# Patient Record
Sex: Male | Born: 1950
Health system: Southern US, Community
[De-identification: ages and names within clinical notes are randomized; demographics above are authoritative.]

## PROBLEM LIST (undated history)

## (undated) DIAGNOSIS — M109 Gout, unspecified: Secondary | ICD-10-CM

## (undated) DIAGNOSIS — F1910 Other psychoactive substance abuse, uncomplicated: Secondary | ICD-10-CM

## (undated) DIAGNOSIS — F32A Depression, unspecified: Secondary | ICD-10-CM

## (undated) DIAGNOSIS — G4730 Sleep apnea, unspecified: Secondary | ICD-10-CM

## (undated) DIAGNOSIS — I499 Cardiac arrhythmia, unspecified: Secondary | ICD-10-CM

## (undated) DIAGNOSIS — M1990 Unspecified osteoarthritis, unspecified site: Secondary | ICD-10-CM

## (undated) DIAGNOSIS — Z86010 Personal history of colonic polyps: Secondary | ICD-10-CM

## (undated) DIAGNOSIS — F329 Major depressive disorder, single episode, unspecified: Secondary | ICD-10-CM

## (undated) DIAGNOSIS — K7030 Alcoholic cirrhosis of liver without ascites: Secondary | ICD-10-CM

## (undated) DIAGNOSIS — E119 Type 2 diabetes mellitus without complications: Secondary | ICD-10-CM

## (undated) DIAGNOSIS — I10 Essential (primary) hypertension: Secondary | ICD-10-CM

## (undated) DIAGNOSIS — C801 Malignant (primary) neoplasm, unspecified: Secondary | ICD-10-CM

## (undated) DIAGNOSIS — T7840XA Allergy, unspecified, initial encounter: Secondary | ICD-10-CM

## (undated) DIAGNOSIS — R918 Other nonspecific abnormal finding of lung field: Secondary | ICD-10-CM

## (undated) DIAGNOSIS — I4891 Unspecified atrial fibrillation: Secondary | ICD-10-CM

## (undated) DIAGNOSIS — E7800 Pure hypercholesterolemia, unspecified: Secondary | ICD-10-CM

## (undated) HISTORY — DX: Other psychoactive substance abuse, uncomplicated: F19.10

## (undated) HISTORY — DX: Allergy, unspecified, initial encounter: T78.40XA

## (undated) HISTORY — DX: Essential (primary) hypertension: I10

## (undated) HISTORY — PX: CARPAL TUNNEL RELEASE: SHX101

## (undated) HISTORY — PX: COLONOSCOPY: SHX174

## (undated) HISTORY — PX: BUNIONECTOMY: SHX129

## (undated) HISTORY — DX: Unspecified osteoarthritis, unspecified site: M19.90

## (undated) HISTORY — DX: Pure hypercholesterolemia, unspecified: E78.00

## (undated) HISTORY — PX: ANKLE SURGERY: SHX546

## (undated) HISTORY — DX: Personal history of colonic polyps: Z86.010

## (undated) HISTORY — DX: Depression, unspecified: F32.A

## (undated) HISTORY — DX: Sleep apnea, unspecified: G47.30

## (undated) HISTORY — DX: Major depressive disorder, single episode, unspecified: F32.9

---
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
## 1989-04-12 HISTORY — PX: VASECTOMY: SHX75

## 2004-06-03 ENCOUNTER — Ambulatory Visit: Payer: Self-pay | Admitting: Internal Medicine

## 2004-09-24 ENCOUNTER — Ambulatory Visit: Payer: Self-pay | Admitting: Internal Medicine

## 2004-10-19 ENCOUNTER — Ambulatory Visit: Payer: Self-pay | Admitting: Internal Medicine

## 2004-12-17 ENCOUNTER — Ambulatory Visit: Payer: Self-pay | Admitting: Internal Medicine

## 2004-12-18 ENCOUNTER — Ambulatory Visit (HOSPITAL_BASED_OUTPATIENT_CLINIC_OR_DEPARTMENT_OTHER): Admission: RE | Admit: 2004-12-18 | Discharge: 2004-12-18 | Payer: Self-pay | Admitting: Internal Medicine

## 2004-12-22 ENCOUNTER — Ambulatory Visit: Payer: Self-pay | Admitting: Internal Medicine

## 2004-12-24 ENCOUNTER — Ambulatory Visit: Payer: Self-pay | Admitting: Pulmonary Disease

## 2004-12-29 ENCOUNTER — Ambulatory Visit: Payer: Self-pay | Admitting: Internal Medicine

## 2005-04-02 ENCOUNTER — Ambulatory Visit: Payer: Self-pay | Admitting: Internal Medicine

## 2006-03-15 ENCOUNTER — Ambulatory Visit: Payer: Self-pay | Admitting: Internal Medicine

## 2006-05-23 ENCOUNTER — Ambulatory Visit: Payer: Self-pay | Admitting: Internal Medicine

## 2006-09-27 ENCOUNTER — Ambulatory Visit: Payer: Self-pay | Admitting: Internal Medicine

## 2006-09-27 LAB — CONVERTED CEMR LAB
ALT: 24 units/L (ref 0–40)
AST: 21 units/L (ref 0–37)
Albumin: 4 g/dL (ref 3.5–5.2)
Alkaline Phosphatase: 59 units/L (ref 39–117)
BUN: 26 mg/dL — ABNORMAL HIGH (ref 6–23)
Basophils Absolute: 0 10*3/uL (ref 0.0–0.1)
Basophils Relative: 0.6 % (ref 0.0–1.0)
Bilirubin, Direct: 0.1 mg/dL (ref 0.0–0.3)
CO2: 30 meq/L (ref 19–32)
Calcium: 9.1 mg/dL (ref 8.4–10.5)
Chloride: 109 meq/L (ref 96–112)
Cholesterol: 202 mg/dL (ref 0–200)
Creatinine, Ser: 1 mg/dL (ref 0.4–1.5)
Direct LDL: 136.9 mg/dL
Eosinophils Absolute: 0.2 10*3/uL (ref 0.0–0.6)
Eosinophils Relative: 3 % (ref 0.0–5.0)
GFR calc Af Amer: 99 mL/min
GFR calc non Af Amer: 82 mL/min
Glucose, Bld: 96 mg/dL (ref 70–99)
HCT: 41.1 % (ref 39.0–52.0)
HDL: 45.4 mg/dL (ref >39.0)
Hemoglobin: 14.3 g/dL (ref 13.0–17.0)
Lymphocytes Relative: 25.4 % (ref 12.0–46.0)
MCHC: 34.8 g/dL (ref 30.0–36.0)
MCV: 90.2 fL (ref 78.0–100.0)
Monocytes Absolute: 0.7 10*3/uL (ref 0.2–0.7)
Monocytes Relative: 9.2 % (ref 3.0–11.0)
Neutro Abs: 5.1 10*3/uL (ref 1.4–7.7)
Neutrophils Relative %: 61.8 % (ref 43.0–77.0)
PSA: 1.78 ng/mL (ref 0.10–4.00)
Platelets: 177 10*3/uL (ref 150–400)
Potassium: 4.1 meq/L (ref 3.5–5.1)
RBC: 4.55 M/uL (ref 4.22–5.81)
RDW: 12.6 % (ref 11.5–14.6)
Sodium: 144 meq/L (ref 135–145)
TSH: 0.72 microintl units/mL (ref 0.35–5.50)
Total Bilirubin: 0.3 mg/dL (ref 0.3–1.2)
Total CHOL/HDL Ratio: 4.4
Total Protein: 6.6 g/dL (ref 6.0–8.3)
Triglycerides: 129 mg/dL (ref 0–149)
VLDL: 26 mg/dL (ref 0–40)
WBC: 8 10*3/uL (ref 4.5–10.5)

## 2006-10-11 ENCOUNTER — Ambulatory Visit: Payer: Self-pay | Admitting: Internal Medicine

## 2006-10-20 DIAGNOSIS — J309 Allergic rhinitis, unspecified: Secondary | ICD-10-CM | POA: Insufficient documentation

## 2006-10-20 DIAGNOSIS — G4730 Sleep apnea, unspecified: Secondary | ICD-10-CM | POA: Insufficient documentation

## 2006-10-21 ENCOUNTER — Ambulatory Visit: Payer: Self-pay | Admitting: Internal Medicine

## 2006-12-26 ENCOUNTER — Ambulatory Visit: Payer: Self-pay | Admitting: Internal Medicine

## 2006-12-26 DIAGNOSIS — F329 Major depressive disorder, single episode, unspecified: Secondary | ICD-10-CM | POA: Insufficient documentation

## 2006-12-28 ENCOUNTER — Ambulatory Visit: Payer: Self-pay | Admitting: Internal Medicine

## 2007-02-28 ENCOUNTER — Telehealth: Payer: Self-pay | Admitting: Internal Medicine

## 2007-11-03 ENCOUNTER — Ambulatory Visit: Payer: Self-pay | Admitting: Internal Medicine

## 2007-12-26 ENCOUNTER — Encounter: Payer: Self-pay | Admitting: Internal Medicine

## 2008-04-12 HISTORY — PX: KNEE ARTHROSCOPY: SHX127

## 2008-06-03 ENCOUNTER — Ambulatory Visit: Payer: Self-pay | Admitting: Internal Medicine

## 2008-06-03 DIAGNOSIS — I10 Essential (primary) hypertension: Secondary | ICD-10-CM | POA: Insufficient documentation

## 2008-06-13 ENCOUNTER — Emergency Department (HOSPITAL_COMMUNITY): Admission: EM | Admit: 2008-06-13 | Discharge: 2008-06-13 | Payer: Self-pay | Admitting: Emergency Medicine

## 2008-06-13 ENCOUNTER — Encounter: Payer: Self-pay | Admitting: Internal Medicine

## 2008-06-13 ENCOUNTER — Inpatient Hospital Stay (HOSPITAL_COMMUNITY): Admission: EM | Admit: 2008-06-13 | Discharge: 2008-06-14 | Payer: Self-pay | Admitting: Family Medicine

## 2008-06-24 ENCOUNTER — Encounter (HOSPITAL_COMMUNITY): Admission: RE | Admit: 2008-06-24 | Discharge: 2008-07-08 | Payer: Self-pay | Admitting: Urology

## 2008-07-25 ENCOUNTER — Encounter: Payer: Self-pay | Admitting: Internal Medicine

## 2008-07-31 ENCOUNTER — Telehealth (INDEPENDENT_AMBULATORY_CARE_PROVIDER_SITE_OTHER): Payer: Self-pay | Admitting: *Deleted

## 2008-08-08 ENCOUNTER — Ambulatory Visit: Payer: Self-pay | Admitting: Internal Medicine

## 2008-08-09 LAB — CONVERTED CEMR LAB
ALT: 29 units/L (ref 0–53)
Bilirubin, Direct: 0.1 mg/dL (ref 0.0–0.3)
Total Bilirubin: 0.9 mg/dL (ref 0.3–1.2)
Total Protein: 7.3 g/dL (ref 6.0–8.3)

## 2009-02-13 ENCOUNTER — Telehealth: Payer: Self-pay | Admitting: Internal Medicine

## 2009-02-14 ENCOUNTER — Ambulatory Visit: Payer: Self-pay | Admitting: Internal Medicine

## 2009-02-14 LAB — CONVERTED CEMR LAB
BUN: 18 mg/dL (ref 6–23)
Creatinine, Ser: 1 mg/dL (ref 0.4–1.5)

## 2009-02-18 ENCOUNTER — Ambulatory Visit: Payer: Self-pay | Admitting: Cardiology

## 2009-02-24 ENCOUNTER — Telehealth: Payer: Self-pay | Admitting: Internal Medicine

## 2009-06-04 ENCOUNTER — Ambulatory Visit: Payer: Self-pay | Admitting: Internal Medicine

## 2009-06-04 DIAGNOSIS — R5381 Other malaise: Secondary | ICD-10-CM

## 2009-06-04 DIAGNOSIS — R5383 Other fatigue: Secondary | ICD-10-CM

## 2009-06-11 LAB — CONVERTED CEMR LAB
Alkaline Phosphatase: 67 units/L (ref 39–117)
Basophils Absolute: 0 10*3/uL (ref 0.0–0.1)
Basophils Relative: 0.5 % (ref 0.0–3.0)
Bilirubin, Direct: 0.2 mg/dL (ref 0.0–0.3)
CO2: 32 meq/L (ref 19–32)
Calcium: 9.5 mg/dL (ref 8.4–10.5)
Cholesterol: 224 mg/dL — ABNORMAL HIGH (ref 0–200)
Creatinine, Ser: 1.1 mg/dL (ref 0.4–1.5)
Direct LDL: 150.6 mg/dL
Eosinophils Absolute: 0.1 10*3/uL (ref 0.0–0.7)
HCT: 42 % (ref 39.0–52.0)
Hemoglobin: 14.1 g/dL (ref 13.0–17.0)
Lymphs Abs: 1.6 10*3/uL (ref 0.7–4.0)
MCHC: 33.5 g/dL (ref 30.0–36.0)
MCV: 90.8 fL (ref 78.0–100.0)
Monocytes Absolute: 0.5 10*3/uL (ref 0.1–1.0)
Neutro Abs: 3.5 10*3/uL (ref 1.4–7.7)
RBC: 4.63 M/uL (ref 4.22–5.81)
RDW: 12 % (ref 11.5–14.6)
Sodium: 144 meq/L (ref 135–145)
Total Bilirubin: 0.5 mg/dL (ref 0.3–1.2)
Total CHOL/HDL Ratio: 4
Total Protein: 7.6 g/dL (ref 6.0–8.3)
VLDL: 41 mg/dL — ABNORMAL HIGH (ref 0.0–40.0)

## 2009-06-13 ENCOUNTER — Ambulatory Visit: Payer: Self-pay | Admitting: Internal Medicine

## 2009-06-13 DIAGNOSIS — E291 Testicular hypofunction: Secondary | ICD-10-CM

## 2009-06-13 LAB — CONVERTED CEMR LAB
FSH: 4.8 milliintl units/mL (ref 1.4–18.1)
LH: 3.5 milliintl units/mL (ref 1.5–9.3)
Prolactin: 4.5 ng/mL (ref 2.1–17.1)

## 2009-07-10 ENCOUNTER — Telehealth: Payer: Self-pay | Admitting: Internal Medicine

## 2009-07-21 ENCOUNTER — Ambulatory Visit: Payer: Self-pay | Admitting: Internal Medicine

## 2009-08-19 ENCOUNTER — Ambulatory Visit: Payer: Self-pay | Admitting: Internal Medicine

## 2009-09-22 ENCOUNTER — Telehealth: Payer: Self-pay | Admitting: *Deleted

## 2009-09-25 ENCOUNTER — Ambulatory Visit: Payer: Self-pay | Admitting: Internal Medicine

## 2009-10-24 ENCOUNTER — Ambulatory Visit: Payer: Self-pay | Admitting: Internal Medicine

## 2009-11-04 ENCOUNTER — Telehealth: Payer: Self-pay | Admitting: Internal Medicine

## 2009-11-05 ENCOUNTER — Telehealth (INDEPENDENT_AMBULATORY_CARE_PROVIDER_SITE_OTHER): Payer: Self-pay | Admitting: *Deleted

## 2010-01-02 ENCOUNTER — Ambulatory Visit: Payer: Self-pay | Admitting: Internal Medicine

## 2010-02-03 ENCOUNTER — Ambulatory Visit: Payer: Self-pay | Admitting: Internal Medicine

## 2010-04-20 ENCOUNTER — Ambulatory Visit (HOSPITAL_COMMUNITY)
Admission: RE | Admit: 2010-04-20 | Discharge: 2010-04-20 | Payer: Self-pay | Source: Home / Self Care | Attending: Internal Medicine | Admitting: Internal Medicine

## 2010-05-04 ENCOUNTER — Encounter: Payer: Self-pay | Admitting: Internal Medicine

## 2010-05-12 NOTE — Assessment & Plan Note (Signed)
Summary: consult re: fatigue and depression med/cjr   Vital Signs:  Patient profile:   60 year old male Weight:      211 pounds Temp:     98.9 degrees F oral Pulse rate:   72 / minute BP sitting:   120 / 80  (left arm) Cuff size:   regular  Vitals Entered By: Romualdo Bolk, CMA (AAMA) (January 02, 2010 8:30 AM) CC: Consult on fatigue    CC:  Consult on fatigue .  History of Present Illness: fatigue says he is tired---working 2nd and 3rd shifts. 12 hours daily 5-6 days/week Has OSA has no vacation and feels too fatigued to work  All other systems reviewed and were negative   Preventive Screening-Counseling & Management  Alcohol-Tobacco     Alcohol drinks/day: 4     Smoking Status: quit     Year Quit: 1985  Current Medications (verified): 1)  Cialis 20 Mg Tabs (Tadalafil) .... Take 1/2 - 1 Once Daily As Directed As Needed 2)  Citalopram Hydrobromide 20 Mg Tabs (Citalopram Hydrobromide) .... Take 1/ Tablet By Mouth Once A Day 3)  Wellbutrin Sr 150 Mg Tb12 (Bupropion Hcl) .... Take 1 Tablet By Mouth Twice A Day 4)  Fluticasone Propionate 50 Mcg/act Susp (Fluticasone Propionate) .... Inhale 1 Spray Into Each Nostril Once Daily. 5)  Fexofenadine Hcl 180 Mg Tabs (Fexofenadine Hcl) .... Take 1 Tablet By Mouth Once A Day 6)  Aspirin Adult Low Strength 81 Mg Tbec (Aspirin) .... One By Mouth Daily 7)  Lisinopril 20 Mg Tabs (Lisinopril) .Marland Kitchen.. 1 Tablet By Mouth Daily 8)  Testosterone Enanthate 200 Mg/ml Oil (Testosterone Enanthate) .... Inject 200 Mg. Monthly  Allergies (verified): No Known Drug Allergies  Physical Exam  General:  alert and well-developed.   Psych:  normally interactive and good eye contact.     Impression & Recommendations:  Problem # 1:  FATIGUE (ICD-780.79) ongoing will write out of work for 7 days RTW 01/12/10 If sxs persist will need furhter eval or possibly psych referral  Problem # 2:  HYPERTENSION (ICD-401.9)  His updated medication list  for this problem includes:    Lisinopril 20 Mg Tabs (Lisinopril) .Marland Kitchen... 1 tablet by mouth daily  BP today: 120/80 Prior BP: 144/80 (06/04/2009)  Labs Reviewed: K+: 4.4 (06/04/2009) Creat: : 1.1 (06/04/2009)   Chol: 224 (06/04/2009)   HDL: 58.80 (06/04/2009)   LDL: DEL (09/27/2006)   TG: 205.0 (06/04/2009)  Complete Medication List: 1)  Cialis 20 Mg Tabs (Tadalafil) .... Take 1/2 - 1 once daily as directed as needed 2)  Citalopram Hydrobromide 20 Mg Tabs (Citalopram hydrobromide) .... Take 1/ tablet by mouth once a day 3)  Wellbutrin Sr 150 Mg Tb12 (Bupropion hcl) .... Take 1 tablet by mouth twice a day 4)  Fluticasone Propionate 50 Mcg/act Susp (Fluticasone propionate) .... Inhale 1 spray into each nostril once daily. 5)  Fexofenadine Hcl 180 Mg Tabs (Fexofenadine hcl) .... Take 1 tablet by mouth once a day 6)  Aspirin Adult Low Strength 81 Mg Tbec (Aspirin) .... One by mouth daily 7)  Lisinopril 20 Mg Tabs (Lisinopril) .Marland Kitchen.. 1 tablet by mouth daily 8)  Testosterone Enanthate 200 Mg/ml Oil (Testosterone enanthate) .... Inject 200 mg. monthly

## 2010-05-12 NOTE — Progress Notes (Signed)
Summary: refills citalopram  Phone Note Call from Patient Call back at [facility] Phone [phone]   Summary of Call: 30 day Rx Citalopram 20 mg one daily to [facility] and written Rx 90 day to send to mailorder to pickup Thurs when he comes for injection.   Initial call taken by: Rudy Jew, RN,  September 22, 2009 12:16 PM  Follow-up for Phone Call        List has 1/2 tab once daily since 05/2009 visit.  Pt states that did not work for him so has gone back to Take 1 tablet by mouth once a day with wellbutrin Take 1 tablet by mouth two times a day .  Wants #46mcalled to [facility] and will pick up Rx for mail order of citalopram on Thurs when gets injection.  Follow-up by: Gladis Riffle, RN,  September 22, 2009 2:10 PM  Additional Follow-up for Phone Call Additional follow up Details #1::        see Rx.  Rx done [facility] was withdrawn via phone call to pharmacist. Additional Follow-up by: Gladis Riffle, RN,  September 22, 2009 2:13 PM    Additional Follow-up for Phone Call Additional follow up Details #2::    Rx ready for pick up. Follow-up by: Gladis Riffle, RN,  September 23, 2009 8:03 AM  New/Updated Medications: CITALOPRAM HYDROBROMIDE 20 MG TABS (CITALOPRAM HYDROBROMIDE) Take 1/ tablet by mouth once a day Prescriptions: CITALOPRAM HYDROBROMIDE 20 MG TABS (CITALOPRAM HYDROBROMIDE) Take 1/ tablet by mouth once a day  #90 x 3   Entered by:   Gladis Riffle, RN   Authorized by:   Birdie Sons MD   Signed by:   Gladis Riffle, RN on 09/22/2009   Method used:   Print then Give to Patient   RxID:   [id number] CITALOPRAM HYDROBROMIDE 20 MG TABS (CITALOPRAM HYDROBROMIDE) Take 1/ tablet by mouth once a day  #30 x 0   Entered by:   Gladis Riffle, RN   Authorized by:   Birdie Sons MD   Signed by:   Gladis Riffle, RN on 09/22/2009   Method used:   Electronically to        [facility]* (retail)       [address] [place], [place]  [address]      Ph: [phone]       Fax: [phone]   RxID:   [id number]

## 2010-05-12 NOTE — Progress Notes (Signed)
Summary: citalopram refill  Prescriptions: CITALOPRAM HYDROBROMIDE 20 MG TABS (CITALOPRAM HYDROBROMIDE) Take 1/ tablet by mouth once a day  #90 x 1   Entered by:   Kern Reap CMA (AAMA)   Authorized by:   Birdie Sons MD   Signed by:   Kern Reap CMA (AAMA) on 11/04/2009   Method used:   Electronically to        [facility]* (retail)       [address]       [place], [place]  [address]       Ph: [phone]       Fax: [phone]   RxID:   [id number]

## 2010-05-12 NOTE — Progress Notes (Signed)
Summary: Written Rxs for sig tom 7-28  Phone Note Call from Patient Call back at [facility] Phone [phone]   Summary of Call: Needs 2 written Rxs to mail off himself to [facility] drug company.  1)Lisinopril 20 mg one daily 2) generic Wellbutrin 150mg  one two times a day.  Please call him when he can pick them up.   Initial call taken by: Rudy Jew, RN,  November 05, 2009 1:23 PM  Follow-up for Phone Call        Rxs available tomorrow for Dr. Cato Mulligan to sign.  Tore up ones with nurse's name.   Follow-up by: Rudy Jew, RN,  November 05, 2009 1:32 PM  Additional Follow-up for Phone Call Additional follow up Details #1::        Fleet Contras left message that the prescrition is not generic.  Additional Follow-up by: Lynann Beaver CMA,  November 06, 2009 11:13 AM    Additional Follow-up for Phone Call Additional follow up Details #2::    left message on machine for patient that rx has been mailed to the home address. Follow-up by: Kern Reap CMA Duncan Dull),  November 07, 2009 12:55 PM  Prescriptions: LISINOPRIL 20 MG TABS (LISINOPRIL) 1 tablet by mouth daily  #90 Tablet x 3   Entered by:   Rudy Jew, RN   Authorized by:   Birdie Sons MD   Signed by:   Rudy Jew, RN on 11/05/2009   Method used:   Print then Give to Patient   RxID:   [id number] WELLBUTRIN SR 150 MG TB12 (BUPROPION HCL) Take 1 tablet by mouth twice a day  #180 Tablet x 3   Entered by:   Rudy Jew, RN   Authorized by:   Birdie Sons MD   Signed by:   Rudy Jew, RN on 11/05/2009   Method used:   Print then Give to Patient   RxID:   [id number] LISINOPRIL 20 MG TABS (LISINOPRIL) 1 tablet by mouth daily  #90 Tablet x 3   Entered and Authorized by:   Rudy Jew, RN   Signed by:   Rudy Jew, RN on 11/05/2009   Method used:   Print then Give to Patient   RxID:   [id number] WELLBUTRIN SR 150 MG TB12 (BUPROPION HCL) Take 1 tablet by mouth twice a day   #180 Tablet x 3   Entered and Authorized by:   Rudy Jew, RN   Signed by:   Rudy Jew, RN on 11/05/2009   Method used:   Print then Give to Patient   RxID:   [id number]

## 2010-05-12 NOTE — Assessment & Plan Note (Signed)
Summary: testosterone inj/pt coming in at 4pm per Ellen/cjr  Nurse Visit   Allergies: No Known Drug Allergies  Medication Administration  Injection # 1:    Medication: Testosterone Cypionat 200mg  ing    Diagnosis: TESTICULAR HYPOFUNCTION (ICD-257.2)    Route: IM    Site: RUOQ gluteus    Exp Date: 02/11/2011    Lot #: [id number]    Mfr: paddock    Patient tolerated injection without complications    Given by: Gladis Riffle, RN (July 21, 2009 4:18 PM)  Orders Added: 1)  Admin of patients own med IM/SQ [96372M]   Medication Administration  Injection # 1:    Medication: Testosterone Cypionat 200mg  ing    Diagnosis: TESTICULAR HYPOFUNCTION (ICD-257.2)    Route: IM    Site: RUOQ gluteus    Exp Date: 02/11/2011    Lot #: [id number]    Mfr: paddock    Patient tolerated injection without complications    Given by: Gladis Riffle, RN (July 21, 2009 4:18 PM)  Orders Added: 1)  Admin of patients own med IM/SQ [phone]

## 2010-05-12 NOTE — Assessment & Plan Note (Signed)
Summary: med check-testosterone check//ccm   Vital Signs:  Patient profile:   60 year old male Weight:      214 pounds Temp:     98.3 degrees F oral Pulse rate:   68 / minute Resp:     14 per minute BP sitting:   144 / 80  (left arm) Cuff size:   regular  Vitals Entered By: Gladis Riffle, RN (June 04, 2009 11:13 AM)  Serial Vital Signs/Assessments:  Time      Position  BP       Pulse  Resp  Temp     By                     148/94                         Birdie Sons MD  CC: med review--discuss depression meds and testosterone--c/o falling asleep during the day  Is Patient Diabetic? No Comments BP 150/100 at urgent care the other day   CC:  med review--discuss depression meds and testosterone--c/o falling asleep during the day .  History of Present Illness: UCC---BP 150/100 no sxs of htn---tolerating BP meds no CP,SOB,PND  hypersomnia---he has sleep apnea---uses CPAP some of the time (says hypersomnia resulted in him being laid off).   MOod---he admits to depressed mood---tried some lexapro---seemed to help (note already on citalopram)  fatigue---he wonders about having testosterone checked---hx of testosterone deficiency  All other systems reviewed and were negative    Preventive Screening-Counseling & Management  Alcohol-Tobacco     Alcohol drinks/day: 4     Smoking Status: quit     Year Quit: 1985  Current Problems (verified): 1)  Fatigue  (ICD-780.79) 2)  Hypertension  (ICD-401.9) 3)  Depression  (ICD-311) 4)  Sleep Apnea  (ICD-780.57) 5)  Allergic Rhinitis  (ICD-477.9)  Medications Prior to Update: 1)  Cialis 20 Mg Tabs (Tadalafil) .... Take 1/2 - 1 Once Daily As Directed As Needed 2)  Citalopram Hydrobromide 20 Mg Tabs (Citalopram Hydrobromide) .... Take 1 Tablet By Mouth Once A Day 3)  Wellbutrin Sr 150 Mg Tb12 (Bupropion Hcl) .... Take 1 Tablet By Mouth Twice A Day 4)  Fluticasone Propionate 50 Mcg/act Susp (Fluticasone Propionate) .... Inhale 1  Spray Into Each Nostril Once Daily. 5)  Fexofenadine Hcl 180 Mg Tabs (Fexofenadine Hcl) .... Take 1 Tablet By Mouth Once A Day 6)  Aspirin Adult Low Strength 81 Mg Tbec (Aspirin) .... One By Mouth Daily 7)  Lisinopril 10 Mg  Tabs (Lisinopril) .... Take 1 Tab By Mouth Daily  Current Medications (verified): 1)  Cialis 20 Mg Tabs (Tadalafil) .... Take 1/2 - 1 Once Daily As Directed As Needed 2)  Citalopram Hydrobromide 20 Mg Tabs (Citalopram Hydrobromide) .... Take 1/2  Tablet By Mouth Once A Day 3)  Wellbutrin Sr 150 Mg Tb12 (Bupropion Hcl) .... Take 1 Tablet By Mouth Twice A Day 4)  Fluticasone Propionate 50 Mcg/act Susp (Fluticasone Propionate) .... Inhale 1 Spray Into Each Nostril Once Daily. 5)  Fexofenadine Hcl 180 Mg Tabs (Fexofenadine Hcl) .... Take 1 Tablet By Mouth Once A Day 6)  Aspirin Adult Low Strength 81 Mg Tbec (Aspirin) .... One By Mouth Daily 7)  Lisinopril 10 Mg  Tabs (Lisinopril) .... Take 1 Tab By Mouth Daily  Allergies (verified): No Known Drug Allergies  Past History:  Past Medical History: Last updated: 06/03/2008 Allergic rhinitis mood disorder OSA-CPAP Hep A &  B series complete 2005 per pt Depression Hypertension  Past Surgical History: Last updated: 10/20/2006 Tonsillectomy, adenoidectomy  as child Vasectomy  Family History: Last updated: 06/03/2008 father---HTN Family History Hypertension mother--no problems at age 73, breast and stomach CA brother pancreatic CA  Social History: Last updated: 08/08/2008 Married Former Smoker--quit 1985 Alcohol use-yes  Risk Factors: Alcohol Use: 4 (06/04/2009)  Risk Factors: Smoking Status: quit (06/04/2009)  Physical Exam  General:  Well-developed,well-nourished,in no acute distress; alert,appropriate and cooperative throughout examination Eyes:  pupils equal and pupils round.   Neck:  No deformities, masses, or tenderness noted. Lungs:  Normal respiratory effort, chest expands symmetrically. Lungs  are clear to auscultation, no crackles or wheezes. Heart:  normal rate and regular rhythm.   Abdomen:  Bowel sounds positive,abdomen soft and non-tender without masses, organomegaly or hernias noted. Msk:  No deformity or scoliosis noted of thoracic or lumbar spine.   Neurologic:  alert & oriented X3 and gait normal.   Skin:  turgor normal and color normal.   Psych:  memory intact for recent and remote and good eye contact.     Impression & Recommendations:  Problem # 1:  HYPERTENSION (ICD-401.9) i've asked him to monitor goal BP < 135/85 His updated medication list for this problem includes:    Lisinopril 20 Mg Tabs (Lisinopril) .Marland Kitchen... 1 tablet by mouth daily  Orders: TLB-Lipid Panel (80061-LIPID)  BP today: 144/80 Prior BP: 156/82 (08/08/2008)  Labs Reviewed: K+: 4.1 (09/27/2006) Creat: : 1.0 (02/14/2009)   Chol: 202 (09/27/2006)   HDL: 45.4 (09/27/2006)   LDL: DEL (09/27/2006)   TG: 129 (09/27/2006)  Problem # 2:  SLEEP APNEA (ICD-780.57) not tolerating mask terribly well Orders: Pulmonary Referral (Pulmonary)  Problem # 3:  DEPRESSION (ICD-311)  His updated medication list for this problem includes:    Citalopram Hydrobromide 20 Mg Tabs (Citalopram hydrobromide) .Marland Kitchen... Take 1/2  tablet by mouth once a day    Wellbutrin Sr 150 Mg Tb12 (Bupropion hcl) .Marland Kitchen... Take 1 tablet by mouth twice a day  Problem # 4:  FATIGUE (ICD-780.79) needs further evaluation Orders: Venipuncture ([address]) Sedimentation Rate, non-automated (60[address]) TLB-BMP (Basic Metabolic Panel-BMET) (80048-METABOL) TLB-Hepatic/Liver Function Pnl (80076-HEPATIC) TLB-CBC Platelet - w/Differential (85025-CBCD) TLB-TSH (Thyroid Stimulating Hormone) (84443-TSH) TLB-Testosterone, Total (84403-TESTO)  Complete Medication List: 1)  Cialis 20 Mg Tabs (Tadalafil) .... Take 1/2 - 1 once daily as directed as needed 2)  Citalopram Hydrobromide 20 Mg Tabs (Citalopram hydrobromide) .... Take 1/2  tablet by mouth once a  day 3)  Wellbutrin Sr 150 Mg Tb12 (Bupropion hcl) .... Take 1 tablet by mouth twice a day 4)  Fluticasone Propionate 50 Mcg/act Susp (Fluticasone propionate) .... Inhale 1 spray into each nostril once daily. 5)  Fexofenadine Hcl 180 Mg Tabs (Fexofenadine hcl) .... Take 1 tablet by mouth once a day 6)  Aspirin Adult Low Strength 81 Mg Tbec (Aspirin) .... One by mouth daily 7)  Lisinopril 20 Mg Tabs (Lisinopril) .Marland Kitchen.. 1 tablet by mouth daily Prescriptions: LISINOPRIL 20 MG TABS (LISINOPRIL) 1 tablet by mouth daily  #90 x 3   Entered and Authorized by:   Birdie Sons MD   Signed by:   Birdie Sons MD on 06/04/2009   Method used:   Electronically to        [facility] [phone]* (retail)       [address]       [place], [place]  [address]  Ph: [phone] or [phone]       Fax: [phone]   RxID:   [id number]   Laboratory Results   Blood Tests     SED rate: 10 mm/hr  Comments: Rita Ohara  June 04, 2009 2:40 PM

## 2010-05-12 NOTE — Assessment & Plan Note (Signed)
Summary: TINGLING FEELING - BILATERAL HANDS // RS   Vital Signs:  Patient profile:   60 year old male Weight:      208 pounds Temp:     98.5 degrees F oral BP sitting:   148 / 94  (left arm) Cuff size:   large  Vitals Entered By: Alfred Levins, CMA (February 03, 2010 11:28 AM) CC: chronic rt arm pain and numbness   CC:  chronic rt arm pain and numbness.  History of Present Illness: Right hand/arm discomfort hand and arm tingles and is uncomfortable time.  occasionally has a "lump sensation" right axilla  Sxs are most bothersome at night, but will have sxs with driving.  Sxs sometimes interfere with his ability to "feel" small objects  occasionally notes some neck pain  All other systems reviewed and were negative except chronic hip and knee pain--unchanged  Current Medications (verified): 1)  Cialis 20 Mg Tabs (Tadalafil) .... Take 1/2 - 1 Once Daily As Directed As Needed 2)  Citalopram Hydrobromide 20 Mg Tabs (Citalopram Hydrobromide) .... Take 1/ Tablet By Mouth Once A Day 3)  Wellbutrin Sr 150 Mg Tb12 (Bupropion Hcl) .... Take 1 Tablet By Mouth Twice A Day 4)  Fluticasone Propionate 50 Mcg/act Susp (Fluticasone Propionate) .... Inhale 1 Spray Into Each Nostril Once Daily. 5)  Fexofenadine Hcl 180 Mg Tabs (Fexofenadine Hcl) .... Take 1 Tablet By Mouth Once A Day 6)  Aspirin Adult Low Strength 81 Mg Tbec (Aspirin) .... One By Mouth Daily 7)  Lisinopril 20 Mg Tabs (Lisinopril) .Marland Kitchen.. 1 Tablet By Mouth Daily  Allergies (verified): No Known Drug Allergies  Past History:  Past Medical History: Last updated: 06/03/2008 Allergic rhinitis mood disorder OSA-CPAP Hep A & B series complete 2005 per pt Depression Hypertension  Past Surgical History: Last updated: 10/20/2006 Tonsillectomy, adenoidectomy  as child Vasectomy  Family History: Last updated: 06/03/2008 father---HTN Family History Hypertension mother--no problems at age 64, breast and stomach CA brother  pancreatic CA  Social History: Last updated: 08/08/2008 Married Former Smoker--quit 1985 Alcohol use-yes  Risk Factors: Alcohol Use: 4 (01/02/2010)  Risk Factors: Smoking Status: quit (01/02/2010)  Physical Exam  General:  well-developed well-nourished male in no acute distress. HEENT exam atraumatic, normocephalic. Chest heart auscultation cardiac exam S1-S2 are regular extremities there is no clubbing cyanosis or edema. Tinel's and Phalen's signs are negative.   Impression & Recommendations:  Problem # 1:  R/O CARPAL TUNNEL SYNDROME, RIGHT (ICD-354.0) wrist brace if no success than NCS  Complete Medication List: 1)  Cialis 20 Mg Tabs (Tadalafil) .... Take 1/2 - 1 once daily as directed as needed 2)  Citalopram Hydrobromide 20 Mg Tabs (Citalopram hydrobromide) .... Take 1/ tablet by mouth once a day 3)  Wellbutrin Sr 150 Mg Tb12 (Bupropion hcl) .... Take 1 tablet by mouth twice a day 4)  Fluticasone Propionate 50 Mcg/act Susp (Fluticasone propionate) .... Inhale 1 spray into each nostril once daily. 5)  Fexofenadine Hcl 180 Mg Tabs (Fexofenadine hcl) .... Take 1 tablet by mouth once a day 6)  Aspirin Adult Low Strength 81 Mg Tbec (Aspirin) .... One by mouth daily 7)  Lisinopril 20 Mg Tabs (Lisinopril) .Marland Kitchen.. 1 tablet by mouth daily   Orders Added: 1)  Est. Patient Level III [[address]]

## 2010-05-12 NOTE — Progress Notes (Signed)
Summary: results of testosteron  Phone Note Call from Patient   Caller: Patient Call For: Birdie Sons MD Summary of Call: Pt wants to know if he is going to have any treatment for his low testosterone??? [phone] Initial call taken by: Lynann Beaver CMA,  July 10, 2009 12:48 PM  Follow-up for Phone Call        yes , I'k like him to start with injections testosterone 200mg  im monthly (call in rx)  see me 4 months Follow-up by: Birdie Sons MD,  July 10, 2009 12:55 PM  Additional Follow-up for Phone Call Additional follow up Details #1::        LMTCB Additional Follow-up by: Lynann Beaver CMA,  July 10, 2009 1:34 PM    New/Updated Medications: TESTOSTERONE ENANTHATE 200 MG/ML OIL (TESTOSTERONE ENANTHATE) Inject 200 mg. monthly Prescriptions: TESTOSTERONE ENANTHATE 200 MG/ML OIL (TESTOSTERONE ENANTHATE) Inject 200 mg. monthly  #11ml x 2   Entered by:   Lynann Beaver CMA   Authorized by:   Birdie Sons MD   Signed by:   Lynann Beaver CMA on 07/10/2009   Method used:   Telephoned to ...       [facility]* (retail)       [address]       [place], [place]  [address]       Ph: [phone]       Fax: [phone]   RxID:   [id number]  Pt. notified.

## 2010-05-12 NOTE — Assessment & Plan Note (Signed)
  Nurse Visit   Allergies: No Known Drug Allergies  Medication Administration  Injection # 1:    Medication: Testosterone Cypionat 200mg  ing    Diagnosis: TESTICULAR HYPOFUNCTION (ICD-257.2)    Route: IM    Site: R deltoid    Exp Date: 03/2011    Lot #: [id number]    Mfr: [email or web address]    Comments: PT BROUGHT IN HIS OWN MED    Patient tolerated injection without complications    Given by: Josph Macho RMA (October 24, 2009 2:09 PM)  Orders Added: 1)  Admin of Therapeutic Inj  intramuscular or subcutaneous [26948]

## 2010-05-12 NOTE — Assessment & Plan Note (Signed)
Summary: testosterone inj/et/pt rsc from bmp/cjr  Nurse Visit   Allergies: No Known Drug Allergies  Medication Administration  Injection # 1:    Medication: Testosterone Cypionat 200mg  ing    Diagnosis: TESTICULAR HYPOFUNCTION (ICD-257.2)    Route: IM    Exp Date: 02/11/2011    Lot #: [id number]    Mfr: paddock    Patient tolerated injection without complications    Given by: Gladis Riffle, RN (September 25, 2009 1:51 PM)  Orders Added: 1)  Admin of patients own med IM/SQ [96372M]   Medication Administration  Injection # 1:    Medication: Testosterone Cypionat 200mg  ing    Diagnosis: TESTICULAR HYPOFUNCTION (ICD-257.2)    Route: IM    Exp Date: 02/11/2011    Lot #: [id number]    Mfr: paddock    Patient tolerated injection without complications    Given by: Gladis Riffle, RN (September 25, 2009 1:51 PM)  Orders Added: 1)  Admin of patients own med IM/SQ [phone]

## 2010-05-12 NOTE — Assessment & Plan Note (Signed)
Summary: TESTOSTERONE INJ // RS  Nurse Visit   Allergies: No Known Drug Allergies  Medication Administration  Injection # 1:    Medication: Testosterone Cypionat 200mg  ing    Diagnosis: TESTICULAR HYPOFUNCTION (ICD-257.2)    Route: IM    Site: RUOQ gluteus    Exp Date: 03/13/2011    Lot #: [id number]    Mfr: Gaylyn Rong    Patient tolerated injection without complications    Given by: Gladis Riffle, RN (Aug 19, 2009 1:46 PM)  Orders Added: 1)  Admin of patients own med IM/SQ [96372M]   Medication Administration  Injection # 1:    Medication: Testosterone Cypionat 200mg  ing    Diagnosis: TESTICULAR HYPOFUNCTION (ICD-257.2)    Route: IM    Site: RUOQ gluteus    Exp Date: 03/13/2011    Lot #: [id number]    Mfr: Gaylyn Rong    Patient tolerated injection without complications    Given by: Gladis Riffle, RN (Aug 19, 2009 1:46 PM)  Orders Added: 1)  Admin of patients own med IM/SQ [phone]

## 2010-07-23 LAB — CBC
MCHC: 35.2 g/dL (ref 30.0–36.0)
MCV: 90.3 fL (ref 78.0–100.0)
Platelets: 155 10*3/uL (ref 150–400)
WBC: 7.2 10*3/uL (ref 4.0–10.5)

## 2010-07-23 LAB — BASIC METABOLIC PANEL
BUN: 16 mg/dL (ref 6–23)
BUN: 16 mg/dL (ref 6–23)
Calcium: 8.6 mg/dL (ref 8.4–10.5)
Chloride: 102 mEq/L (ref 96–112)
Creatinine, Ser: 1.1 mg/dL (ref 0.4–1.5)
Creatinine, Ser: 1.14 mg/dL (ref 0.4–1.5)
GFR calc non Af Amer: >60 mL/min (ref >60)
Glucose, Bld: 113 mg/dL — ABNORMAL HIGH (ref 70–99)
Potassium: 4.1 mEq/L (ref 3.5–5.1)
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
## 2010-08-28 NOTE — Procedures (Signed)
Darren Gay, Darren Gay                 ACCOUNT NO.:  [id number]   MEDICAL RECORD NO.:  [id number]          PATIENT TYPE:  OUT   LOCATION:  SLEEP CENTER                 FACILITY:  [facility]   PHYSICIAN:  Marcelyn Bruins, M.D. [facility] DATE OF BIRTH:  1950-05-05   DATE OF STUDY:  12/18/2004                              NOCTURNAL POLYSOMNOGRAM   REFERRING PHYSICIAN:  Dr. Birdie Sons.   DATE OF STUDY:  December 17, 2004.   INDICATION FOR STUDY:  Hypersomnia with sleep apnea. Epworth sleepiness  score: 14.   SLEEP ARCHITECTURE:  The patient had total sleep time of 395 minutes with  large amounts of slow wave sleep and very little REM. Sleep onset latency  was normal and REM onset latency was very prolonged. Sleep efficiency was  84%.   RESPIRATORY DATA:  The patient was found to have 78 obstructive events in  the first 213 minutes of sleep, giving him a respiratory disturbance index  of 22 events per hour. These were clearly worse in the supine position and  associated with loud snoring. By split night protocol, the patient was then  placed on a Respironics small comfort gel nasal mask and ultimately titrated  to a final pressure of 11 cm with adequate control both snoring and  obstructive events.   OXYGEN DATA:  The patient has O2 desaturation as low as 87%.   CARDIAC DATA:  No clinically significant cardiac arrhythmias.   MOVEMENT/PARASOMNIA:  The patient had 37 leg jerks noted with no clinically  significant sleep disruption.   IMPRESSION:  Moderate obstructive sleep apnea documented by split night  study with excellent response to C-PAP and 11 cm.           ______________________________  Marcelyn Bruins, M.D. [facility]  Diplomate, American Board of Sleep  Medicine     KC/MEDQ  D:  12/24/2004 15:16:05  T:  12/24/2004 21:50:53  Job:  [id number]

## 2010-10-02 ENCOUNTER — Ambulatory Visit (HOSPITAL_COMMUNITY)
Admission: RE | Admit: 2010-10-02 | Discharge: 2010-10-02 | Disposition: A | Discharge status: Home / Self Care | Payer: BC Managed Care – PPO | Source: Ambulatory Visit | Attending: Internal Medicine | Admitting: Internal Medicine

## 2010-10-02 ENCOUNTER — Other Ambulatory Visit (HOSPITAL_COMMUNITY): Payer: Self-pay | Admitting: Internal Medicine

## 2010-10-02 DIAGNOSIS — I82409 Acute embolism and thrombosis of unspecified deep veins of unspecified lower extremity: Secondary | ICD-10-CM

## 2010-10-02 DIAGNOSIS — M79609 Pain in unspecified limb: Secondary | ICD-10-CM | POA: Insufficient documentation

## 2011-05-31 ENCOUNTER — Other Ambulatory Visit (HOSPITAL_COMMUNITY): Payer: Self-pay | Admitting: Internal Medicine

## 2011-05-31 DIAGNOSIS — R748 Abnormal levels of other serum enzymes: Secondary | ICD-10-CM

## 2011-06-07 ENCOUNTER — Other Ambulatory Visit (HOSPITAL_COMMUNITY): Payer: BC Managed Care – PPO

## 2011-12-19 ENCOUNTER — Ambulatory Visit: Payer: 59

## 2011-12-19 ENCOUNTER — Ambulatory Visit (INDEPENDENT_AMBULATORY_CARE_PROVIDER_SITE_OTHER): Payer: 59 | Admitting: Family Medicine

## 2011-12-19 VITALS — BP 170/76 | HR 86 | Temp 98.9°F | Resp 16 | Ht 72.5 in | Wt 214.0 lb

## 2011-12-19 DIAGNOSIS — M25569 Pain in unspecified knee: Secondary | ICD-10-CM

## 2011-12-19 MED ORDER — OXAPROZIN 600 MG PO TABS: ORAL_TABLET | ORAL | Status: DC

## 2011-12-19 MED ORDER — HYDROCODONE-ACETAMINOPHEN 5-500 MG PO TABS: 1.0000 | ORAL_TABLET | ORAL | Status: AC | PRN

## 2011-12-19 NOTE — Addendum Note (Signed)
Addended by: Thelma Barge D on: 12/19/2011 05:02 PM   Modules accepted: Orders

## 2011-12-19 NOTE — Patient Instructions (Signed)
Take medicine as ordered.  Followup or go see orthopedist if the pain persists.
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
## 2012-01-20 ENCOUNTER — Ambulatory Visit (INDEPENDENT_AMBULATORY_CARE_PROVIDER_SITE_OTHER): Payer: 59 | Admitting: Family Medicine

## 2012-01-20 VITALS — BP 121/70 | HR 89 | Temp 98.1°F | Resp 16 | Ht 71.5 in | Wt 217.0 lb

## 2012-01-20 DIAGNOSIS — M79609 Pain in unspecified limb: Secondary | ICD-10-CM

## 2012-01-20 DIAGNOSIS — M79673 Pain in unspecified foot: Secondary | ICD-10-CM

## 2012-01-20 DIAGNOSIS — M722 Plantar fascial fibromatosis: Secondary | ICD-10-CM

## 2012-01-20 MED ORDER — TRAMADOL HCL 50 MG PO TABS: 50.0000 mg | ORAL_TABLET | Freq: Three times a day (TID) | ORAL | Status: DC | PRN

## 2012-01-20 MED ORDER — MELOXICAM 7.5 MG PO TABS: 7.5000 mg | ORAL_TABLET | Freq: Every day | ORAL | Status: DC

## 2012-01-20 NOTE — Progress Notes (Signed)
[facility]:  Office Visit  Chief Complaint:  Chief Complaint  Patient presents with  . Foot Pain    HPI: Darren Gay is a 61 y.o. male who complains of :  Acute on chronic exacerbation of his plant fasciitis. He has had foot pain for the last 2-3 days and has been out for the last 2 days because of it. He has seen Dr. Hanley Hays ( a podiatrist) who has done an extensive workup of his bilateral foot pain. He has had multiple xrays, night splints, PF exercises, wears insoles and has had steroid injections into his PF. They seem to not help and he gets flare ups periodically. This current one has his R foot swollen along the plant fascia. He denies any numbness or tingling. 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 He then has a difficulty time sleeping. This episode it has only affected his right foot but sometimes it is bilateral or his left foot. He took some left over hydrocodone for relief. Patient walks barefoot at home. He does what sounds like a semi- to very mediocre job of his PF exercises. He is here because he wants a work excuse for days he has missed, more potent pain meds, and also a referral to another podiatrist for PF/foot pain.  Past Medical History  Diagnosis Date  . Depression   . Hypertension    History reviewed. No pertinent past surgical history. History   Social History  . Marital Status: Married    Spouse Name: N/A    Number of Children: N/A  . Years of Education: N/A   Social History Main Topics  . Smoking status: Former Games developer  . Smokeless tobacco: Former Neurosurgeon    Quit date: 12/19/1983  . Alcohol Use: None  . Drug Use: None  . Sexually Active: None   Other Topics Concern  . None   Social History Narrative  . None   No family history on file. No Known Allergies Prior to Admission medications   Medication Sig Start Date End Date Taking? Authorizing Provider  Amlodipine-Valsartan-HCTZ  (EXFORGE HCT) 10-320-25 MG TABS Take 1 tablet by mouth daily.   Yes Historical Provider, MD  aspirin 81 MG tablet Take 81 mg by mouth daily.   Yes Historical Provider, MD  buPROPion (WELLBUTRIN SR) 150 MG 12 hr tablet Take 150 mg by mouth 2 (two) times daily.   Yes Historical Provider, MD  DULoxetine (CYMBALTA) 60 MG capsule Take 60 mg by mouth daily.   Yes Historical Provider, MD  Multiple Vitamins-Minerals (MULTIVITAMIN PO) Take 1 tablet by mouth daily.   Yes Historical Provider, MD  Omega-3 Fatty Acids (FISH OIL PO) Take 1 capsule by mouth daily.   Yes Historical Provider, MD  oxaprozin (DAYPRO) 600 MG tablet Take one twice daily for pain and inflammation 12/19/11 12/18/12  Peyton Najjar, MD     ROS: The patient denies fevers, chills, night sweats, unintentional weight loss, chest pain, palpitations, wheezing, dyspnea on exertion, nausea, vomiting, abdominal pain, dysuria, hematuria, melena, numbness, weakness, or tingling.   All other systems have been reviewed and were otherwise negative with the exception of those mentioned in the HPI and as above.    PHYSICAL EXAM: Filed Vitals:   01/20/12 1930  BP: 121/70  Pulse: 89  Temp: 98.1 F (36.7 C)  Resp: 16   Filed Vitals:   01/20/12 1930  Height: 5' 11.5" (  1.816 m)  Weight: 217 lb (98.431 kg)   Body mass index is 29.84 kg/(m^2).  General: Alert, no acute distress HEENT:  Normocephalic, atraumatic, oropharynx patent.  Cardiovascular:  Regular rate and rhythm, no rubs murmurs or gallops.  No Carotid bruits, radial pulse intact. No pedal edema.  Respiratory: Clear to auscultation bilaterally.  No wheezes, rales, or rhonchi.  No cyanosis, no use of accessory musculature GI: No organomegaly, abdomen is soft and non-tender, positive bowel sounds.  No masses. Skin: No rashes. Neurologic: Facial musculature symmetric. Psychiatric: Patient is appropriate throughout our interaction. Lymphatic: No cervical lymphadenopathy Musculoskeletal:  Gait intact. Right foot-fallen arch, + cyst on dorsal aspect of right foot near cuboid bones. ROm intact, sensation intact, 5/5 strength, + tenderness with palpation along PF. + DP and post tibial artery  LABS: Results for orders placed in visit on 06/13/09  CONVERTED [facility] LAB      Component Value Range   FSH 4.8  1.4-18.1 milliintl units/mL   LH 3.5  1.5-9.3 milliintl units/mL   Prolactin 4.5  2.1-17.1 ng/mL     EKG/XRAY:   Primary read interpreted by Dr. Conley Rolls at [facility]   ASSESSMENT/PLAN: Encounter Diagnoses  Name Primary?  . Plantar fasciitis of right foot Yes  . Foot pain    Work note given for 2 days 10/9-10/10 Rx Mobic and also Tramadol for inflammation and pain D/w patient techniques on icing, foot stretching exercise , wearing night splints and also to wear shoes at all time with PF. If he follows the cardinal rules for PF and still has pain with new meds and all then consider referring him to another podiatrist in town. He will let us know.      Macy Polio PHUONG, DO 01/21/2012 12:03 AM

## 2012-01-21 ENCOUNTER — Telehealth: Payer: Self-pay

## 2012-01-21 ENCOUNTER — Encounter: Payer: Self-pay | Admitting: Family Medicine

## 2012-01-21 DIAGNOSIS — M722 Plantar fascial fibromatosis: Secondary | ICD-10-CM | POA: Insufficient documentation

## 2012-01-21 NOTE — Patient Instructions (Signed)

## 2012-01-21 NOTE — Telephone Encounter (Signed)
PT STATES DR LE HAD GIVEN HIM A NOTE FOR 2 DAYS AND HE WOULD LIKE TO KNOW IF SHE COULD MAKE IT FOR TODAY ALSO AND THAT WAY HE CAN GO BACK TO WORK ON Monday PLEASE CALL [phone]

## 2012-03-21 ENCOUNTER — Ambulatory Visit (INDEPENDENT_AMBULATORY_CARE_PROVIDER_SITE_OTHER): Payer: 59 | Admitting: Family Medicine

## 2012-03-21 VITALS — BP 133/88 | HR 88 | Temp 98.0°F | Resp 17 | Ht 71.5 in | Wt 211.0 lb

## 2012-03-21 DIAGNOSIS — F1920 Other psychoactive substance dependence, uncomplicated: Secondary | ICD-10-CM
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
## 2012-08-16 ENCOUNTER — Other Ambulatory Visit (HOSPITAL_COMMUNITY): Payer: Self-pay | Admitting: Internal Medicine

## 2012-08-16 DIAGNOSIS — IMO0002 Reserved for concepts with insufficient information to code with codable children: Secondary | ICD-10-CM

## 2012-08-18 ENCOUNTER — Encounter (HOSPITAL_COMMUNITY): Payer: Self-pay

## 2012-08-18 ENCOUNTER — Ambulatory Visit (HOSPITAL_COMMUNITY)
Admission: RE | Admit: 2012-08-18 | Discharge: 2012-08-18 | Disposition: A | Discharge status: Home / Self Care | Payer: 59 | Source: Ambulatory Visit | Attending: Internal Medicine | Admitting: Internal Medicine

## 2012-08-18 ENCOUNTER — Other Ambulatory Visit (HOSPITAL_COMMUNITY): Payer: Self-pay | Admitting: Internal Medicine

## 2012-08-18 DIAGNOSIS — M5030 Other cervical disc degeneration, unspecified cervical region: Secondary | ICD-10-CM | POA: Insufficient documentation

## 2012-08-18 DIAGNOSIS — IMO0002 Reserved for concepts with insufficient information to code with codable children: Secondary | ICD-10-CM

## 2012-08-18 DIAGNOSIS — M79609 Pain in unspecified limb: Secondary | ICD-10-CM | POA: Insufficient documentation

## 2012-08-18 DIAGNOSIS — R209 Unspecified disturbances of skin sensation: Secondary | ICD-10-CM | POA: Insufficient documentation

## 2012-09-06 ENCOUNTER — Other Ambulatory Visit: Payer: Self-pay | Admitting: *Deleted

## 2012-09-06 ENCOUNTER — Ambulatory Visit (INDEPENDENT_AMBULATORY_CARE_PROVIDER_SITE_OTHER): Payer: BC Managed Care – PPO | Admitting: Diagnostic Neuroimaging

## 2012-09-06 ENCOUNTER — Encounter: Payer: Self-pay | Admitting: Diagnostic Neuroimaging

## 2012-09-06 VITALS — BP 120/78 | HR 64 | Temp 98.7°F | Ht 72.0 in | Wt 208.0 lb

## 2012-09-06 DIAGNOSIS — M542 Cervicalgia: Secondary | ICD-10-CM

## 2012-09-06 DIAGNOSIS — G5600 Carpal tunnel syndrome, unspecified upper limb: Secondary | ICD-10-CM

## 2012-09-06 NOTE — Progress Notes (Signed)
[facility]  PATIENT: Darren Gay DOB: [birth year]  REFERRING CLINICIAN: Hughie Closs HISTORY FROM: patient  REASON FOR VISIT: new consult   HISTORICAL  CHIEF COMPLAINT:  Chief Complaint  Patient presents with  . Numbness    arms, hands    HISTORY OF PRESENT ILLNESS:   62 year old right-handed male here for evaluation of progressive numbness and burning sensation in his neck radiating to his shoulders and arms for the past 2 years. Patient tried anti-inflammatories, chiropractic therapy with mild relief. No symptoms in his legs or torso. He had MRI of the cervical spine which showed degenerative changes. Also had EMG nerve conduction study which apparently showed some carpal tunnel syndrome. He was tried on Lyrica 75 mg at night without significant relief.  REVIEW OF SYSTEMS: Full 14 system review of systems performed and notable only for snoring impotence allergy depression numbness weakness going straight for restless legs.  ALLERGIES: No Known Allergies  HOME MEDICATIONS: Outpatient Prescriptions Prior to Visit  Medication Sig Dispense Refill  . Amlodipine-Valsartan-HCTZ (EXFORGE HCT) 10-320-25 MG TABS Take 1 tablet by mouth daily.      Marland Kitchen aspirin 81 MG tablet Take 81 mg by mouth daily.      Marland Kitchen buPROPion (WELLBUTRIN SR) 150 MG 12 hr tablet Take 150 mg by mouth 2 (two) times daily.      . Multiple Vitamins-Minerals (MULTIVITAMIN PO) Take 1 tablet by mouth daily.      . Omega-3 Fatty Acids (FISH OIL PO) Take 1 capsule by mouth daily.      . DULoxetine (CYMBALTA) 60 MG capsule Take 60 mg by mouth daily.      . meloxicam (MOBIC) 7.5 MG tablet Take 1 tablet (7.5 mg total) by mouth daily.  30 tablet  0  . oxaprozin (DAYPRO) 600 MG tablet Take one twice daily for pain and inflammation  30 tablet  1  . traMADol (ULTRAM) 50 MG tablet Take 1 tablet (50 mg total) by mouth every 8 (eight) hours as needed for pain.  30 tablet  0   No facility-administered medications  prior to visit.    PAST MEDICAL HISTORY: Past Medical History  Diagnosis Date  . Depression   . Hypertension   . Allergy   . High cholesterol     borderline    PAST SURGICAL HISTORY: Past Surgical History  Procedure Laterality Date  . Knee arthroscopy  2010  . Vasectomy  1991  . Tonsillectomy  1960    FAMILY HISTORY: Family History  Problem Relation Age of Onset  . Skin cancer Father   . Hypertension Father   . Breast cancer Mother   . Stomach cancer Mother   . Pancreatic cancer Brother     SOCIAL HISTORY:  History   Social History  . Marital Status: Married    Spouse Name: Velna Hatchet    Number of Children: 3  . Years of Education: 4y Trade   Occupational History  .      [employer]   Social History Main Topics  . Smoking status: Former Smoker -- 2.00 packs/day for 14 years  . Smokeless tobacco: Former Neurosurgeon    Quit date: 12/19/1983  . Alcohol Use: Yes     Comment: 2-3 beers daily  . Drug Use: No  . Sexually Active: No   Other Topics Concern  . Not on file   Social History Narrative   Pt lives at home with his family.   Caffeine Use: 2 cups daily  PHYSICAL EXAM  Filed Vitals:   09/06/12 1037  BP: 120/78  Pulse: 64  Temp: 98.7 F (37.1 C)  TempSrc: Oral  Height: 6' (1.829 m)  Weight: 208 lb (94.348 kg)    Not recorded    Body mass index is 28.2 kg/(m^2).  GENERAL EXAM: Patient is in no distress  CARDIOVASCULAR: Regular rate and rhythm, no murmurs, no carotid bruits  NEUROLOGIC: MENTAL STATUS: awake, alert, language fluent, comprehension intact, naming intact CRANIAL NERVE: no papilledema on fundoscopic exam, pupils equal and reactive to light, visual fields full to confrontation, extraocular muscles intact, no nystagmus, facial sensation and strength symmetric, uvula midline, shoulder shrug symmetric, tongue midline. MOTOR: ATROPHY OF RIGHT > LEFT APB; NEG PHALENS. BORDERLINE TINEL'S ON LEFT. Normal bulk and tone, full strength  in the BUE, BLE SENSORY: VIB 3 SEC AT TOES. NORMAL PP, TEMP VIB IN HANDS. COORDINATION: finger-nose-finger, fine finger movements normal REFLEXES: deep tendon reflexes present and symmetric; RIGHT ANKLE TRACE; LEFT ANKLE ABSENT. GAIT/STATION: narrow based gait; romberg is negative   DIAGNOSTIC DATA (LABS, IMAGING, TESTING) - I reviewed patient records, labs, notes, testing and imaging myself where available.  Lab Results  Component Value Date   WBC 5.7 06/04/2009   HGB 14.1 06/04/2009   HCT 42.0 06/04/2009   MCV 90.8 06/04/2009   PLT 201.0 06/04/2009      Component Value Date/Time   NA 144 06/04/2009 1136   K 4.4 06/04/2009 1136   CL 106 06/04/2009 1136   CO2 32 06/04/2009 1136   GLUCOSE 103* 06/04/2009 1136   BUN 14 06/04/2009 1136   CREATININE 1.1 06/04/2009 1136   CALCIUM 9.5 06/04/2009 1136   PROT 7.6 06/04/2009 1136   ALBUMIN 4.2 06/04/2009 1136   AST 32 06/04/2009 1136   ALT 46 06/04/2009 1136   ALKPHOS 67 06/04/2009 1136   BILITOT 0.5 06/04/2009 1136   GFRNONAA 72.82 06/04/2009 1136   GFRAA  Value: >60        The eGFR has been calculated using the MDRD equation. This calculation has not been validated in all clinical situations. eGFR's persistently <60 mL/min signify possible Chronic Kidney Disease. 06/14/2008 0518   Lab Results  Component Value Date   CHOL 224* 06/04/2009   HDL 58.80 06/04/2009   LDLDIRECT 150.6 06/04/2009   TRIG 205.0* 06/04/2009   CHOLHDL 4 06/04/2009   No results found for this basename: HGBA1C   No results found for this basename: VITAMINB12   Lab Results  Component Value Date   TSH 0.50 06/04/2009    MRI CERVICAL SPINE - degenerative changes.  EMG/NCS - ???   ASSESSMENT AND PLAN  62 y.o. year old male  has a past medical history of Depression; Hypertension; Allergy; and High cholesterol. here with neck pain, numbness and tingling in upper extremities.  Dx: neck pain/cervical radiculitis + carpal tunnel syndrome  PLAN: 1. Ask PCP to refer patient to  hand specialist for carpal tunnel syndrome 2. Get EMG/NCS report 3. PT eval 4. Consider increasing lyrica dose to 150 - 300mg  daily, or try gabapentin 300 - 900 mg daily 5. Ask PCP for B12, TSH , A1c results; if not done, then please order 6. No role for surgical management of MRI c-spine findings  No orders of the defined types were placed in this encounter.     Meds ordered this encounter  Medications  . Cholecalciferol (VITAMIN D3) 5000 UNITS TABS    Sig: Take 2 tablets by mouth daily.  Marland Kitchen pyridOXINE (VITAMIN  B-6) 100 MG tablet    Sig: Take 100 mg by mouth daily.  . cyanocobalamin 2000 MCG tablet    Sig: Take 2,000 mcg by mouth daily.     Suanne Marker, MD 09/06/2012, 11:19 AM Certified in Neurology, Neurophysiology and Neuroimaging  [facility] [address], [address] [place], [place] [address] [phone]

## 2012-09-06 NOTE — Patient Instructions (Signed)
I will request addl info and then consider referral to hand specialist.  Continue PT/chiropractic exercises.

## 2013-01-13 ENCOUNTER — Emergency Department (HOSPITAL_COMMUNITY): Payer: 59

## 2013-01-13 ENCOUNTER — Encounter (HOSPITAL_COMMUNITY): Payer: Self-pay | Admitting: *Deleted

## 2013-01-13 ENCOUNTER — Observation Stay (HOSPITAL_COMMUNITY)
Admission: EM | Admit: 2013-01-13 | Discharge: 2013-01-14 | Disposition: A | Discharge status: Home / Self Care | Payer: 59 | Attending: Family Medicine | Admitting: Family Medicine

## 2013-01-13 DIAGNOSIS — Z87891 Personal history of nicotine dependence: Secondary | ICD-10-CM | POA: Insufficient documentation

## 2013-01-13 DIAGNOSIS — R079 Chest pain, unspecified: Principal | ICD-10-CM

## 2013-01-13 DIAGNOSIS — F329 Major depressive disorder, single episode, unspecified: Secondary | ICD-10-CM | POA: Insufficient documentation

## 2013-01-13 DIAGNOSIS — E7800 Pure hypercholesterolemia, unspecified: Secondary | ICD-10-CM | POA: Insufficient documentation

## 2013-01-13 DIAGNOSIS — R209 Unspecified disturbances of skin sensation: Secondary | ICD-10-CM | POA: Insufficient documentation

## 2013-01-13 DIAGNOSIS — Z7982 Long term (current) use of aspirin: Secondary | ICD-10-CM | POA: Insufficient documentation

## 2013-01-13 DIAGNOSIS — R21 Rash and other nonspecific skin eruption: Secondary | ICD-10-CM

## 2013-01-13 DIAGNOSIS — R509 Fever, unspecified: Secondary | ICD-10-CM

## 2013-01-13 DIAGNOSIS — F3289 Other specified depressive episodes: Secondary | ICD-10-CM | POA: Insufficient documentation

## 2013-01-13 DIAGNOSIS — E860 Dehydration: Secondary | ICD-10-CM

## 2013-01-13 DIAGNOSIS — R0602 Shortness of breath: Secondary | ICD-10-CM | POA: Insufficient documentation

## 2013-01-13 DIAGNOSIS — I10 Essential (primary) hypertension: Secondary | ICD-10-CM | POA: Diagnosis present

## 2013-01-13 DIAGNOSIS — Z79899 Other long term (current) drug therapy: Secondary | ICD-10-CM | POA: Insufficient documentation

## 2013-01-13 LAB — CBC
HCT: 42.5 % (ref 39.0–52.0)
MCHC: 35.3 g/dL (ref 30.0–36.0)
Platelets: 149 10*3/uL — ABNORMAL LOW (ref 150–400)
RDW: 12.4 % (ref 11.5–15.5)
WBC: 4.9 10*3/uL (ref 4.0–10.5)

## 2013-01-13 LAB — BASIC METABOLIC PANEL
BUN: 19 mg/dL (ref 6–23)
CO2: 29 mEq/L (ref 19–32)
Chloride: 95 mEq/L — ABNORMAL LOW (ref 96–112)
GFR calc Af Amer: 60 mL/min — ABNORMAL LOW (ref >90)
GFR calc non Af Amer: 52 mL/min — ABNORMAL LOW (ref >90)
Potassium: 3.9 mEq/L (ref 3.5–5.1)
Sodium: 133 mEq/L — ABNORMAL LOW (ref 135–145)

## 2013-01-13 LAB — TROPONIN I
Troponin I: <0.3 ng/mL (ref <0.30)
Troponin I: <0.3 ng/mL (ref <0.30)

## 2013-01-13 MED ORDER — BUPROPION HCL ER (SR) 150 MG PO TB12
ORAL_TABLET | ORAL | Status: AC
  Filled 2013-01-13: qty 1

## 2013-01-13 MED ORDER — SODIUM CHLORIDE 0.9 % IV SOLN
INTRAVENOUS | Status: DC
  Administered 2013-01-13: 21:00:00 via INTRAVENOUS

## 2013-01-13 MED ORDER — ACETAMINOPHEN 325 MG PO TABS: 650.0000 mg | ORAL_TABLET | ORAL | Status: DC | PRN

## 2013-01-13 MED ORDER — ONDANSETRON HCL 4 MG/2ML IJ SOLN: 4.0000 mg | Freq: Four times a day (QID) | INTRAMUSCULAR | Status: DC | PRN

## 2013-01-13 MED ORDER — BUPROPION HCL ER (SR) 150 MG PO TB12
150.0000 mg | ORAL_TABLET | Freq: Two times a day (BID) | ORAL | Status: DC
  Administered 2013-01-13 – 2013-01-14 (×2): 150 mg via ORAL
  Filled 2013-01-13 (×4): qty 1

## 2013-01-13 MED ORDER — NITROGLYCERIN 0.4 MG SL SUBL
0.4000 mg | SUBLINGUAL_TABLET | SUBLINGUAL | Status: DC | PRN
  Filled 2013-01-13: qty 25

## 2013-01-13 MED ORDER — PREGABALIN 75 MG PO CAPS
75.0000 mg | ORAL_CAPSULE | Freq: Two times a day (BID) | ORAL | Status: DC
  Administered 2013-01-13 – 2013-01-14 (×2): 75 mg via ORAL
  Filled 2013-01-13 (×3): qty 1

## 2013-01-13 MED ORDER — ASPIRIN 81 MG PO CHEW: 81.0000 mg | CHEWABLE_TABLET | Freq: Every day | ORAL | Status: DC

## 2013-01-13 MED ORDER — BUPRENORPHINE HCL-NALOXONE HCL 8-2 MG SL FILM: 1.0000 | ORAL_FILM | Freq: Two times a day (BID) | SUBLINGUAL | Status: DC

## 2013-01-13 MED ORDER — ASPIRIN 325 MG PO TABS
325.0000 mg | ORAL_TABLET | ORAL | Status: AC
  Administered 2013-01-13: 325 mg via ORAL
  Filled 2013-01-13: qty 1

## 2013-01-13 MED ORDER — BUPRENORPHINE HCL-NALOXONE HCL 8-2 MG SL SUBL
1.0000 | SUBLINGUAL_TABLET | Freq: Two times a day (BID) | SUBLINGUAL | Status: DC
  Administered 2013-01-13 – 2013-01-14 (×2): 1 via SUBLINGUAL
  Filled 2013-01-13 (×2): qty 1

## 2013-01-13 NOTE — H&P (Addendum)
History and Physical  Darren Gay WUX:[id number] DOB: 03/15/1951 DOA: 01/13/2013  Referring physician: Donnetta Hutching, MD in ED PCP: [name], MD   Chief Complaint: chest pressure  HPI:  62 year old man presents to the emergency department with three-day history of chest discomfort. Initial evaluation included normal EKG, normal troponin. Because of age and risk factors, referred for observation.  History obtained from patient supplemented by his wife. He just returned from vacation in the [place] this past week. 10/1 he developed some chest discomfort like a squeezing sensation that is aggravated with movement. This discomfort has been constant for the last 3 days. He said some numbness in bilateral arms. No nausea or vomiting. No neck pain. No specific pain rating or alleviating factors except as noted. Last 24 hours she has noted more shortness of breath with exertion, today when carrying suitcases into the attic and working outside. He has no history of heart disease. He has also had a low-grade fever and felt poorly for the last few days and had muscle aches a few days ago. Today his wife noted rash on his legs, abdomen and back. The patient had not noted this. No pruritus or pain. Of note the patient's daughter with whom he has had close contact recently on vacation on cruise to the [place] and has very similar symptoms including shortness of breath, rash and has been treated at urgent care and discharged home. Daughter also had severe headache and neck stiffness. Patient has had no headache or significant neck stiffness. No nausea or vomiting.  In the emergency department he was noted be afebrile with temperature 100.1. Normal oxygenation, pulse. Borderline low blood pressure. He was treated with aspirin but because of his borderline pressure was given no nitroglycerin or pain medication. Troponin, EKG unremarkable. CBC unremarkable. Basic metabolic panel notable for low sodium, chloride and  elevated creatinine suggestive of dehydration. Chest x-ray negative.  Review of Systems:  Negative for visual changes, sore throat, new muscle aches, dysuria, bleeding, n/v/abdominal pain.  Positive for low-grade fever  Past Medical History  Diagnosis Date  . Depression   . Hypertension   . Allergy   . High cholesterol     borderline    Past Surgical History  Procedure Laterality Date  . Knee arthroscopy  2010  . Vasectomy  1991  . Tonsillectomy  1960    Social History:  reports that he has quit smoking. He quit smokeless tobacco use about 29 years ago. He reports that  drinks alcohol. He reports that he does not use illicit drugs.  No Known Allergies  Family History  Problem Relation Age of Onset  . Skin cancer Father   . Hypertension Father   . Breast cancer Mother   . Stomach cancer Mother   . Pancreatic cancer Brother      Prior to Admission medications   Medication Sig Start Date End Date Taking? Authorizing Provider  Amlodipine-Valsartan-HCTZ (EXFORGE HCT) 10-320-25 MG TABS Take 1 tablet by mouth daily.   Yes Historical Provider, MD  ANDROGEL PUMP 20.25 MG/ACT (1.62%) GEL Apply 1 application topically daily. One pump applied to either shoulder once daily 07/20/12  Yes Historical Provider, MD  aspirin 81 MG tablet Take 81 mg by mouth daily.   Yes Historical Provider, MD  buPROPion (WELLBUTRIN SR) 150 MG 12 hr tablet Take 150 mg by mouth 2 (two) times daily.   Yes Historical Provider, MD  Cholecalciferol (VITAMIN D3) 5000 UNITS TABS Take 1 tablet by mouth 2 (two)  times daily.    Yes Historical Provider, MD  CIALIS 20 MG tablet Take 20 mg by mouth daily as needed for erectile dysfunction.  06/04/12  Yes Historical Provider, MD  cyanocobalamin 2000 MCG tablet Take 2,000 mcg by mouth daily.   Yes Historical Provider, MD  LYRICA 75 MG capsule Take 75 mg by mouth 2 (two) times daily.  12/20/12  Yes Historical Provider, MD  Multiple Vitamins-Minerals (MULTIVITAMIN PO) Take 1  tablet by mouth daily.   Yes Historical Provider, MD  Omega-3 Fatty Acids (FISH OIL PO) Take 1 capsule by mouth daily.   Yes Historical Provider, MD  SUBOXONE 8-2 MG FILM Take 1 Film by mouth 2 (two) times daily.  09/05/12  Yes Historical Provider, MD   Physical Exam: Filed Vitals:   01/13/13 1513 01/13/13 1515 01/13/13 1520 01/13/13 1600  BP: 108/67 108/67  94/73  Pulse: 79     Temp: 100.1 F (37.8 C)     TempSrc: Oral     Resp: 16 24  14   Height: 6' (1.829 m)     Weight: 97.523 kg (215 lb)     SpO2:   97%    General: Examined in the emergency department.  Appears calm and comfortable Eyes: PERRL, normal lids, irises  ENT: grossly normal hearing, lips & tongue Neck: no LAD, masses or thyromegaly Cardiovascular: RRR, no m/r/g. No LE edema. Telemetry: SR, PVCs Respiratory: CTA bilaterally, no w/r/r. Normal respiratory effort. Abdomen: soft, ntnd Skin: Lacy reticular rash over the abdomen, lower legs, back. Nontender. No petechiae. No purpura. Musculoskeletal: grossly normal tone BUE/BLE Psychiatric: grossly normal mood and affect, speech fluent and appropriate Neurologic: grossly non-focal.  Wt Readings from Last 3 Encounters:  01/13/13 97.523 kg (215 lb)  09/06/12 94.348 kg (208 lb)  03/21/12 95.709 kg (211 lb)    Labs on Admission:  Basic Metabolic Panel:  Recent Labs Lab 01/13/13 1527  NA 133*  K 3.9  CL 95*  CO2 29  GLUCOSE 99  BUN 19  CREATININE 1.41*  CALCIUM 9.0   CBC:  Recent Labs Lab 01/13/13 1527  WBC 4.9  HGB 15.0  HCT 42.5  MCV 89.1  PLT 149*    Cardiac Enzymes:  Recent Labs Lab 01/13/13 1527  TROPONINI <0.30   Radiological Exams on Admission: Dg Chest Port 1 View  01/13/2013   CLINICAL DATA:  Chest pain  EXAM: PORTABLE CHEST - 1 VIEW  COMPARISON:  05/15/2009  FINDINGS: The heart size and mediastinal contours are within normal limits. Both lungs are clear. The visualized skeletal structures are unremarkable.  IMPRESSION: No active  disease.   Electronically Signed   By: Alcide Clever M.D.   On: 01/13/2013 15:40    EKG: Independently reviewed. Normal sinus rhythm. No acute changes.   Principal Problem:   Chest pain Active Problems:   HYPERTENSION   Fever   Dehydration   Assessment/Plan 1. Chest pain: Some typical, but predominantly atypical features. No history of coronary disease but does have a history of hypertension and hyperlipidemia. Initial troponin negative after 3 days of constant discomfort and EKG is not acute. ACS is doubted. Plan observation, serial troponin. If unrevealing, likely discharged 10/5 with outpatient followup. Heart score 3. I suspect is related to his viral syndrome. Well's score = 0. No signs or symptoms to suggest VTE. 2. Low-grade fever, shortness of breath, rash: Very similar symptoms to his daughter who is also ill (at home). Additionally grandchild also had a rash that appeared after  2 days on the cruise ship in the [place]. Rash lasted 3 days and resolved. Suspect viral infection. No signs or symptoms to suggest severe or life-threatening illness. Monitor clinically. 3. Probable dehydration, possible acute renal failure: IV fluids. Hold diuretics. 4. Hypotension/Hypertension: Currently borderline hypotensive. No recent Cialis. Suspect secondary to dehydration. IV fluids.  Code Status: Full code DVT prophylaxis: early ambulation Family Communication: discussed with wife at bedside Disposition Plan/Anticipated LOS: observation, <24 hours  Time spent: 60 minutes  Brendia Sacks, MD  [facility] Pager [phone] 01/13/2013, 5:36 PM

## 2013-01-13 NOTE — ED Provider Notes (Signed)
CSN: [id number]     Arrival date & time 01/13/13  1505 History  This chart was scribed for Donnetta Hutching, MD by Ardelia Mems, ED Scribe. This patient was seen in room APA04/APA04 and the patient's care was started at [facility].    Chief Complaint  Patient presents with  . Chest Pain    The history is provided by the patient. No language interpreter was used.    HPI Comments: Darren Gay is a 62 y.o. Male with a history of HTN and borderline hypercholesteremia who presents to the Emergency Department complaining of constant, waxing and waning chest tightness over the past 3 days that radiates down his bilateral arms. He reports associated SOB over the past 3 days, which is worsened with exertion. He also reports an associated fever and a rash to his lower legs over the past few days. ED temperature is 100.1 F. He states that he has a history of non-cardiac chest wall pain believed to be from muscle spasms. He states that he has never had a cardiac catheterization. He denies any known family history of cardiac disease, and states that his parents are alive and well- in their 73's. He states that he takes 81 mg of Aspirin daily. He denies smoking currently, but is a former smoker of 2 packs/day of 14 years who quit 29 years ago.  PCP- Dr. [name]   Past Medical History  Diagnosis Date  . Depression   . Hypertension   . Allergy   . High cholesterol     borderline   Past Surgical History  Procedure Laterality Date  . Knee arthroscopy  2010  . Vasectomy  1991  . Tonsillectomy  1960   Family History  Problem Relation Age of Onset  . Skin cancer Father   . Hypertension Father   . Breast cancer Mother   . Stomach cancer Mother   . Pancreatic cancer Brother    History  Substance Use Topics  . Smoking status: Former Smoker -- 2.00 packs/day for 14 years  . Smokeless tobacco: Former Neurosurgeon    Quit date: 12/19/1983  . Alcohol Use: Yes     Comment: 2-3 beers daily    Review of  Systems A complete 10 system review of systems was obtained and all systems are negative except as noted in the HPI and PMH.   Allergies  Review of patient's allergies indicates no known allergies.  Home Medications   Current Outpatient Rx  Name  Route  Sig  Dispense  Refill  . Amlodipine-Valsartan-HCTZ (EXFORGE HCT) 10-320-25 MG TABS   Oral   Take 1 tablet by mouth daily.         . ANDROGEL PUMP 20.25 MG/ACT (1.62%) GEL               . aspirin 81 MG tablet   Oral   Take 81 mg by mouth daily.         Marland Kitchen buPROPion (WELLBUTRIN SR) 150 MG 12 hr tablet   Oral   Take 150 mg by mouth 2 (two) times daily.         . Cholecalciferol (VITAMIN D3) 5000 UNITS TABS   Oral   Take 2 tablets by mouth daily.         Marland Kitchen CIALIS 20 MG tablet               . cyanocobalamin 2000 MCG tablet   Oral   Take 2,000 mcg by mouth daily.         Marland Kitchen  Multiple Vitamins-Minerals (MULTIVITAMIN PO)   Oral   Take 1 tablet by mouth daily.         . Omega-3 Fatty Acids (FISH OIL PO)   Oral   Take 1 capsule by mouth daily.         Marland Kitchen pyridOXINE (VITAMIN B-6) 100 MG tablet   Oral   Take 100 mg by mouth daily.         . SUBOXONE 8-2 MG FILM                Triage Vitals: BP 108/67  Pulse 79  Temp(Src) 100.1 F (37.8 C) (Oral)  Resp 16  Ht 6' (1.829 m)  Wt 215 lb (97.523 kg)  BMI 29.15 kg/m2  Physical Exam  Nursing note and vitals reviewed. Constitutional: He is oriented to person, place, and time. He appears well-developed and well-nourished.  HENT:  Head: Normocephalic and atraumatic.  Eyes: Conjunctivae and EOM are normal. Pupils are equal, round, and reactive to light.  Neck: Normal range of motion. Neck supple.  Cardiovascular: Normal rate, regular rhythm and normal heart sounds.   Pulmonary/Chest: Effort normal and breath sounds normal.  Abdominal: Soft. Bowel sounds are normal.  Musculoskeletal: Normal range of motion.  Neurological: He is alert and oriented to  person, place, and time.  Skin: Skin is warm and dry.  Psychiatric: He has a normal mood and affect.    ED Course  Procedures (including critical care time)  DIAGNOSTIC STUDIES: Oxygen Saturation is 97% on RA, normal by my interpretation.    COORDINATION OF CARE: 3:28 PM- Discussed plan to obtain diagnostic labs and radiology, along with plan for pt to receive Aspirin and NTG in the ED. Discussed likely plan for admission given the concerning nature of pt's symptoms. Pt advised of plan for treatment and pt agrees.  Medications  nitroGLYCERIN (NITROSTAT) SL tablet 0.4 mg (not administered)  aspirin tablet 325 mg (325 mg Oral Given 01/13/13 1541)   Labs Review Labs Reviewed  CBC - Abnormal; Notable for the following:    Platelets 149 (*)    All other components within normal limits  BASIC METABOLIC PANEL - Abnormal; Notable for the following:    Sodium 133 (*)    Chloride 95 (*)    Creatinine, Ser 1.41 (*)    GFR calc non Af Amer 52 (*)    GFR calc Af Amer 60 (*)    All other components within normal limits  TROPONIN I   Imaging Review Dg Chest Port 1 View  01/13/2013   CLINICAL DATA:  Chest pain  EXAM: PORTABLE CHEST - 1 VIEW  COMPARISON:  05/15/2009  FINDINGS: The heart size and mediastinal contours are within normal limits. Both lungs are clear. The visualized skeletal structures are unremarkable.  IMPRESSION: No active disease.   Electronically Signed   By: Alcide Clever M.D.   On: 01/13/2013 15:40   Date: 01/13/2013  Rate: 74  Rhythm: normal sinus rhythm  QRS Axis: normal  Intervals: normal  ST/T Wave abnormalities: normal  Conduction Disutrbances: none  Narrative Interpretation: unremarkable   MDM  No diagnosis found. Patient has good history for acute coronary syndrome.   Risk factors include hypertension, hypertriglyceridemia. Rx aspirin. Held beta blocker secondary to low blood pressure.  He also has a low-grade fever and a diffuse macular erythematous rash.   Discussed with Dr. Irene Limbo.  He will examine patient   I personally performed the services described in this documentation, which was scribed  in my presence. The recorded information has been reviewed and is accurate.     Donnetta Hutching, MD 01/13/13 1710

## 2013-01-13 NOTE — ED Notes (Signed)
Diffuse, red raised rash noted to body. + fever. Denies itching.

## 2013-01-13 NOTE — ED Notes (Signed)
Patient states daughter had similar symptoms (headache, rash, fever, neck stiffness). Recent cruise to [place]. Dr Adriana Simas informed.

## 2013-01-13 NOTE — ED Notes (Signed)
3 days history of tightness across chest bilaterally w/pain radiating down both arms.  Some shortness of breath.  Tightness is constant but waxes and wanes in severity.  Has felt tired and "washed out".  Denies n/v/dizziness.

## 2013-01-13 NOTE — ED Notes (Signed)
Report given to Cindy, RN unit 300. Ready to receive patient. 

## 2013-01-14 DIAGNOSIS — I10 Essential (primary) hypertension: Secondary | ICD-10-CM

## 2013-01-14 DIAGNOSIS — E860 Dehydration: Secondary | ICD-10-CM

## 2013-01-14 LAB — COMPREHENSIVE METABOLIC PANEL
AST: 37 U/L (ref 0–37)
Albumin: 3.2 g/dL — ABNORMAL LOW (ref 3.5–5.2)
Alkaline Phosphatase: 89 U/L (ref 39–117)
BUN: 19 mg/dL (ref 6–23)
CO2: 30 mEq/L (ref 19–32)
Chloride: 93 mEq/L — ABNORMAL LOW (ref 96–112)
Creatinine, Ser: 1.37 mg/dL — ABNORMAL HIGH (ref 0.50–1.35)
GFR calc non Af Amer: 54 mL/min — ABNORMAL LOW (ref >90)
Glucose, Bld: 106 mg/dL — ABNORMAL HIGH (ref 70–99)
Potassium: 4 mEq/L (ref 3.5–5.1)
Total Bilirubin: 0.4 mg/dL (ref 0.3–1.2)

## 2013-01-14 LAB — CBC
HCT: 40.6 % (ref 39.0–52.0)
Hemoglobin: 14.1 g/dL (ref 13.0–17.0)
MCH: 31.3 pg (ref 26.0–34.0)
MCHC: 34.7 g/dL (ref 30.0–36.0)
RDW: 12.6 % (ref 11.5–15.5)

## 2013-01-14 LAB — TROPONIN I
Troponin I: <0.3 ng/mL (ref <0.30)
Troponin I: <0.3 ng/mL (ref <0.30)

## 2013-01-14 LAB — PROTIME-INR: Prothrombin Time: 14.3 seconds (ref 11.6–15.2)

## 2013-01-14 NOTE — Plan of Care (Signed)
Problem: Discharge Progression Outcomes Goal: Other Discharge Outcomes/Goals Outcome: Completed/Met Date Met:  01/14/13 Discharged to home with spouse

## 2013-01-14 NOTE — Discharge Summary (Addendum)
Physician Discharge Summary  Darren Gay:[id number] DOB: November 18, 1950 DOA: 01/13/2013  PCP: Darren Pizza, MD  Admit date: 01/13/2013 Discharge date: 01/14/2013  Recommendations for Outpatient Follow-up:  1. Followup chest pain, favor noncardiac. Outpatient followup with cardiology will be arranged--discussed with Aurther Loft at [facility] will call patient for follow-up to arranged in approx next 2 weeks. 2. Followup hypertension. Given borderline low blood pressure Exforge HCTZ has been held for now. 3. Consider repeat basic metabolic panel and followup. Does the patient have a component of chronic kidney disease?  Follow-up Information   Follow up with Darren Pizza, MD In 1 week.   Specialty:  Internal Medicine   Contact information:    [address]  [place] [address] [phone]      Discharge Diagnoses:  1. Chest pain 2. Low-grade fever, shortness of breath, rash, suspected viral infection 3. Suspected dehydration 4. Possible chronic kidney disease  Discharge Condition: Improved Disposition: Home  Diet recommendation: Heart healthy  Filed Weights   01/13/13 1513 01/13/13 1853  Weight: 97.523 kg (215 lb) 97 kg (213 lb 13.5 oz)    History of present illness:  62 year old man presents to the emergency department with three-day history of chest discomfort. Initial evaluation included normal EKG, normal troponin. Because of age and risk factors, referred for observation.  Hospital Course:  [name] was admitted for observation. No recurrent chest pain. Troponins were negative, telemetry unremarkable. Repeat EKG normal. His rash has improved. Given multiple sick contacts with similar symptoms I suspect his chest pain was related to his recent illness rather than cardiac in nature. However, given age, hyperlipidemia and hypertension Will refer to cardiology for outpatient followup and any further recommendations. Continue aspirin for now. Because of borderline low blood pressure  his hypertensive medication will be held.  1. Chest pain: Some typical but predominantly atypical features. No history of coronary artery disease. Risk factors hypertension and hyperlipidemia. Troponins negative. Suspect related to recent viral infection. Associated shortness of breath has resolved. 2. Low-grade fever, shortness of breath, rash: All are resolving rapidly. His daughter's rash is also gone. Presumed viral illness. 3. Suspected dehydration, suspect chronic kidney disease: Continue to hold diuretics and blood pressure medications. Followup as an outpatient. 4. Hypertension: Currently normal blood pressure without medication. Close outpatient followup.  Consultants:  None Procedures:  None  Discharge Instructions  Discharge Orders   Future Orders Complete By Expires   Diet - low sodium heart healthy  As directed    Discharge instructions  As directed    Comments:     Do not take Exforge HCTZ blood pressure medication until advised by PCP. Monitor your blood pressure daily and keep a log. Call your physician or seek immediate medical attention for recurrent chest pain, shortness of breath or low blood pressure with top number less than 90. You will receive a call 10/6 from the cardiology office to arrange followup.   Increase activity slowly  As directed        Medication List    STOP taking these medications       EXFORGE HCT 10-320-25 MG Tabs  Generic drug:  Amlodipine-Valsartan-HCTZ      TAKE these medications       ANDROGEL PUMP 20.25 MG/ACT (1.62%) Gel  Generic drug:  Testosterone  Apply 1 application topically daily. One pump applied to either shoulder once daily     aspirin 81 MG tablet  Take 81 mg by mouth daily.     buPROPion  150 MG 12 hr tablet  Commonly known as:  WELLBUTRIN SR  Take 150 mg by mouth 2 (two) times daily.     CIALIS 20 MG tablet  Generic drug:  tadalafil  Take 20 mg by mouth daily as needed for erectile dysfunction.      cyanocobalamin 2000 MCG tablet  Take 2,000 mcg by mouth daily.     FISH OIL PO  Take 1 capsule by mouth daily.     LYRICA 75 MG capsule  Generic drug:  pregabalin  Take 75 mg by mouth 2 (two) times daily.     MULTIVITAMIN PO  Take 1 tablet by mouth daily.     SUBOXONE 8-2 MG Film  Generic drug:  Buprenorphine HCl-Naloxone HCl  Take 1 Film by mouth 2 (two) times daily.     Vitamin D3 5000 UNITS Tabs  Take 1 tablet by mouth 2 (two) times daily.       No Known Allergies  The results of significant diagnostics from this hospitalization (including imaging, microbiology, ancillary and laboratory) are listed below for reference.    Significant Diagnostic Studies: Dg Chest Port 1 View  01/13/2013   CLINICAL DATA:  Chest pain  EXAM: PORTABLE CHEST - 1 VIEW  COMPARISON:  05/15/2009  FINDINGS: The heart size and mediastinal contours are within normal limits. Both lungs are clear. The visualized skeletal structures are unremarkable.  IMPRESSION: No active disease.   Electronically Signed   By: Alcide Clever M.D.   On: 01/13/2013 15:40   Labs: Basic Metabolic Panel:  Recent Labs Lab 01/13/13 1527 01/14/13 0411  NA 133* 133*  K 3.9 4.0  CL 95* 93*  CO2 29 30  GLUCOSE 99 106*  BUN 19 19  CREATININE 1.41* 1.37*  CALCIUM 9.0 8.4   Liver Function Tests:  Recent Labs Lab 01/14/13 0411  AST 37  ALT 53  ALKPHOS 89  BILITOT 0.4  PROT 6.2  ALBUMIN 3.2*   CBC:  Recent Labs Lab 01/13/13 1527 01/14/13 0411  WBC 4.9 5.3  HGB 15.0 14.1  HCT 42.5 40.6  MCV 89.1 90.0  PLT 149* 165   Cardiac Enzymes:  Recent Labs Lab 01/13/13 1527 01/13/13 2137 01/14/13 0410 01/14/13 0851  TROPONINI <0.30 <0.30 <0.30 <0.30    Principal Problem:   Chest pain Active Problems:   HYPERTENSION   Fever   Dehydration   Time coordinating discharge: 25 minutes  Signed:  Brendia Sacks, MD [facility] 01/14/2013, 10:50 AM  Discharge

## 2013-01-14 NOTE — Progress Notes (Signed)
[facility] PROGRESS NOTE  Darren Gay ZOX:[id number] DOB: 1951/02/23 DOA: 01/13/2013 PCP: [name], MD  Assessment/Plan: 1. Chest pain: Some typical but predominantly atypical features. No history of coronary artery disease. Risk factors hypertension and hyperlipidemia. Troponins negative. Suspect related to recent viral infection. Associated shortness of breath has resolved. 2. Low-grade fever, shortness of breath, rash: All are resolving rapidly. His daughter's rash is also gone. Presumed viral illness. 3. Suspected dehydration, suspect chronic kidney disease: Continue to hold diuretics and blood pressure medications. Followup as an outpatient. 4. Hypertension: Currently normal blood pressure without medication. Close outpatient followup.  Overall much improved urine I suspect his symptoms are related to his viral illness. No further inpatient testing recommended. Outpatient cardiology evaluation will be arranged.   Hold Exforge hydrochlorothiazide on discharge  Monitor blood pressure at least daily at home  Close outpatient followup  Outpatient cardiology followup  Discharge today  Above discussed with wife at bedside  Brendia Sacks, MD  [facility]  Pager [phone] If 7PM-7AM, please contact night-coverage at [email or web address], password [facility] 01/14/2013, 10:39 AM  LOS: 1 day   Summary: 62 year old man presents to the emergency department with three-day history of chest discomfort. Initial evaluation included normal EKG, normal troponin. Because of age and risk factors, referred for observation.  Consultants:  None  Procedures:  None  HPI/Subjective: Feels better today. Chest discomfort has resolved. Breathing much better. Rash resolving.  Objective: Filed Vitals:   01/13/13 1853 01/13/13 2142 01/14/13 0150 01/14/13 0500  BP: 94/65 88/56 96/61  97/61  Pulse: 71 73 82 70  Temp: 99.8 F (37.7 C) 99.7 F (37.6 C) 99.3 F (37.4 C) 99.3 F (37.4 C)   TempSrc: Oral Oral Oral Oral  Resp: 16 16 16 16   Height: 6' (1.829 m)     Weight: 97 kg (213 lb 13.5 oz)     SpO2: 95% 99% 99% 98%    Intake/Output Summary (Last 24 hours) at 01/14/13 1039 Last data filed at 01/14/13 0828  Gross per 24 hour  Intake    480 ml  Output      0 ml  Net    480 ml     Filed Weights   01/13/13 1513 01/13/13 1853  Weight: 97.523 kg (215 lb) 97 kg (213 lb 13.5 oz)    Exam:   Afebrile, vital signs stable.  General: Appears calm and comfortable.  Psychiatric: Grossly normal in affect. Speech fluent and appropriate.  Cardiovascular: Regular rate and rhythm. No murmur, rub, gallop. No lower extremity edema.  Respiratory: Clear to auscultation bilaterally. No wheezes, rales, rhonchi. Normal respiratory effort.  Skin: Near resolution of macular rash over the lower charities and chest/abdomen.  Data Reviewed:  Troponins negative.  Modest improvement in creatinine, no change to sodium 133.  CBC and INR normal.  EKG: Sinus rhythm. No acute changes.  Telemetry sinus bradycardia. No arrhythmias  Scheduled Meds: . aspirin  81 mg Oral Daily  . buprenorphine-naloxone  1 tablet Sublingual BID  . buPROPion  150 mg Oral BID  . pregabalin  75 mg Oral BID   Continuous Infusions: . sodium chloride 75 mL/hr at 01/13/13 2031    Principal Problem:   Chest pain Active Problems:   HYPERTENSION   Fever   Dehydration

## 2013-01-14 NOTE — Progress Notes (Signed)
RN requested breathing treatment for patient. RT went to assess patient, he has no respiratory history or noted respiratory medications, patient was soundly asleep, on room air and appeared in no distress. RT will assess later if needed.

## 2013-02-01 ENCOUNTER — Encounter: Payer: 59 | Admitting: Cardiovascular Disease

## 2013-02-05 ENCOUNTER — Encounter: Payer: Self-pay | Admitting: *Deleted

## 2013-02-05 ENCOUNTER — Encounter: Payer: Self-pay | Admitting: Cardiovascular Disease

## 2013-02-05 ENCOUNTER — Ambulatory Visit (INDEPENDENT_AMBULATORY_CARE_PROVIDER_SITE_OTHER): Payer: 59 | Admitting: Cardiovascular Disease

## 2013-02-05 VITALS — BP 104/64 | HR 72 | Ht 72.0 in | Wt 204.0 lb

## 2013-02-05 DIAGNOSIS — I959 Hypotension, unspecified: Secondary | ICD-10-CM

## 2013-02-05 DIAGNOSIS — R079 Chest pain, unspecified: Secondary | ICD-10-CM

## 2013-02-05 DIAGNOSIS — R0602 Shortness of breath: Secondary | ICD-10-CM

## 2013-02-05 NOTE — Progress Notes (Signed)
Patient ID: Darren Gay, male   DOB: [birth year], 62 y.o.   MRN: [id number]       CARDIOLOGY CONSULT NOTE  Patient ID: Darren Gay MRN: [id number] DOB/AGE: 10/01/1950 62 y.o.  Admit date: (Not on file) Primary Physician [name], MD  Reason for Consultation: chest pain  HPI: Darren Gay is a 62 yr old male who was recently observed in the hospital for a 3-day h/o chest pain, low-grade fever, and rash. The pain appeared to be atypical and non-cardiac. He had normal troponins and his ECG showed normal sinus rhythm. His creatinine was 1.37 (GFR 54 ml/min) and was noted to be hypotensive, thus his Exforge was held. He has a h/o HTN and what appears to be CKD, along with hypertriglyceridemia. Of note, he had been vacationing in the [place] prior to presentation. His pain was described as a squeezing sensation, aggravated with movement, and constant over 3 days. He had b/l arm numbness as well. He was also noted to be dyspneic with exertion. He has no h/o heart disease. The patient's daughter had similar symptoms, with whom he had close contact, on this trip (cruise). Ultimately, it was deemed due to a viral illness, the patient was hydrated and monitored, and discharged. The patient denied n/v, headache, and neck stiffness, but his daughter had both severe headaches as neck stiffness. His sodium and chloride were noted to be low, again favoring dehydration. His chest xray was normal.  His SBP's used to run in the 139-170 mmHg range, but in the past month, he has noted it to always be normal, and sometimes in the 100 mmHg range. He drinks fluids regularly (V8 vegetable juice, coffee, Gatorade, beer). He hasn't noted an inability to perform his duties at work.  He currently denies chest pain, shortness of breath, palpitations, and dizziness. He hasn't noticed any fatigue, nor a dramatic change in his exercise tolerance from last year until now.   SocHx: quit cigarette smoking in 1985. Works as  an Personnel officer. FamHx: no h/o premature CAD. Father is 43 and only recently diagnosed with HTN.    No Known Allergies  Current Outpatient Prescriptions  Medication Sig Dispense Refill  . ANDROGEL PUMP 20.25 MG/ACT (1.62%) GEL Apply 1 application topically daily. One pump applied to either shoulder once daily      . aspirin 81 MG tablet Take 81 mg by mouth daily.      Marland Kitchen buPROPion (WELLBUTRIN SR) 150 MG 12 hr tablet Take 150 mg by mouth 2 (two) times daily.      . Cholecalciferol (VITAMIN D3) 5000 UNITS TABS Take 1 tablet by mouth 2 (two) times daily.       Marland Kitchen CIALIS 20 MG tablet Take 20 mg by mouth daily as needed for erectile dysfunction.       . cyanocobalamin 2000 MCG tablet Take 2,000 mcg by mouth daily.      Marland Kitchen LYRICA 75 MG capsule Take 75 mg by mouth 2 (two) times daily.       . Multiple Vitamins-Minerals (MULTIVITAMIN PO) Take 1 tablet by mouth daily.      Marland Kitchen omega-3 acid ethyl esters (LOVAZA) 1 G capsule Take 2 g by mouth 2 (two) times daily.      . Omega-3 Fatty Acids (FISH OIL PO) Take 1 capsule by mouth daily.      . SUBOXONE 8-2 MG FILM Take 1 Film by mouth 2 (two) times daily.       Marland Kitchen EXFORGE HCT 10-320-25  MG TABS        No current facility-administered medications for this visit.    Past Medical History  Diagnosis Date  . Depression   . Hypertension   . Allergy   . High cholesterol     borderline    Past Surgical History  Procedure Laterality Date  . Knee arthroscopy  2010  . Vasectomy  1991  . Tonsillectomy  1960    History   Social History  . Marital Status: Married    Spouse Name: Velna Hatchet    Number of Children: 3  . Years of Education: 4y Trade   Occupational History  .      [employer]   Social History Main Topics  . Smoking status: Former Smoker -- 2.00 packs/day for 14 years  . Smokeless tobacco: Former Neurosurgeon    Quit date: 12/19/1983  . Alcohol Use: Yes     Comment: 2-3 beers daily  . Drug Use: No  . Sexual Activity: Yes    Birth Control/  Protection: None   Other Topics Concern  . Not on file   Social History Narrative   Pt lives at home with his family.   Caffeine Use: 2 cups daily     Family History  Problem Relation Age of Onset  . Skin cancer Father   . Hypertension Father   . Breast cancer Mother   . Stomach cancer Mother   . Pancreatic cancer Brother      Prior to Admission medications   Medication Sig Start Date End Date Taking? Authorizing Provider  ANDROGEL PUMP 20.25 MG/ACT (1.62%) GEL Apply 1 application topically daily. One pump applied to either shoulder once daily 07/20/12  Yes Historical Provider, MD  aspirin 81 MG tablet Take 81 mg by mouth daily.   Yes Historical Provider, MD  buPROPion (WELLBUTRIN SR) 150 MG 12 hr tablet Take 150 mg by mouth 2 (two) times daily.   Yes Historical Provider, MD  Cholecalciferol (VITAMIN D3) 5000 UNITS TABS Take 1 tablet by mouth 2 (two) times daily.    Yes Historical Provider, MD  CIALIS 20 MG tablet Take 20 mg by mouth daily as needed for erectile dysfunction.  06/04/12  Yes Historical Provider, MD  cyanocobalamin 2000 MCG tablet Take 2,000 mcg by mouth daily.   Yes Historical Provider, MD  LYRICA 75 MG capsule Take 75 mg by mouth 2 (two) times daily.  12/20/12  Yes Historical Provider, MD  Multiple Vitamins-Minerals (MULTIVITAMIN PO) Take 1 tablet by mouth daily.   Yes Historical Provider, MD  omega-3 acid ethyl esters (LOVAZA) 1 G capsule Take 2 g by mouth 2 (two) times daily.   Yes Historical Provider, MD  Omega-3 Fatty Acids (FISH OIL PO) Take 1 capsule by mouth daily.   Yes Historical Provider, MD  SUBOXONE 8-2 MG FILM Take 1 Film by mouth 2 (two) times daily.  09/05/12  Yes Historical Provider, MD  EXFORGE HCT 16-109-60 MG TABS  01/23/13   Historical Provider, MD     Review of systems complete and found to be negative unless listed above in HPI     Physical exam Height 6' (1.829 m), weight 204 lb (92.534 kg). General: NAD Neck: No JVD, no thyromegaly or  thyroid nodule.  Lungs: Clear to auscultation bilaterally with normal respiratory effort. CV: Nondisplaced PMI.  Heart regular S1/S2, no S3/S4, no murmur.  No peripheral edema.  No carotid bruit.  Normal pedal pulses.  Abdomen: Soft, nontender, no hepatosplenomegaly, no distention.  Skin: Intact without lesions or rashes.  Neurologic: Alert and oriented x 3.  Psych: Normal affect. Extremities: No clubbing or cyanosis.  HEENT: Normal.   Labs:   Lab Results  Component Value Date   WBC 5.3 01/14/2013   HGB 14.1 01/14/2013   HCT 40.6 01/14/2013   MCV 90.0 01/14/2013   PLT 165 01/14/2013   No results found for this basename: NA, K, CL, CO2, BUN, CREATININE, CALCIUM, LABALBU, PROT, BILITOT, ALKPHOS, ALT, AST, GLUCOSE,  in the last 168 hours Lab Results  Component Value Date   TROPONINI <0.30 01/14/2013    Lab Results  Component Value Date   CHOL 224* 06/04/2009   CHOL 202* 09/27/2006   Lab Results  Component Value Date   HDL 58.80 06/04/2009   HDL 02.7 09/27/2006   No results found for this basename: [facility]   Lab Results  Component Value Date   TRIG 205.0* 06/04/2009   TRIG 129 09/27/2006   Lab Results  Component Value Date   CHOLHDL 4 06/04/2009   CHOLHDL 4.4 CALC 09/27/2006   Lab Results  Component Value Date   LDLDIRECT 150.6 06/04/2009   LDLDIRECT 136.9 09/27/2006       ECG findings in HPI  Studies: See HPI   ASSESSMENT AND PLAN:  1. Chest pain, hypotension, and dyspnea: the symptoms for which he was hospitalized appear to have been atypical for CAD. He has been more hypotensive lately, in spite of drinking adequate fluids and being off of his antihypertensive medication. I will obtain a stress echocardiogram to see if he has marked hypotension with exercise, and to evaluate for inducible ischemia.  2. Hypotension: as per #1, continue to hold Exforge.  Dispo: f/u 1 month  Signed: Prentice Docker, M.D., F.A.C.C.  02/05/2013, 8:28 AM

## 2013-02-05 NOTE — Patient Instructions (Addendum)
Your physician recommends that you schedule a follow-up appointment in:ONE MONTH  Your physician has requested that you have a stress echocardiogram. For further information please visit [email or web address] Please follow instruction sheet as given.  WE WILL CALL YOU WITH YOUR TEST RESULTS/INSTRUCTIONS/NEXT STEPS ONCE RECEIVED BY THE PROVIDER  PLEASE BE ADVISED YOU WILL STILL NEED TO KEEP YOUR FOLLOW UP APPOINTMENT TO DISCUSS FURTHER DETAILS OF YOUR TEST RESULTS/NEXT STEPS/FUTURE PLAN OF CARE WITH YOUR PROVIDER DESPITE THE FACT THAT YOU MAY HAVE NORMAL TEST RESULTS

## 2013-02-15 ENCOUNTER — Other Ambulatory Visit: Payer: Self-pay

## 2013-02-19 ENCOUNTER — Ambulatory Visit (HOSPITAL_COMMUNITY)
Admission: RE | Admit: 2013-02-19 | Discharge: 2013-02-19 | Disposition: A | Discharge status: Home / Self Care | Payer: 59 | Source: Ambulatory Visit | Attending: Cardiovascular Disease | Admitting: Cardiovascular Disease

## 2013-02-19 ENCOUNTER — Encounter (HOSPITAL_COMMUNITY): Payer: Self-pay

## 2013-02-19 DIAGNOSIS — R0609 Other forms of dyspnea: Secondary | ICD-10-CM | POA: Insufficient documentation

## 2013-02-19 DIAGNOSIS — E785 Hyperlipidemia, unspecified: Secondary | ICD-10-CM | POA: Insufficient documentation

## 2013-02-19 DIAGNOSIS — R079 Chest pain, unspecified: Secondary | ICD-10-CM | POA: Insufficient documentation

## 2013-02-19 DIAGNOSIS — I129 Hypertensive chronic kidney disease with stage 1 through stage 4 chronic kidney disease, or unspecified chronic kidney disease: Secondary | ICD-10-CM | POA: Insufficient documentation

## 2013-02-19 DIAGNOSIS — R072 Precordial pain: Secondary | ICD-10-CM

## 2013-02-19 DIAGNOSIS — R0602 Shortness of breath: Secondary | ICD-10-CM

## 2013-02-19 DIAGNOSIS — N189 Chronic kidney disease, unspecified: Secondary | ICD-10-CM | POA: Insufficient documentation

## 2013-02-19 DIAGNOSIS — R0989 Other specified symptoms and signs involving the circulatory and respiratory systems: Secondary | ICD-10-CM | POA: Insufficient documentation

## 2013-02-19 NOTE — Progress Notes (Signed)
Stress Lab Nurses Notes - [facility] Hawking  RADWAN COWLEY 02/19/2013 Reason for doing test: Chest Pain Type of test: Stress Echo Nurse performing test: Parke Poisson, RN Nuclear Medicine Tech: Not Applicable Echo Tech: Veda Canning MD performing test: Dr.Branch / Joni Reining NP Family MD: Dr.Z. Hall Test explained and consent signed: yes IV started: No IV started Symptoms: Fatigue Treatment/Intervention: None Reason test stopped: fatigue After recovery IV was: NA Patient to return to Nuc. Med at : NA Patient discharged: Home Patient's Condition upon discharge was: stable Comments: During test peak BP 161/77 & HR 157.  Recovery BP 145/67 & HR 93.  Symptoms resolved in recovery. Erskine Speed T

## 2013-02-19 NOTE — Progress Notes (Signed)
*  PRELIMINARY RESULTS* Echocardiogram Echocardiogram Stress Test has been performed.  Darren Gay 02/19/2013, 12:29 PM

## 2013-03-21 ENCOUNTER — Ambulatory Visit: Payer: 59 | Admitting: Cardiovascular Disease

## 2013-03-22 ENCOUNTER — Ambulatory Visit (INDEPENDENT_AMBULATORY_CARE_PROVIDER_SITE_OTHER): Payer: 59 | Admitting: Cardiovascular Disease

## 2013-03-22 ENCOUNTER — Encounter: Payer: Self-pay | Admitting: Cardiovascular Disease

## 2013-03-22 VITALS — BP 124/71 | HR 75 | Ht 72.0 in | Wt 207.0 lb

## 2013-03-22 DIAGNOSIS — R0602 Shortness of breath: Secondary | ICD-10-CM

## 2013-03-22 DIAGNOSIS — R079 Chest pain, unspecified: Secondary | ICD-10-CM

## 2013-03-22 DIAGNOSIS — I959 Hypotension, unspecified: Secondary | ICD-10-CM

## 2013-03-22 NOTE — Progress Notes (Signed)
Patient ID: Darren Gay, male   DOB: 05-Mar-1951, 62 y.o.   MRN: [id number]      SUBJECTIVE: The patient is here to followup on the results of his cardiovascular testing performed for the evaluation of chest pain. His stress echocardiogram was normal with no evidence for inducible ischemia. His Duke treadmill score was 9, putting him in a low risk category for future adverse cardiac events. He has had no further episodes of chest pain and is feeling well.    No Known Allergies  Current Outpatient Prescriptions  Medication Sig Dispense Refill  . ANDROGEL PUMP 20.25 MG/ACT (1.62%) GEL Apply 1 application topically daily. One pump applied to either shoulder once daily      . aspirin 81 MG tablet Take 81 mg by mouth daily.      Marland Kitchen buPROPion (WELLBUTRIN SR) 150 MG 12 hr tablet Take 150 mg by mouth 2 (two) times daily.      . Cholecalciferol (VITAMIN D3) 5000 UNITS TABS Take 1 tablet by mouth 2 (two) times daily.       Marland Kitchen CIALIS 20 MG tablet Take 20 mg by mouth daily as needed for erectile dysfunction.       . cyanocobalamin 2000 MCG tablet Take 2,000 mcg by mouth daily.      Marland Kitchen EXFORGE HCT 10-320-25 MG TABS       . LYRICA 75 MG capsule Take 75 mg by mouth 2 (two) times daily.       . Multiple Vitamins-Minerals (MULTIVITAMIN PO) Take 1 tablet by mouth daily.      Marland Kitchen omega-3 acid ethyl esters (LOVAZA) 1 G capsule Take 2 g by mouth 2 (two) times daily.      . Omega-3 Fatty Acids (FISH OIL PO) Take 1 capsule by mouth daily.      . SUBOXONE 8-2 MG FILM Take 1 Film by mouth 2 (two) times daily.        No current facility-administered medications for this visit.    Past Medical History  Diagnosis Date  . Depression   . Hypertension   . Allergy   . High cholesterol     borderline    Past Surgical History  Procedure Laterality Date  . Knee arthroscopy  2010  . Vasectomy  1991  . Tonsillectomy  1960    History   Social History  . Marital Status: Married    Spouse Name: Velna Hatchet    Number  of Children: 3  . Years of Education: 4y Trade   Occupational History  .      [employer]   Social History Main Topics  . Smoking status: Former Smoker -- 2.00 packs/day for 14 years  . Smokeless tobacco: Former Neurosurgeon    Quit date: 12/19/1983  . Alcohol Use: Yes     Comment: 2-3 beers daily  . Drug Use: No  . Sexual Activity: Yes    Birth Control/ Protection: None   Other Topics Concern  . Not on file   Social History Narrative   Pt lives at home with his family.   Caffeine Use: 2 cups daily     Filed Vitals:   03/22/13 1443  BP: 124/71  Pulse: 75  Height: 6' (1.829 m)  Weight: 207 lb (93.895 kg)    PHYSICAL EXAM General: NAD Neck: No JVD, no thyromegaly or thyroid nodule.  Lungs: Clear to auscultation bilaterally with normal respiratory effort. CV: Nondisplaced PMI.  Heart regular S1/S2, no S3/S4, no murmur.  No  peripheral edema.  No carotid bruit.  Normal pedal pulses.  Abdomen: Soft, nontender, no hepatosplenomegaly, no distention.  Neurologic: Alert and oriented x 3.  Psych: Normal affect. Extremities: No clubbing or cyanosis.   ECG: reviewed and available in electronic records.   Stress echo study conclusions:  - Stress ECG conclusions: The stress ECG was normal. - Staged echo: Normal baseline left ventricular systolic function, LVEF 60-65%. The post-stress paraternal long and short axis views have poorly visualized endocarium, cannot rule out wall motion abnormality. The apical views show hypercontractile response of the visualized wall segments. Impressions:  - Negative stress EKG for ischemia. Limited acoustic windows on echo imaging as described, poorly visualized endocardium in apical views. Visualized wall segments show normal hypercontractile response, cannot rule out ischemic response in poorly visualized segments. Very good functional capacity (130% of predicted based on gender and age), Duke treadmill score is 9 consistent with low  risk for major cardiac events. Correlate stress data clinically, if indicated consider alternative evaluation for ischemia.     ASSESSMENT AND PLAN: 1. Chest pain, hypotension, and dyspnea: the symptoms for which he was hospitalized appear to have been due to his viral illness at the time. His stress echocardiogram was normal, with no evidence of inducible ischemia. His Duke treadmill score was 9, placing him in a low risk category for future adverse cardiac events. 2. Hypotension: resolved after resolution of viral illness.  Dispo: f/u prn.  Prentice Docker, M.D., F.A.C.C.

## 2013-03-22 NOTE — Patient Instructions (Signed)
Your physician recommends that you follow-up as needed. 

## 2013-04-18 ENCOUNTER — Encounter (INDEPENDENT_AMBULATORY_CARE_PROVIDER_SITE_OTHER): Payer: Self-pay | Admitting: *Deleted

## 2013-11-12 ENCOUNTER — Encounter: Payer: Self-pay | Admitting: Internal Medicine

## 2014-01-28 ENCOUNTER — Encounter: Payer: 59 | Admitting: Internal Medicine

## 2014-03-11 ENCOUNTER — Ambulatory Visit (AMBULATORY_SURGERY_CENTER): Payer: Self-pay | Admitting: *Deleted

## 2014-03-11 VITALS — Ht 72.0 in | Wt 208.0 lb

## 2014-03-11 DIAGNOSIS — Z1211 Encounter for screening for malignant neoplasm of colon: Secondary | ICD-10-CM

## 2014-03-11 NOTE — Progress Notes (Signed)
Patient denies any allergies to eggs or soy. Patient denies any problems with anesthesia/sedation. Patient denies any oxygen use at home and does not take any diet/weight loss medications. EMMI education assisgned to patient on colonoscopy, this was explained and instructions given to patient. Spoke with Joe,CRNA about patient taking Bunavail, CRNA states that it should be fine with propofol. Patient was going to contact his Doctor about having the colonoscopy and call us back if needed.

## 2014-03-21 ENCOUNTER — Encounter: Payer: 59 | Admitting: Internal Medicine

## 2014-03-25 ENCOUNTER — Encounter: Payer: Self-pay | Admitting: Internal Medicine

## 2014-03-25 ENCOUNTER — Ambulatory Visit (AMBULATORY_SURGERY_CENTER): Payer: 59 | Admitting: Internal Medicine

## 2014-03-25 VITALS — BP 151/88 | HR 54 | Temp 97.8°F | Resp 16 | Ht 72.0 in | Wt 208.0 lb

## 2014-03-25 DIAGNOSIS — D129 Benign neoplasm of anus and anal canal: Secondary | ICD-10-CM

## 2014-03-25 DIAGNOSIS — D128 Benign neoplasm of rectum: Secondary | ICD-10-CM

## 2014-03-25 DIAGNOSIS — D124 Benign neoplasm of descending colon: Secondary | ICD-10-CM

## 2014-03-25 DIAGNOSIS — D122 Benign neoplasm of ascending colon: Secondary | ICD-10-CM

## 2014-03-25 DIAGNOSIS — Z1211 Encounter for screening for malignant neoplasm of colon: Secondary | ICD-10-CM

## 2014-03-25 MED ORDER — SODIUM CHLORIDE 0.9 % IV SOLN: 500.0000 mL | INTRAVENOUS | Status: DC

## 2014-03-25 NOTE — Progress Notes (Signed)
Called to room to assist during endoscopic procedure.  Patient ID and intended procedure confirmed with present staff. Received instructions for my participation in the procedure from the performing physician.  

## 2014-03-25 NOTE — Op Note (Signed)
[facility]  [address]. [place], [address]   COLONOSCOPY PROCEDURE REPORT  PATIENT: Darren Gay, Darren Gay  MR#: [id number] BIRTHDATE: 1950/11/22 , 63  yrs. old GENDER: male ENDOSCOPIST: Gatha Mayer, MD, [facility] PROCEDURE DATE:  03/25/2014 PROCEDURE:   Colonoscopy with snare polypectomy First Screening Colonoscopy - Avg.  risk and is 50 yrs.  old or older - No.  Prior Negative Screening - Now for repeat screening. 10 or more years since last screening  History of Adenoma - Now for follow-up colonoscopy & has been > or = to 3 yrs.  N/A  Polyps Removed Today? Yes. ASA CLASS:   Class II INDICATIONS:average risk for colorectal cancer. MEDICATIONS: Propofol 300 mg IV and Monitored anesthesia care  DESCRIPTION OF PROCEDURE:   After the risks benefits and alternatives of the procedure were thoroughly explained, informed consent was obtained.  The digital rectal exam revealed no abnormalities of the rectum, revealed no prostatic nodules, and revealed the prostate was not enlarged.   The [id number] endoscope was introduced through the anus and advanced to the cecum, which was identified by both the appendix and ileocecal valve. No adverse events experienced.   The quality of the prep was good, using MiraLax  The instrument was then slowly withdrawn as the colon was fully examined.   COLON FINDINGS: Three sessile polyps ranging from 5 to 73mm in size were found in the rectum, descending colon, and ascending colon. Polypectomies were performed using snare cautery (10 mm descending) and with a cold snare.  The resection was complete, the polyp tissue was completely retrieved and sent to histology.   The examination was otherwise normal.  Retroflexed views revealed no abnormalities. The time to cecum=3 minutes 10 seconds.  Withdrawal time=12 minutes 26 seconds.  The scope was withdrawn and the procedure completed. COMPLICATIONS: There were no immediate  complications.  ENDOSCOPIC IMPRESSION: 1.   Three sessile polyps ranging from 5 to 2mm in size were found in the rectum, descending colon, and ascending colon; polypectomies were performed using snare cautery and with a cold snare 2.   The examination was otherwise normal  RECOMMENDATIONS: 1.  Timing of repeat colonoscopy will be determined by pathology findings. 2.  Hold Aspirin and all other NSAIDS for 2 weeks.  eSigned:  Gatha Mayer, MD, [facility] 03/25/2014 9:03 AM   cc: Delphina Cahill. MD and The Patient

## 2014-03-25 NOTE — Progress Notes (Signed)
Stable to RR HOB elevated 

## 2014-03-25 NOTE — Patient Instructions (Addendum)
I found and removed 3 polyps - they all look benign but I anticipate I will recommend returning in 3-5 years. I will let you know pathology results and when to have another routine colonoscopy by mail.   I appreciate the opportunity to care for you. Gatha Mayer, MD, FACG  HOLD ASPIRIN, ASPIRIN PRODUCTS AND NSAIDS(MOTRIN, ALEVE, ADVIL ETC) FOR TWO WEEKS, 04/08/14.  YOU HAD AN ENDOSCOPIC PROCEDURE TODAY AT [facility]: Refer to the procedure report that was given to you for any specific questions about what was found during the examination.  If the procedure report does not answer your questions, please call your gastroenterologist to clarify.  If you requested that your care partner not be given the details of your procedure findings, then the procedure report has been included in a sealed envelope for you to review at your convenience later.  YOU SHOULD EXPECT: Some feelings of bloating in the abdomen. Passage of more gas than usual.  Walking can help get rid of the air that was put into your GI tract during the procedure and reduce the bloating. If you had a lower endoscopy (such as a colonoscopy or flexible sigmoidoscopy) you may notice spotting of blood in your stool or on the toilet paper. If you underwent a bowel prep for your procedure, then you may not have a normal bowel movement for a few days.  DIET: Your first meal following the procedure should be a light meal and then it is ok to progress to your normal diet.  A half-sandwich or bowl of soup is an example of a good first meal.  Heavy or fried foods are harder to digest and may make you feel nauseous or bloated.  Likewise meals heavy in dairy and vegetables can cause extra gas to form and this can also increase the bloating.  Drink plenty of fluids but you should avoid alcoholic beverages for 24 hours.  ACTIVITY: Your care partner should take you home directly after the procedure.  You should plan to take it  easy, moving slowly for the rest of the day.  You can resume normal activity the day after the procedure however you should NOT DRIVE or use heavy machinery for 24 hours (because of the sedation medicines used during the test).    SYMPTOMS TO REPORT IMMEDIATELY: A gastroenterologist can be reached at any hour.  During normal business hours, 8:30 AM to 5:00 PM Monday through Friday, call [phone].  After hours and on weekends, please call the GI answering service at [phone] who will take a message and have the physician on call contact you.   Following lower endoscopy (colonoscopy or flexible sigmoidoscopy):  Excessive amounts of blood in the stool  Significant tenderness or worsening of abdominal pains  Swelling of the abdomen that is new, acute  Fever of 100F or higher  FOLLOW UP: If any biopsies were taken you will be contacted by phone or by letter within the next 1-3 weeks.  Call your gastroenterologist if you have not heard about the biopsies in 3 weeks.  Our staff will call the home number listed on your records the next business day following your procedure to check on you and address any questions or concerns that you may have at that time regarding the information given to you following your procedure. This is a courtesy call and so if there is no answer at the home number and we have not heard from you through the  emergency physician on call, we will assume that you have returned to your regular daily activities without incident.  SIGNATURES/CONFIDENTIALITY: You and/or your care partner have signed paperwork which will be entered into your electronic medical record.  These signatures attest to the fact that that the information above on your After Visit Summary has been reviewed and is understood.  Full responsibility of the confidentiality of this discharge information lies with you and/or your care-partner.

## 2014-03-26 ENCOUNTER — Telehealth: Payer: Self-pay

## 2014-03-26 NOTE — Telephone Encounter (Signed)
Left a message at [phone] for the pt to call us back if any questions or concerns. maw

## 2014-03-29 ENCOUNTER — Encounter: Payer: Self-pay | Admitting: Internal Medicine

## 2014-03-29 DIAGNOSIS — Z860101 Personal history of adenomatous and serrated colon polyps: Secondary | ICD-10-CM

## 2014-03-29 DIAGNOSIS — Z86010 Personal history of colonic polyps: Secondary | ICD-10-CM

## 2014-03-29 HISTORY — DX: Personal history of adenomatous and serrated colon polyps: Z86.0101

## 2014-03-29 HISTORY — DX: Personal history of colonic polyps: Z86.010

## 2014-03-29 NOTE — Progress Notes (Signed)
Quick Note:  3 adenomas max 10 mm Repeat colonoscopy 2018/9 ______

## 2014-07-04 ENCOUNTER — Other Ambulatory Visit (HOSPITAL_COMMUNITY): Payer: Self-pay | Admitting: Radiology

## 2014-07-04 DIAGNOSIS — R0602 Shortness of breath: Secondary | ICD-10-CM

## 2014-07-11 ENCOUNTER — Other Ambulatory Visit (HOSPITAL_COMMUNITY): Payer: Self-pay | Admitting: Internal Medicine

## 2014-07-11 ENCOUNTER — Ambulatory Visit (HOSPITAL_COMMUNITY)
Admission: RE | Admit: 2014-07-11 | Discharge: 2014-07-11 | Disposition: A | Discharge status: Home / Self Care | Payer: 59 | Source: Ambulatory Visit | Attending: Internal Medicine | Admitting: Internal Medicine

## 2014-07-11 DIAGNOSIS — R0602 Shortness of breath: Secondary | ICD-10-CM

## 2014-07-12 ENCOUNTER — Ambulatory Visit (HOSPITAL_COMMUNITY)
Admission: RE | Admit: 2014-07-12 | Discharge: 2014-07-12 | Disposition: A | Discharge status: Home / Self Care | Payer: 59 | Source: Ambulatory Visit | Attending: Internal Medicine | Admitting: Internal Medicine

## 2014-07-12 DIAGNOSIS — R0602 Shortness of breath: Secondary | ICD-10-CM | POA: Insufficient documentation

## 2014-07-12 MED ORDER — ALBUTEROL SULFATE (2.5 MG/3ML) 0.083% IN NEBU
2.5000 mg | INHALATION_SOLUTION | Freq: Once | RESPIRATORY_TRACT | Status: AC
  Administered 2014-07-12: 2.5 mg via RESPIRATORY_TRACT

## 2014-07-14 LAB — PULMONARY FUNCTION TEST
DL/VA % pred: 110 %
DL/VA: 5.24 ml/min/mmHg/L
DLCO COR % PRED: 98 %
DLCO UNC % PRED: 98 %
DLCO cor: 34.6 ml/min/mmHg
DLCO unc: 34.6 ml/min/mmHg
FEF 25-75 Post: 4.25 L/sec
FEF 25-75 Pre: 3.94 L/sec
FEF2575-%Change-Post: 7 %
FEF2575-%Pred-Post: 143 %
FEF2575-%Pred-Pre: 133 %
FEV1-%CHANGE-POST: -1 %
FEV1-%Pred-Post: 89 %
FEV1-%Pred-Pre: 91 %
FEV1-Post: 3.33 L
FEV1-Pre: 3.39 L
FEV1FVC-%Change-Post: 5 %
FEV1FVC-%PRED-PRE: 112 %
FEV6-%Change-Post: -6 %
FEV6-%Pred-Post: 79 %
FEV6-%Pred-Pre: 84 %
FEV6-Post: 3.75 L
FEV6-Pre: 4.02 L
FEV6FVC-%Pred-Post: 105 %
FEV6FVC-%Pred-Pre: 105 %
FVC-%Change-Post: -6 %
FVC-%PRED-PRE: 80 %
FVC-%Pred-Post: 75 %
FVC-PRE: 4.02 L
FVC-Post: 3.75 L
PRE FEV1/FVC RATIO: 84 %
PRE FEV6/FVC RATIO: 100 %
Post FEV1/FVC ratio: 89 %
Post FEV6/FVC ratio: 100 %
RV % PRED: 20 %
RV: 0.49 L
TLC % pred: 75 %
TLC: 5.56 L

## 2014-07-17 ENCOUNTER — Encounter (HOSPITAL_COMMUNITY): Payer: Self-pay

## 2014-08-21 ENCOUNTER — Ambulatory Visit (HOSPITAL_COMMUNITY)
Admission: RE | Admit: 2014-08-21 | Discharge: 2014-08-21 | Disposition: A | Discharge status: Home / Self Care | Payer: 59 | Source: Ambulatory Visit | Attending: Internal Medicine | Admitting: Internal Medicine

## 2014-08-21 ENCOUNTER — Other Ambulatory Visit (HOSPITAL_COMMUNITY): Payer: Self-pay | Admitting: Internal Medicine

## 2014-08-21 DIAGNOSIS — J189 Pneumonia, unspecified organism: Secondary | ICD-10-CM

## 2014-08-21 DIAGNOSIS — R0602 Shortness of breath: Secondary | ICD-10-CM | POA: Insufficient documentation

## 2014-10-29 ENCOUNTER — Ambulatory Visit (INDEPENDENT_AMBULATORY_CARE_PROVIDER_SITE_OTHER): Payer: 59

## 2014-10-29 ENCOUNTER — Ambulatory Visit (INDEPENDENT_AMBULATORY_CARE_PROVIDER_SITE_OTHER): Payer: 59 | Admitting: Family Medicine

## 2014-10-29 ENCOUNTER — Telehealth: Payer: Self-pay

## 2014-10-29 VITALS — BP 122/72 | HR 90 | Temp 99.6°F | Resp 17 | Ht 71.5 in | Wt 220.0 lb

## 2014-10-29 DIAGNOSIS — M10071 Idiopathic gout, right ankle and foot: Secondary | ICD-10-CM | POA: Diagnosis not present

## 2014-10-29 DIAGNOSIS — M25571 Pain in right ankle and joints of right foot: Secondary | ICD-10-CM

## 2014-10-29 DIAGNOSIS — M109 Gout, unspecified: Secondary | ICD-10-CM

## 2014-10-29 LAB — COMPREHENSIVE METABOLIC PANEL
ALT: 32 U/L (ref 0–53)
AST: 28 U/L (ref 0–37)
Albumin: 4.3 g/dL (ref 3.5–5.2)
Alkaline Phosphatase: 68 U/L (ref 39–117)
BUN: 17 mg/dL (ref 6–23)
CALCIUM: 9.6 mg/dL (ref 8.4–10.5)
CHLORIDE: 99 meq/L (ref 96–112)
CO2: 32 meq/L (ref 19–32)
Creat: 1.08 mg/dL (ref 0.50–1.35)
Glucose, Bld: 96 mg/dL (ref 70–99)
Potassium: 4.3 mEq/L (ref 3.5–5.3)
Sodium: 141 mEq/L (ref 135–145)
TOTAL PROTEIN: 7.5 g/dL (ref 6.0–8.3)
Total Bilirubin: 0.4 mg/dL (ref 0.2–1.2)

## 2014-10-29 LAB — POCT SEDIMENTATION RATE: POCT SED RATE: 5 mm/hr (ref 0–22)

## 2014-10-29 LAB — URIC ACID: URIC ACID, SERUM: 8.4 mg/dL — AB (ref 4.0–7.8)

## 2014-10-29 MED ORDER — COLCHICINE 0.6 MG PO TABS: ORAL_TABLET | ORAL | Status: DC

## 2014-10-29 MED ORDER — PREDNISONE 20 MG PO TABS: ORAL_TABLET | ORAL | Status: DC

## 2014-10-29 NOTE — Telephone Encounter (Signed)
Hopper - Pt saw you today and you prescribed colchicine.  The pharmacy told him his insurance is requiring prior authorization for this medicine.  Do you want him to wait for the approval or would you prescribe something else?  He knows the approval can take a while.  Please call [phone]

## 2014-10-29 NOTE — Patient Instructions (Signed)
Take colchicine 2 pills initially, then 1 twice daily for 3 days, then 1 once daily. Decrease dosage or frequency if it causes diarrhea  Take prednisone 20 mg 3 pills daily for 2 days, then 2 daily for 2 days, then 1 daily for 2 days  You can continue taking the Aleve to twice daily if needed for pain  I will let you know the results of your labs in a few days. If you're getting abruptly worse at anytime return. If it does not improving substantially over the next 5-7 days please return

## 2014-10-29 NOTE — Progress Notes (Signed)
  Subjective:  Patient ID: Darren Gay, male    DOB: Jan 04, 1951  Age: 64 y.o. MRN: [id number]  64 year old man who acutely yesterday was walking and had sudden severe pain in the right foot at the base of the fifth toe. He had no injury that he knows of. He was wearing his work boots which have steel toes. He has not had pains like this in his feet before. He has no history of gout. He had shrimp Sunday and yesterday. He is on a number of medications which are listed. He has had a problem with low. Dependence and is on a maintenance program under supervision for that.   Objective:   Pleasant gentleman, alert and oriented, in no major acute distress. He has no obvious edema. He has mild redness of the right fifth toe. On  extension or flexion of the fifth toe he gets acute pain. No tenderness along the metatarsal or the digit itself.  [facility] reading (PRIMARY) by  Dr. Linna Darner  No fracture    Assessment & Plan:   Assessment: Probable gout Acute foot pain  Plan: X-ray to rule out occult fracture, check sedimentation rate, CMP, uric acid There are no Patient Instructions on file for this visit.   HOPPER,DAVID, MD 10/29/2014

## 2014-10-30 NOTE — Telephone Encounter (Signed)
Spoke with pt, advised message from Darren Gay. We may just have to change the colchicine to [place] or colcys. Can we change Rx or should we go with the prior auth on the colchicine.

## 2014-10-30 NOTE — Telephone Encounter (Signed)
Can someone help>? 

## 2014-10-30 NOTE — Telephone Encounter (Signed)
Please clarify the patients medication list - it says he is on prednisone and this should help his gout.  If he is not on prednisone we can put him on indocin which is cheap for the patient.

## 2014-10-30 NOTE — Telephone Encounter (Signed)
I'm guessing that the branded colchicine products (Mitigare and Colcrys) will be cost prohibitive, but it's ok to switch to one of those.  If those are too expensive, Indocin 50 mg 1 PO TID PRN, #30, no refills.

## 2014-10-31 ENCOUNTER — Telehealth: Payer: Self-pay

## 2014-10-31 NOTE — Telephone Encounter (Signed)
Patient is requesting OOW note through next Monday for GOUT.  Also dropped off paperwork for FMLA.   Please call when ready [phone] (H)

## 2014-10-31 NOTE — Telephone Encounter (Signed)
[phone] - spoke to [place] - advsd to try and process Rx for branded Colchicine products (Mitigare and Colcrys) for insurance approval. Nicki Reaper stated Colcrys required prior authorization and Mitigare did not show up in his system at all. Per provider notation - clld in Indocin 50 mg 1 PO TID PRN #30 w/no refills. Scott repeated and confirmed Rx. Clld pt - LMOVM at [phone] regarding Rx being called into pharmacy.

## 2014-11-02 ENCOUNTER — Encounter: Payer: Self-pay | Admitting: Family Medicine

## 2014-11-04 ENCOUNTER — Telehealth: Payer: Self-pay

## 2014-11-04 NOTE — Telephone Encounter (Signed)
Pt clld in - requested office note and lab results be faxed to his PCP Dr. Wende Neighbors, MD  [place], [place] Office# [phone]; Fax# [phone].

## 2014-11-05 ENCOUNTER — Telehealth: Payer: Self-pay | Admitting: Family Medicine

## 2014-11-05 NOTE — Telephone Encounter (Signed)
Patient dropped off FMLA paperwork. Paperwork has already been completed. It needs review and signature. Placed in Dr. Clayborn Heron box on 11/05/2014. Please return to [facility] tray at checkout when done.

## 2014-11-06 NOTE — Telephone Encounter (Signed)
Notified patient via VM that his FMLA forms are ready for pickup.

## 2014-11-07 DIAGNOSIS — Z0271 Encounter for disability determination: Secondary | ICD-10-CM

## 2016-06-11 ENCOUNTER — Ambulatory Visit (INDEPENDENT_AMBULATORY_CARE_PROVIDER_SITE_OTHER): Payer: 59 | Admitting: Orthopedic Surgery

## 2016-06-11 ENCOUNTER — Encounter (INDEPENDENT_AMBULATORY_CARE_PROVIDER_SITE_OTHER): Payer: Self-pay | Admitting: Orthopedic Surgery

## 2016-06-11 DIAGNOSIS — M1A0720 Idiopathic chronic gout, left ankle and foot, without tophus (tophi): Secondary | ICD-10-CM | POA: Diagnosis not present

## 2016-06-11 DIAGNOSIS — M21612 Bunion of left foot: Secondary | ICD-10-CM | POA: Diagnosis not present

## 2016-06-11 NOTE — Progress Notes (Signed)
Office Visit Note   Patient: Darren Gay           Date of Birth: September 14, 1950           MRN: [id number] Visit Date: 06/11/2016              Requested by: Celene Squibb, MD [address] [place], [place] [address] PCP: Wende Neighbors, MD  Chief Complaint  Patient presents with  . Right Foot - Pain  . Left Foot - Pain    HPI: Patient is a 66 y.o male who presents for surgical evaluation of bilateral feet referred by his rheumatologist . There is a hallux valgus deformity on the left. He also has gout. He has a knot on dorsal aspect of right great toe he was told would need to be surgically removed. He is taking allopurinol and prednisone 2 mg daily. He has brought radiographs with him today. Maxcine Ham, RT    Assessment & Plan: Visit Diagnoses:  1. Bunion, left   2. Idiopathic chronic gout of left foot without tophus     Plan: Patient states she has pain with activities of daily living left bunion on the left foot. He is overlapping the great toe and second toe he states elected to proceed with a [employer] osteotomy risk and benefits were discussed including increased risk of infection and increased risk of the wound not healing increased complication risk with the gout. Patient states he understands wish to proceed at this time.  Follow-Up Instructions: Return in about 2 weeks (around 06/25/2016).   Ortho Exam Examination patient is alert oriented no adenopathy well-dressed normal affect normal respiratory effort he does have an antalgic gait. Good dorsalis pedis pulse bilateral examination of right foot he has tophaceous gout over the IP joint of the great toe there is no redness no cellulitis. Examination the left foot he is overlapping of the great toe over the second toe he does have joint space narrowing with bony spurs around the MTP joint with a prominent bunion deformity is a moderate bunion deformity radiographs shows joint space collapse secondary to his  gout. ROS: Complete review of systems otherwise negative except as mentioned in the H&P. Patient states his uric acid most recently was approximately 5. Imaging: No results found.  Labs: Lab Results  Component Value Date   LABURIC 8.4 (H) 10/29/2014    Orders:  No orders of the defined types were placed in this encounter.  No orders of the defined types were placed in this encounter.    Procedures: No procedures performed  Clinical Data: No additional findings.  Subjective: Review of Systems  Objective: Vital Signs: There were no vitals taken for this visit.  Specialty Comments:  No specialty comments available.  PMFS History: Patient Active Problem List   Diagnosis Date Noted  . Bunion, left 06/11/2016  . 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 Hx of adenomatous colonic polyps 03/29/2014  . Chest pain 01/13/2013  . Fever 01/13/2013  . Dehydration 01/13/2013  . Plantar fasciitis of right foot 01/21/2012  . TESTICULAR HYPOFUNCTION 06/13/2009  . FATIGUE 06/04/2009  . HYPERTENSION 06/03/2008  . DEPRESSION 12/26/2006  . ALLERGIC RHINITIS 10/20/2006  . SLEEP APNEA 10/20/2006   Past Medical History:  Diagnosis Date  . Allergy   . Depression   . High cholesterol    borderline  . Hx of adenomatous colonic polyps 03/29/2014  . Hypertension   . Substance abuse  Family History  Problem Relation Age of Onset  . Skin cancer Father   . Hypertension Father   . Breast cancer Mother   . Stomach cancer Mother   . Pancreatic cancer Brother   . Colon cancer Neg Hx   . Esophageal cancer Neg Hx   . Rectal cancer Neg Hx     Past Surgical History:  Procedure Laterality Date  . CARPAL TUNNEL RELEASE Bilateral   . COLONOSCOPY    . KNEE ARTHROSCOPY  2010  . TONSILLECTOMY  1960  . VASECTOMY  1991   Social History   Occupational History  .  [employer]    [employer]   Social History Main Topics  . Smoking status: Former  Smoker    Packs/day: 2.00    Years: 14.00  . Smokeless tobacco: Former Systems developer    Quit date: 12/19/1983  . Alcohol use 12.6 oz/week    21 Cans of beer per week     Comment: 2-3 beers daily  . Drug use: No  . Sexual activity: Yes    Birth control/ protection: None

## 2016-07-06 ENCOUNTER — Telehealth (INDEPENDENT_AMBULATORY_CARE_PROVIDER_SITE_OTHER): Payer: Self-pay | Admitting: *Deleted

## 2016-07-06 NOTE — Telephone Encounter (Signed)
[facility] calling just to make sure Dr. Sharol Given knows pt is on Dunvial which is simmilar to Suboxone, because pt was prescribed percocet. [phone]

## 2016-07-13 ENCOUNTER — Telehealth (INDEPENDENT_AMBULATORY_CARE_PROVIDER_SITE_OTHER): Payer: Self-pay

## 2016-07-13 NOTE — Telephone Encounter (Signed)
Pt had emailed Darren Gay about disability paperwork. I called the pt to find out what he was needing and the pt states that this has already been done. ciox emailed to him this morning.

## 2016-07-14 ENCOUNTER — Encounter (INDEPENDENT_AMBULATORY_CARE_PROVIDER_SITE_OTHER): Payer: Self-pay | Admitting: Family

## 2016-07-14 ENCOUNTER — Ambulatory Visit (INDEPENDENT_AMBULATORY_CARE_PROVIDER_SITE_OTHER): Payer: 59 | Admitting: Family

## 2016-07-14 VITALS — Ht 71.0 in | Wt 220.0 lb

## 2016-07-14 DIAGNOSIS — M21612 Bunion of left foot: Secondary | ICD-10-CM

## 2016-07-14 NOTE — Progress Notes (Signed)
Office Visit Note   Patient: Darren Gay           Date of Birth: 1950/11/18           MRN: [id number] Visit Date: 07/14/2016              Requested by: Celene Squibb, MD [address] [place], [place] [address] PCP: Wende Neighbors, MD  Chief Complaint  Patient presents with  . Left Foot - Routine Post Op    07/06/16 left foot chevron osteotomy       HPI: Patient is a 68 sexual gentleman who presents today 8 days status post left foot chevron osteotomy. He is using crutches for ambulation. Wearing a postop shoe. Edges and the pin are intact today. The patient denies any pain and has been elevating around-the-clock.  Assessment & Plan: Visit Diagnoses:  1. Bunion, left     Plan: Again daily Dial soap cleansing. Pain the pin track with Neosporin. Cover with a dry dressing and Ace wrap. Continue to minimize weightbearing.  Follow-Up Instructions: Return in about 1 week (around 07/21/2016).   Ortho Exam  Patient is alert, oriented, no adenopathy, well-dressed, normal affect, normal respiratory effort. Incision is clean dry and intact. Pin is intact. Sutures place.  Imaging: No results found.  Labs: Lab Results  Component Value Date   LABURIC 8.4 (H) 10/29/2014    Orders:  No orders of the defined types were placed in this encounter.  No orders of the defined types were placed in this encounter.    Procedures: No procedures performed  Clinical Data: No additional findings.  ROS:  All other systems negative, except as noted in the HPI. Review of Systems  Constitutional: Negative for chills and fever.    Objective: Vital Signs: Ht 5\' 11"  (1.803 m)   Wt 220 lb (99.8 kg)   BMI 30.68 kg/m   Specialty Comments:  No specialty comments available.  PMFS History: Patient Active Problem List   Diagnosis Date Noted  . Bunion, left 06/11/2016  . 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 Hx of adenomatous colonic polyps 03/29/2014  .  Chest pain 01/13/2013  . Fever 01/13/2013  . Dehydration 01/13/2013  . Plantar fasciitis of right foot 01/21/2012  . TESTICULAR HYPOFUNCTION 06/13/2009  . FATIGUE 06/04/2009  . HYPERTENSION 06/03/2008  . DEPRESSION 12/26/2006  . ALLERGIC RHINITIS 10/20/2006  . SLEEP APNEA 10/20/2006   Past Medical History:  Diagnosis Date  . Allergy   . Depression   . High cholesterol    borderline  . Hx of adenomatous colonic polyps 03/29/2014  . Hypertension   . Substance abuse     Family History  Problem Relation Age of Onset  . Skin cancer Father   . Hypertension Father   . Breast cancer Mother   . Stomach cancer Mother   . Pancreatic cancer Brother   . Colon cancer Neg Hx   . Esophageal cancer Neg Hx   . Rectal cancer Neg Hx     Past Surgical History:  Procedure Laterality Date  . CARPAL TUNNEL RELEASE Bilateral   . COLONOSCOPY    . KNEE ARTHROSCOPY  2010  . TONSILLECTOMY  1960  . VASECTOMY  1991   Social History   Occupational History  .  [employer]    [employer]   Social History Main Topics  . Smoking status: Former Smoker    Packs/day: 2.00    Years: 14.00  .  Smokeless tobacco: Former Systems developer    Quit date: 12/19/1983  . Alcohol use 12.6 oz/week    21 Cans of beer per week     Comment: 2-3 beers daily  . Drug use: No  . Sexual activity: Yes    Birth control/ protection: None

## 2016-07-15 ENCOUNTER — Ambulatory Visit (INDEPENDENT_AMBULATORY_CARE_PROVIDER_SITE_OTHER): Payer: 59 | Admitting: Orthopedic Surgery

## 2016-07-19 ENCOUNTER — Ambulatory Visit (INDEPENDENT_AMBULATORY_CARE_PROVIDER_SITE_OTHER): Payer: 59 | Admitting: Orthopedic Surgery

## 2016-07-19 DIAGNOSIS — M21612 Bunion of left foot: Secondary | ICD-10-CM

## 2016-07-19 NOTE — Progress Notes (Signed)
Office Visit Note   Patient: Darren Gay           Date of Birth: 08-Oct-1950           MRN: [id number] Visit Date: 07/19/2016              Requested by: Celene Squibb, MD [address] [place], [place] [address] PCP: Wende Neighbors, MD  No chief complaint on file.     HPI: 66 year old gentleman seen 2 weeks status post bunionectomy on the left. He has been touchdown weightbearing in a postop shoe with crutches on the left.  Assessment & Plan: Visit Diagnoses:  1. Bunion, left     Plan: Pin pulled today. Sutures will be harvested. Patient will continue with dry dressings may be full weightbearing in his postop shoe on the left. Follow-up in 2 weeks.  Follow-Up Instructions: Return in about 2 weeks (around 08/02/2016).   Ortho Exam  Patient is alert, oriented, no adenopathy, well-dressed, normal affect, normal respiratory effort. Incision is clean dry and intact. There is no gaping no redness no drainage no sign of infection.  Imaging: No results found.  Labs: Lab Results  Component Value Date   LABURIC 8.4 (H) 10/29/2014    Orders:  No orders of the defined types were placed in this encounter.  No orders of the defined types were placed in this encounter.    Procedures: No procedures performed  Clinical Data: No additional findings.  ROS:  All other systems negative, except as noted in the HPI. Review of Systems  Constitutional: Negative for chills and fever.    Objective: Vital Signs: There were no vitals taken for this visit.  Specialty Comments:  No specialty comments available.  PMFS History: Patient Active Problem List   Diagnosis Date Noted  . Bunion, left 06/11/2016  . 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 Hx of adenomatous colonic polyps 03/29/2014  . Chest pain 01/13/2013  . Fever 01/13/2013  . Dehydration 01/13/2013  . Plantar fasciitis of right foot 01/21/2012  . TESTICULAR HYPOFUNCTION 06/13/2009  .  FATIGUE 06/04/2009  . HYPERTENSION 06/03/2008  . DEPRESSION 12/26/2006  . ALLERGIC RHINITIS 10/20/2006  . SLEEP APNEA 10/20/2006   Past Medical History:  Diagnosis Date  . Allergy   . Depression   . High cholesterol    borderline  . Hx of adenomatous colonic polyps 03/29/2014  . Hypertension   . Substance abuse     Family History  Problem Relation Age of Onset  . Skin cancer Father   . Hypertension Father   . Breast cancer Mother   . Stomach cancer Mother   . Pancreatic cancer Brother   . Colon cancer Neg Hx   . Esophageal cancer Neg Hx   . Rectal cancer Neg Hx     Past Surgical History:  Procedure Laterality Date  . CARPAL TUNNEL RELEASE Bilateral   . COLONOSCOPY    . KNEE ARTHROSCOPY  2010  . TONSILLECTOMY  1960  . VASECTOMY  1991   Social History   Occupational History  .  [employer]    [employer]   Social History Main Topics  . Smoking status: Former Smoker    Packs/day: 2.00    Years: 14.00  . Smokeless tobacco: Former Systems developer    Quit date: 12/19/1983  . Alcohol use 12.6 oz/week    21 Cans of beer per week     Comment: 2-3 beers daily  .  Drug use: No  . Sexual activity: Yes    Birth control/ protection: None

## 2016-08-02 ENCOUNTER — Encounter (INDEPENDENT_AMBULATORY_CARE_PROVIDER_SITE_OTHER): Payer: Self-pay | Admitting: Orthopedic Surgery

## 2016-08-02 ENCOUNTER — Ambulatory Visit (INDEPENDENT_AMBULATORY_CARE_PROVIDER_SITE_OTHER): Payer: 59 | Admitting: Orthopedic Surgery

## 2016-08-02 VITALS — Ht 71.0 in | Wt 220.0 lb

## 2016-08-02 DIAGNOSIS — M21612 Bunion of left foot: Secondary | ICD-10-CM

## 2016-08-02 DIAGNOSIS — M1A0711 Idiopathic chronic gout, right ankle and foot, with tophus (tophi): Secondary | ICD-10-CM

## 2016-08-02 NOTE — Progress Notes (Signed)
Office Visit Note   Patient: Darren Gay           Date of Birth: 09/28/1950           MRN: [id number] Visit Date: 08/02/2016              Requested by: Celene Squibb, MD [address] [place], [place] [address] PCP: Wende Neighbors, MD  Chief Complaint  Patient presents with  . Left Foot - Routine Post Op    Left foot bunionectomy      HPI: Patient is status post chevron osteotomy left great toe. He complains of some red gouty swelling around the toe also complains of a painful tophaceous gouty mass on the dorsum of the IP joint of the right great toe.  Assessment & Plan: Visit Diagnoses:  1. Bunion, left   2. 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 We will set this up at his convenience. Risk and benefits of surgery were discussed patient states he understands wishes to proceed at this time.  Follow-Up Instructions: Return in about 4 weeks (around 08/30/2016).   Ortho Exam  Patient is alert, oriented, no adenopathy, well-dressed, normal affect, normal respiratory effort. Examination patient does have some redness and swelling around the MTP joint of the left great toe this is nontender to palpation. Patient was given a spacer to place the first webspace. He is currently wearing flip-flops recommended a stiffer soled supportive shoe. Examination of right foot he has tophaceous mass without redness without cellulitis without drainage over the dorsum of the IP joint this up proximally 7 mm in diameter. Patient complains with pain with activities of daily living.  Imaging: No results found.  Labs: Lab Results  Component Value Date   LABURIC 8.4 (H) 10/29/2014    Orders:  No orders of the defined types were placed in this encounter.  No orders of the defined types were placed in this encounter.    Procedures: No procedures  performed  Clinical Data: No additional findings.  ROS:  All other systems negative, except as noted in the HPI. Review of Systems  Objective: Vital Signs: Ht 5\' 11"  (1.803 m)   Wt 220 lb (99.8 kg)   BMI 30.68 kg/m   Specialty Comments:  No specialty comments available.  PMFS History: Patient Active Problem List   Diagnosis Date Noted  . Bunion, left 06/11/2016  . 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 Hx of adenomatous colonic polyps 03/29/2014  . Chest pain 01/13/2013  . Fever 01/13/2013  . Dehydration 01/13/2013  . Plantar fasciitis of right foot 01/21/2012  . TESTICULAR HYPOFUNCTION 06/13/2009  . FATIGUE 06/04/2009  . HYPERTENSION 06/03/2008  . DEPRESSION 12/26/2006  . ALLERGIC RHINITIS 10/20/2006  . SLEEP APNEA 10/20/2006   Past Medical History:  Diagnosis Date  . Allergy   . Depression   . High cholesterol    borderline  . Hx of adenomatous colonic polyps 03/29/2014  . Hypertension   . Substance abuse     Family History  Problem Relation Age of Onset  . Skin cancer Father   . Hypertension Father   . Breast cancer Mother   . Stomach cancer Mother   . Pancreatic cancer Brother   . Colon cancer Neg Hx   . Esophageal cancer Neg Hx   .  Rectal cancer Neg Hx     Past Surgical History:  Procedure Laterality Date  . CARPAL TUNNEL RELEASE Bilateral   . COLONOSCOPY    . KNEE ARTHROSCOPY  2010  . TONSILLECTOMY  1960  . VASECTOMY  1991   Social History   Occupational History  .  [employer]    [employer]   Social History Main Topics  . Smoking status: Former Smoker    Packs/day: 2.00    Years: 14.00  . Smokeless tobacco: Former Systems developer    Quit date: 12/19/1983  . Alcohol use 12.6 oz/week    21 Cans of beer per week     Comment: 2-3 beers daily  . Drug use: No  . Sexual activity: Yes    Birth control/ protection: None

## 2016-08-10 ENCOUNTER — Telehealth (INDEPENDENT_AMBULATORY_CARE_PROVIDER_SITE_OTHER): Payer: Self-pay | Admitting: Orthopedic Surgery

## 2016-08-10 NOTE — Telephone Encounter (Signed)
Would advise not driving for 48 hours after surgery.

## 2016-08-10 NOTE — Telephone Encounter (Signed)
Darren Gay is scheduled for excision tophaceous mass right great toe, for gout on Tuesday 08/24/2016.  He would like to know what his restrictions will be after surgery. He is concerned about not being able to drive since this is his right foot.  What limitations will he have?

## 2016-08-11 NOTE — Telephone Encounter (Signed)
I called and left detailed message on Darren Gay voicemail (per his request).

## 2016-08-24 DIAGNOSIS — R2241 Localized swelling, mass and lump, right lower limb: Secondary | ICD-10-CM | POA: Diagnosis not present

## 2016-08-24 DIAGNOSIS — M109 Gout, unspecified: Secondary | ICD-10-CM | POA: Diagnosis not present

## 2016-09-07 ENCOUNTER — Ambulatory Visit (INDEPENDENT_AMBULATORY_CARE_PROVIDER_SITE_OTHER): Payer: 59 | Admitting: Family

## 2016-09-07 ENCOUNTER — Encounter (INDEPENDENT_AMBULATORY_CARE_PROVIDER_SITE_OTHER): Payer: Self-pay | Admitting: Family

## 2016-09-07 ENCOUNTER — Inpatient Hospital Stay (INDEPENDENT_AMBULATORY_CARE_PROVIDER_SITE_OTHER): Payer: 59 | Admitting: Orthopedic Surgery

## 2016-09-07 VITALS — Ht 71.0 in | Wt 220.0 lb

## 2016-09-07 DIAGNOSIS — M1A0720 Idiopathic chronic gout, left ankle and foot, without tophus (tophi): Secondary | ICD-10-CM

## 2016-09-07 NOTE — Progress Notes (Signed)
   Post-Op Visit Note   Patient: Darren Gay           Date of Birth: 1951/03/09           MRN: [id number] Visit Date: 09/07/2016 PCP: Celene Squibb, MD  Chief Complaint:  Chief Complaint  Patient presents with  . Right Foot - Routine Post Op    08/24/16 right GT IP joint excision tophaceous gout     HPI:  The patient is a 66 year old gentleman who is seen today in follow-up he is status post excision of tophaceous mass right great toe at the IP joint on May 15. Sutures were harvested today without incident. He has resumed regular shoewear. Continues to have some swelling and some throbbing pain.    Ortho Exam An incision well approximated healing well. There is no gaping no drainage no odor no sign of infection. Does have moderate swelling and mild erythema.  Visit Diagnoses:  1. Idiopathic chronic gout of left foot without tophus     Plan: We'll discuss return to work status at next visit. Continue with daily wound cleansing. Cover with dry dressing. Weightbearing as tolerated in regular shoewear.  Follow-Up Instructions: Return in about 2 weeks (around 09/21/2016).   Imaging: No results found.  Orders:  No orders of the defined types were placed in this encounter.  No orders of the defined types were placed in this encounter.    PMFS History: Patient Active Problem List   Diagnosis Date Noted  . Bunion, left 06/11/2016  . 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 Hx of adenomatous colonic polyps 03/29/2014  . Chest pain 01/13/2013  . Fever 01/13/2013  . Dehydration 01/13/2013  . Plantar fasciitis of right foot 01/21/2012  . TESTICULAR HYPOFUNCTION 06/13/2009  . FATIGUE 06/04/2009  . HYPERTENSION 06/03/2008  . DEPRESSION 12/26/2006  . ALLERGIC RHINITIS 10/20/2006  . SLEEP APNEA 10/20/2006   Past Medical History:  Diagnosis Date  . Allergy   . Depression   . High cholesterol    borderline  . Hx of adenomatous colonic polyps  03/29/2014  . Hypertension   . Substance abuse     Family History  Problem Relation Age of Onset  . Skin cancer Father   . Hypertension Father   . Breast cancer Mother   . Stomach cancer Mother   . Pancreatic cancer Brother   . Colon cancer Neg Hx   . Esophageal cancer Neg Hx   . Rectal cancer Neg Hx     Past Surgical History:  Procedure Laterality Date  . CARPAL TUNNEL RELEASE Bilateral   . COLONOSCOPY    . KNEE ARTHROSCOPY  2010  . TONSILLECTOMY  1960  . VASECTOMY  1991   Social History   Occupational History  .  [employer]    [employer]   Social History Main Topics  . Smoking status: Former Smoker    Packs/day: 2.00    Years: 14.00  . Smokeless tobacco: Former Systems developer    Quit date: 12/19/1983  . Alcohol use 12.6 oz/week    21 Cans of beer per week     Comment: 2-3 beers daily  . Drug use: No  . Sexual activity: Yes    Birth control/ protection: None

## 2016-09-21 ENCOUNTER — Encounter (INDEPENDENT_AMBULATORY_CARE_PROVIDER_SITE_OTHER): Payer: Self-pay | Admitting: Orthopedic Surgery

## 2016-09-21 ENCOUNTER — Ambulatory Visit (INDEPENDENT_AMBULATORY_CARE_PROVIDER_SITE_OTHER): Payer: 59 | Admitting: Orthopedic Surgery

## 2016-09-21 VITALS — Ht 71.0 in | Wt 200.0 lb

## 2016-09-21 DIAGNOSIS — M1A0711 Idiopathic chronic gout, right ankle and foot, with tophus (tophi): Secondary | ICD-10-CM

## 2016-09-21 DIAGNOSIS — M21612 Bunion of left foot: Secondary | ICD-10-CM

## 2016-09-21 NOTE — Progress Notes (Signed)
Office Visit Note   Patient: Darren Gay           Date of Birth: [birth year]           MRN: [id number] Visit Date: 09/21/2016              Requested by: Celene Squibb, MD [address] [place], [place] [address] PCP: Celene Squibb, MD  Chief Complaint  Patient presents with  . Right Foot - Routine Post Op    08/24/16 right GT IP JT excision tophaceous gout      HPI: Patient is status post excision of tophaceous gout right great toe and status post bunion surgery in the left. He currently takes allopurinol 100 mg twice a day. He is about 3 weeks out from surgery.  Assessment & Plan: Visit Diagnoses:  1. Bunion, left   2. 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 Patient is recommended to wear knee-high 15-20 mm compression stockings daily. Recommended the use the spacer in the first webspace left foot patient was given Coban and an shown how to wrap the great toe on the right to decrease the swelling.  Follow-Up Instructions: Return in about 4 weeks (around 10/19/2016).   Ortho Exam  Patient is alert, oriented, no adenopathy, well-dressed, normal affect, normal respiratory effort. Examination patient is developing a little bit of overlapping of the great toe and second toe left foot was given a spacer to prevent this. Examination of right foot he does have swelling his toe was wrapped in Coban and. Patient has pitting edema up to the tibial tubercle bilaterally patient states he's had chronic swelling in both legs and recommended compression stockings instead of a fluid pill.  Imaging: No results found.  Labs: Lab Results  Component Value Date   LABURIC 8.4 (H) 10/29/2014    Orders:  No orders of the defined types were placed in this encounter.  No orders of the defined types were placed in this encounter.    Procedures: No procedures performed  Clinical Data: No additional findings.  ROS:  All other systems  negative, except as noted in the HPI. Review of Systems  Objective: Vital Signs: Ht 5\' 11"  (1.803 m)   Wt 200 lb (90.7 kg)   BMI 27.89 kg/m   Specialty Comments:  No specialty comments available.  PMFS History: Patient Active Problem List   Diagnosis Date Noted  . Bunion, left 06/11/2016  . 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 Hx of adenomatous colonic polyps 03/29/2014  . Chest pain 01/13/2013  . Fever 01/13/2013  . Dehydration 01/13/2013  . Plantar fasciitis of right foot 01/21/2012  . TESTICULAR HYPOFUNCTION 06/13/2009  . FATIGUE 06/04/2009  . HYPERTENSION 06/03/2008  . DEPRESSION 12/26/2006  . ALLERGIC RHINITIS 10/20/2006  . SLEEP APNEA 10/20/2006   Past Medical History:  Diagnosis Date  . Allergy   . Depression   . High cholesterol    borderline  . Hx of adenomatous colonic polyps 03/29/2014  . Hypertension   . Substance abuse     Family History  Problem Relation Age of Onset  . Skin cancer Father   . Hypertension Father   . Breast cancer Mother   . Stomach cancer Mother   . Pancreatic cancer Brother   . Colon cancer Neg Hx   . Esophageal cancer Neg Hx   . Rectal cancer Neg Hx  Past Surgical History:  Procedure Laterality Date  . CARPAL TUNNEL RELEASE Bilateral   . COLONOSCOPY    . KNEE ARTHROSCOPY  2010  . TONSILLECTOMY  1960  . VASECTOMY  1991   Social History   Occupational History  .  [employer]    [employer]   Social History Main Topics  . Smoking status: Former Smoker    Packs/day: 2.00    Years: 14.00  . Smokeless tobacco: Former Systems developer    Quit date: 12/19/1983  . Alcohol use 12.6 oz/week    21 Cans of beer per week     Comment: 2-3 beers daily  . Drug use: No  . Sexual activity: Yes    Birth control/ protection: None

## 2016-10-19 ENCOUNTER — Ambulatory Visit (INDEPENDENT_AMBULATORY_CARE_PROVIDER_SITE_OTHER): Payer: 59

## 2016-10-19 ENCOUNTER — Ambulatory Visit (INDEPENDENT_AMBULATORY_CARE_PROVIDER_SITE_OTHER): Payer: 59 | Admitting: Orthopedic Surgery

## 2016-10-19 ENCOUNTER — Encounter (INDEPENDENT_AMBULATORY_CARE_PROVIDER_SITE_OTHER): Payer: Self-pay | Admitting: Orthopedic Surgery

## 2016-10-19 DIAGNOSIS — M79672 Pain in left foot: Secondary | ICD-10-CM | POA: Diagnosis not present

## 2016-10-19 DIAGNOSIS — M79671 Pain in right foot: Secondary | ICD-10-CM

## 2016-10-19 NOTE — Progress Notes (Signed)
Office Visit Note   Patient: Darren Gay           Date of Birth: 11/23/1950           MRN: [id number] Visit Date: 10/19/2016              Requested by: Celene Squibb, MD [address] [place], [place] [address] PCP: Celene Squibb, MD  Chief Complaint  Patient presents with  . Right Foot - Follow-up  . Left Foot - Follow-up      HPI: Patient presents status post excision of the IP joint right great toe for ulceration and swelling.  Assessment & Plan: Visit Diagnoses:  1. Pain in left foot   2. Pain in right foot     Plan: Patient is been increasing his activities and has been having increased swelling to the great toe. We will keep him out of work for 4 additional weeks follow-up in 4 weeks for evaluation for return to work. Patient will continue with his compression stockings.  Follow-Up Instructions: Return in about 4 weeks (around 11/16/2016).   Ortho Exam  Patient is alert, oriented, no adenopathy, well-dressed, normal affect, normal respiratory effort. Examination patient has a normal gait. He has good pulses there is venous changes with swelling of the right great toe without cellulitis without drainage no signs of infection clinically. Patient is pleased with his progress other than the recent increased swelling.  Imaging: Xr Foot 2 Views Left  Result Date: 10/19/2016 Two-view radiographs of the left foot shows hypertrophic callus through the shaft of the first metatarsal. There is a congruent MTP joint. Radiographs on the lateral view also shows calcification of the vessels past the ankle with osteophytic bone spurs through the midfoot on the left.  Xr Foot 2 Views Right  Result Date: 10/19/2016 Two-view Ray Gres the right foot shows the surgical resection of the IP joint. The toe is straight there is no malalignment no other destructive bony changes. Lateral view shows calcification of the vessels past the ankle as well as osteophytic bone spurs through the  midfoot.   Labs: Lab Results  Component Value Date   LABURIC 8.4 (H) 10/29/2014    Orders:  Orders Placed This Encounter  Procedures  . XR Foot 2 Views Left  . XR Foot 2 Views Right   No orders of the defined types were placed in this encounter.    Procedures: No procedures performed  Clinical Data: No additional findings.  ROS:  All other systems negative, except as noted in the HPI. Review of Systems  Objective: Vital Signs: There were no vitals taken for this visit.  Specialty Comments:  No specialty comments available.  PMFS History: Patient Active Problem List   Diagnosis Date Noted  . Bunion, left 06/11/2016  . 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 Hx of adenomatous colonic polyps 03/29/2014  . Chest pain 01/13/2013  . Fever 01/13/2013  . Dehydration 01/13/2013  . Plantar fasciitis of right foot 01/21/2012  . TESTICULAR HYPOFUNCTION 06/13/2009  . FATIGUE 06/04/2009  . HYPERTENSION 06/03/2008  . DEPRESSION 12/26/2006  . ALLERGIC RHINITIS 10/20/2006  . SLEEP APNEA 10/20/2006   Past Medical History:  Diagnosis Date  . Allergy   . Depression   . High cholesterol    borderline  . Hx of adenomatous colonic polyps 03/29/2014  . Hypertension   . Substance abuse     Family History  Problem Relation Age of Onset  .  Skin cancer Father   . Hypertension Father   . Breast cancer Mother   . Stomach cancer Mother   . Pancreatic cancer Brother   . Colon cancer Neg Hx   . Esophageal cancer Neg Hx   . Rectal cancer Neg Hx     Past Surgical History:  Procedure Laterality Date  . CARPAL TUNNEL RELEASE Bilateral   . COLONOSCOPY    . KNEE ARTHROSCOPY  2010  . TONSILLECTOMY  1960  . VASECTOMY  1991   Social History   Occupational History  .  [employer]    [employer]   Social History Main Topics  . Smoking status: Former Smoker    Packs/day: 2.00    Years: 14.00  . Smokeless tobacco: Former Systems developer    Quit  date: 12/19/1983  . Alcohol use 12.6 oz/week    21 Cans of beer per week     Comment: 2-3 beers daily  . Drug use: No  . Sexual activity: Yes    Birth control/ protection: None

## 2016-11-11 ENCOUNTER — Encounter: Payer: Self-pay | Admitting: Physician Assistant

## 2016-11-11 ENCOUNTER — Ambulatory Visit (INDEPENDENT_AMBULATORY_CARE_PROVIDER_SITE_OTHER): Payer: 59

## 2016-11-11 ENCOUNTER — Ambulatory Visit (INDEPENDENT_AMBULATORY_CARE_PROVIDER_SITE_OTHER): Payer: 59 | Admitting: Physician Assistant

## 2016-11-11 VITALS — BP 147/86 | HR 78 | Temp 99.0°F | Resp 16 | Ht 72.0 in | Wt 219.2 lb

## 2016-11-11 DIAGNOSIS — S92351K Displaced fracture of fifth metatarsal bone, right foot, subsequent encounter for fracture with nonunion: Secondary | ICD-10-CM | POA: Diagnosis not present

## 2016-11-11 DIAGNOSIS — M79671 Pain in right foot: Secondary | ICD-10-CM

## 2016-11-11 DIAGNOSIS — R030 Elevated blood-pressure reading, without diagnosis of hypertension: Secondary | ICD-10-CM | POA: Diagnosis not present

## 2016-11-11 LAB — POCT CBC
Granulocyte percent: 55.4 %G (ref 37–80)
HEMATOCRIT: 45 % (ref 43.5–53.7)
Hemoglobin: 15.4 g/dL (ref 14.1–18.1)
LYMPH, POC: 3.4 (ref 0.6–3.4)
MCH, POC: 31.1 pg (ref 27–31.2)
MCHC: 34.2 g/dL (ref 31.8–35.4)
MCV: 90.7 fL (ref 80–97)
MID (CBC): 0.2 (ref 0–0.9)
MPV: 6.9 fL (ref 0–99.8)
POC GRANULOCYTE: 4.5 (ref 2–6.9)
POC LYMPH %: 42.1 % (ref 10–50)
POC MID %: 2.5 % (ref 0–12)
Platelet Count, POC: 214 10*3/uL (ref 142–424)
RBC: 4.96 M/uL (ref 4.69–6.13)
RDW, POC: 13.7 %
WBC: 8.1 10*3/uL (ref 4.6–10.2)

## 2016-11-11 NOTE — Progress Notes (Signed)
11/11/2016 5:53 PM   DOB: 1950-12-29 / MRN: [id number]  SUBJECTIVE:  Darren Gay is a 66 y.o. male presenting for lateral left foot pain that started a few days ago while walking.  Tells me the pain is getting worse, particularly with ambulation. He has broken several bones over the years in the same foot.  Has long history of smoking.  Has multiple post up shoes and crutches at home already.   He has No Known Allergies.   He  has a past medical history of Allergy; Depression; High cholesterol; adenomatous colonic polyps (03/29/2014); Hypertension; and Substance abuse.    He  reports that he has quit smoking. He has a 28.00 pack-year smoking history. He quit smokeless tobacco use about 32 years ago. He reports that he drinks about 12.6 oz of alcohol per week . He reports that he does not use drugs. He  reports that he currently engages in sexual activity. He reports using the following method of birth control/protection: None. The patient  has a past surgical history that includes Knee arthroscopy (2010); Vasectomy (1991); Tonsillectomy (1960); Carpal tunnel release (Bilateral); and Colonoscopy.  His family history includes Breast cancer in his mother; Hypertension in his father; Pancreatic cancer in his brother; Skin cancer in his father; Stomach cancer in his mother.  Review of Systems  Constitutional: Negative for chills, diaphoresis and fever.  Respiratory: Negative for shortness of breath.   Cardiovascular: Negative for chest pain, orthopnea and leg swelling.  Gastrointestinal: Negative for nausea.  Musculoskeletal: Positive for joint pain. Negative for back pain, falls, myalgias and neck pain.  Skin: Negative for rash.  Neurological: Negative for dizziness.    The problem list and medications were reviewed and updated by myself where necessary and exist elsewhere in the encounter.   OBJECTIVE:  BP (!) 147/86 (BP Location: Right Arm, Patient Position: Sitting, Cuff Size: Large)    Pulse 78   Temp 99 F (37.2 C) (Oral)   Resp 16   Ht 6' (1.829 m)   Wt 219 lb 3.2 oz (99.4 kg)   SpO2 97%   BMI 29.73 kg/m   Physical Exam  Constitutional: He is oriented to person, place, and time.  Cardiovascular: Normal rate and regular rhythm.   Pulmonary/Chest: Effort normal and breath sounds normal.  Musculoskeletal: Normal range of motion. He exhibits tenderness (left distal fifth metatarsall, negative for crepitus).  Neurological: He is alert and oriented to person, place, and time.    Results for orders placed or performed in visit on 11/11/16 (from the past 72 hour(s))  POCT CBC     Status: None   Collection Time: 11/11/16  5:32 PM  Result Value Ref Range   WBC 8.1 4.6 - 10.2 K/uL   Lymph, poc 3.4 0.6 - 3.4   POC LYMPH PERCENT 42.1 10 - 50 %L   MID (cbc) 0.2 0 - 0.9   POC MID % 2.5 0 - 12 %M   POC Granulocyte 4.5 2 - 6.9   Granulocyte percent 55.4 37 - 80 %G   RBC 4.96 4.69 - 6.13 M/uL   Hemoglobin 15.4 14.1 - 18.1 g/dL   HCT, POC 45.0 43.5 - 53.7 %   MCV 90.7 80 - 97 fL   MCH, POC 31.1 27 - 31.2 pg   MCHC 34.2 31.8 - 35.4 g/dL   RDW, POC 13.7 %   Platelet Count, POC 214 142 - 424 K/uL   MPV 6.9 0 - 99.8 fL  Dg Foot Complete Right  Result Date: 11/11/2016 CLINICAL DATA:  Right 5th metatarsal pain, no acute injury EXAM: RIGHT FOOT COMPLETE - 3+ VIEW COMPARISON:  10/19/2016 FINDINGS: Nondisplaced fracture of the distal 5th metatarsal shaft. Healed fracture of the proximal 4th metatarsal shaft. Destructive changes of the distal aspect of the 1st proximal phalanx at the IP joint, unchanged. Mild widening of the Lisfranc joint with underlying degenerative changes at the 2nd tarsometatarsal joint, grossly unchanged. Degenerative changes of the dorsal forefoot. IMPRESSION: Nondisplaced fracture of the distal 5th metatarsal shaft. Destructive changes involving the 1st proximal phalanx at the IP joint, unchanged. Inflammatory arthropathy or septic joint is possible.  Degenerative changes with mild widening at the Lisfranc joint, grossly unchanged. Electronically Signed   By: Julian Hy M.D.   On: 11/11/2016 17:06    ASSESSMENT AND PLAN:  Darren Gay was seen today for foot injury.  Diagnoses and all orders for this visit:  Right foot pain -     DG Foot Complete Right; Future -     Sedimentation Rate -     Uric Acid  Elevated blood pressure reading  Closed displaced fracture of fifth metatarsal bone of right foot with nonunion, subsequent encounter: Rads calling possible septic arthritis, however CBC and sed rate certainly do not point in this direction.  Advised he get back to Dr. Sharol Given ASAP as he has performed surgery for Darren Gay in the past for similar problems.  Patient does see Dr. Nevada Crane for PCP. He would benefit from calicum and vitamin D screenings. -     POCT CBC -     Ambulatory referral to Orthopedic Surgery    The patient is advised to call or return to clinic if he does not see an improvement in symptoms, or to seek the care of the closest emergency department if he worsens with the above plan.   Philis Fendt, MHS, PA-C [facility] 11/11/2016 5:53 PM

## 2016-11-11 NOTE — Patient Instructions (Addendum)
Please call Dr. Jess Barters office in the morning and do not bear weight until cleared by him. Please follow up with your PCP regarding you blood pressure. The xray read is as below.   Dg Foot Complete Right  Result Date: 11/11/2016 CLINICAL DATA:  Right 5th metatarsal pain, no acute injury EXAM: RIGHT FOOT COMPLETE - 3+ VIEW COMPARISON:  10/19/2016 FINDINGS: Nondisplaced fracture of the distal 5th metatarsal shaft. Healed fracture of the proximal 4th metatarsal shaft. Destructive changes of the distal aspect of the 1st proximal phalanx at the IP joint, unchanged. Mild widening of the Lisfranc joint with underlying degenerative changes at the 2nd tarsometatarsal joint, grossly unchanged. Degenerative changes of the dorsal forefoot. IMPRESSION: Nondisplaced fracture of the distal 5th metatarsal shaft. Destructive changes involving the 1st proximal phalanx at the IP joint, unchanged. Inflammatory arthropathy or septic joint is possible. Degenerative changes with mild widening at the Lisfranc joint, grossly unchanged. Electronically Signed   By: Julian Hy M.D.   On: 11/11/2016 17:06      IF you received an x-ray today, you will receive an invoice from [facility]. Please contact [facility] at [phone] with questions or concerns regarding your invoice.   IF you received labwork today, you will receive an invoice from [facility]. Please contact [facility] at [phone] with questions or concerns regarding your invoice.   Our billing staff will not be able to assist you with questions regarding bills from these companies.  You will be contacted with the lab results as soon as they are available. The fastest way to get your results is to activate your My Chart account. Instructions are located on the last page of this paperwork. If you have not heard from [place] regarding the results in 2 weeks, please contact this office.

## 2016-11-12 ENCOUNTER — Telehealth (INDEPENDENT_AMBULATORY_CARE_PROVIDER_SITE_OTHER): Payer: Self-pay | Admitting: Orthopedic Surgery

## 2016-11-12 LAB — SEDIMENTATION RATE: Sed Rate: 2 mm/hr (ref 0–30)

## 2016-11-12 LAB — URIC ACID: Uric Acid: 5 mg/dL (ref 3.7–8.6)

## 2016-11-12 NOTE — Telephone Encounter (Signed)
10/19/2016 ov note faxed to [employer] [phone]

## 2016-11-15 ENCOUNTER — Encounter (INDEPENDENT_AMBULATORY_CARE_PROVIDER_SITE_OTHER): Payer: Self-pay | Admitting: Orthopedic Surgery

## 2016-11-15 ENCOUNTER — Ambulatory Visit (INDEPENDENT_AMBULATORY_CARE_PROVIDER_SITE_OTHER): Payer: 59 | Admitting: Family

## 2016-11-15 VITALS — Ht 72.0 in | Wt 219.0 lb

## 2016-11-15 DIAGNOSIS — S92354A Nondisplaced fracture of fifth metatarsal bone, right foot, initial encounter for closed fracture: Secondary | ICD-10-CM

## 2016-11-15 NOTE — Progress Notes (Signed)
Office Visit Note   Patient: Darren Gay           Date of Birth: 12/02/1950           MRN: [id number] Visit Date: 11/15/2016              Requested by: Celene Squibb, MD [address] [place], [place] [address] PCP: Celene Squibb, MD  Chief Complaint  Patient presents with  . Right Foot - Routine Post Op    08/24/16 right GT IP JT excision tophaceous gout. Uric acid level 5.0 11/11/16 also states fx 5th MT treated at [facility] last week xrays obtained.       HPI: The patient is a 66 year old gentleman who presents today for evaluation of a right fifth metatarsal fracture and he was walking in home he twisted his ankle. Doesn't pop in his foot. Went to urgent care over the weekend radiographs were reviewed for fifth metatarsal fracture. He is a postop shoe has been full weightbearing. Does remark that weightbearing is quite painful.  Has a history of gout his last uric acid him earlier last week was a 5.0  Assessment & Plan: Visit Diagnoses:  1. Nondisplaced fracture of fifth metatarsal bone, right foot, initial encounter for closed fracture     Plan: continue post op shoe. Use crutches for next 2 weeks. nonweight bearing. Follow up in 2 weeks with repeat radiographs of right foot.   Follow-Up Instructions: Return in about 2 weeks (around 11/29/2016).   Ortho Exam  Patient is alert, oriented, no adenopathy, well-dressed, normal affect, normal respiratory effort. Minimal swelling to right foot. Does have tenderness to distal 5th MT. No ecchymosis. Skin intact.  Imaging: No results found.  Labs: Lab Results  Component Value Date   ESRSEDRATE 2 11/11/2016   LABURIC 5.0 11/11/2016   LABURIC 8.4 (H) 10/29/2014    Orders:  No orders of the defined types were placed in this encounter.  No orders of the defined types were placed in this encounter.    Procedures: No procedures performed  Clinical Data: No additional findings.  ROS:  All other systems negative, except as  noted in the HPI. Review of Systems  Constitutional: Negative for chills and fever.  Musculoskeletal: Positive for arthralgias.  Skin: Negative for color change and wound.    Objective: Vital Signs: Ht 6' (1.829 m)   Wt 219 lb (99.3 kg)   BMI 29.70 kg/m   Specialty Comments:  No specialty comments available.  PMFS History: Patient Active Problem List   Diagnosis Date Noted  . Bunion, left 06/11/2016  . 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 Hx of adenomatous colonic polyps 03/29/2014  . Chest pain 01/13/2013  . Fever 01/13/2013  . Dehydration 01/13/2013  . Plantar fasciitis of right foot 01/21/2012  . TESTICULAR HYPOFUNCTION 06/13/2009  . FATIGUE 06/04/2009  . HYPERTENSION 06/03/2008  . DEPRESSION 12/26/2006  . ALLERGIC RHINITIS 10/20/2006  . SLEEP APNEA 10/20/2006   Past Medical History:  Diagnosis Date  . Allergy   . Depression   . High cholesterol    borderline  . Hx of adenomatous colonic polyps 03/29/2014  . Hypertension   . Substance abuse     Family History  Problem Relation Age of Onset  . Skin cancer Father   . Hypertension Father   . Breast cancer Mother   . Stomach cancer Mother   . Pancreatic cancer Brother   . Colon cancer Neg  Hx   . Esophageal cancer Neg Hx   . Rectal cancer Neg Hx     Past Surgical History:  Procedure Laterality Date  . CARPAL TUNNEL RELEASE Bilateral   . COLONOSCOPY    . KNEE ARTHROSCOPY  2010  . TONSILLECTOMY  1960  . VASECTOMY  1991   Social History   Occupational History  .  [employer]    [employer]   Social History Main Topics  . Smoking status: Former Smoker    Packs/day: 2.00    Years: 14.00  . Smokeless tobacco: Former Systems developer    Quit date: 12/19/1983  . Alcohol use 12.6 oz/week    21 Cans of beer per week     Comment: 2-3 beers daily  . Drug use: No  . Sexual activity: Yes    Birth control/ protection: None

## 2016-12-06 ENCOUNTER — Ambulatory Visit (INDEPENDENT_AMBULATORY_CARE_PROVIDER_SITE_OTHER): Payer: 59

## 2016-12-06 ENCOUNTER — Ambulatory Visit (INDEPENDENT_AMBULATORY_CARE_PROVIDER_SITE_OTHER): Payer: 59 | Admitting: Orthopedic Surgery

## 2016-12-06 DIAGNOSIS — S92354D Nondisplaced fracture of fifth metatarsal bone, right foot, subsequent encounter for fracture with routine healing: Secondary | ICD-10-CM

## 2016-12-06 NOTE — Progress Notes (Signed)
Office Visit Note   Patient: Darren Gay           Date of Birth: 02/25/1951           MRN: [id number] Visit Date: 12/06/2016              Requested by: Celene Squibb, MD [address] [place], [place] [address] PCP: Celene Squibb, MD  Chief Complaint  Patient presents with  . Right Foot - Follow-up      HPI: Patient is a 69 she'll gentleman who presents in follow-up 4 weeks out from a fracture of the metatarsal neck right foot fifth toe. Patient is full weightbearing in a postoperative shoe. Patient's most recent uric acid was 5.0  Assessment & Plan: Visit Diagnoses:  1. Closed nondisplaced fracture of fifth metatarsal bone of right foot with routine healing, subsequent encounter     Plan: Recommend continue the postoperative shoe for 3 additional weeks for repeat 3 view radiographs of the right foot at that time at which time I anticipate we can advance him to regular shoewear patient was given a note for estimated return to work September 24  Follow-Up Instructions: Return in about 3 weeks (around 12/27/2016).   Ortho Exam  Patient is alert, oriented, no adenopathy, well-dressed, normal affect, normal respiratory effort. Examination patient has antalgic gait he has a good pulse there is no redness no cellulitis no signs of infection. The fracture site is minimally tender to palpation. He has good ankle and subtalar motion no plantar ulcers.  Imaging: Xr Foot Complete Right  Result Date: 12/06/2016 Three-view radiographs of the right foot shows good callus formation at the fifth metatarsal neck fracture. There is osteophytic spurring dorsally at the base of the second metatarsal medial cuneiform  No images are attached to the encounter.  Labs: Lab Results  Component Value Date   ESRSEDRATE 2 11/11/2016   LABURIC 5.0 11/11/2016   LABURIC 8.4 (H) 10/29/2014    Orders:  Orders Placed This Encounter  Procedures  . XR Foot Complete Right   No orders of the  defined types were placed in this encounter.    Procedures: No procedures performed  Clinical Data: No additional findings.  ROS:  All other systems negative, except as noted in the HPI. Review of Systems  Objective: Vital Signs: There were no vitals taken for this visit.  Specialty Comments:  No specialty comments available.  PMFS History: Patient Active Problem List   Diagnosis Date Noted  . Bunion, left 06/11/2016  . 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 Hx of adenomatous colonic polyps 03/29/2014  . Chest pain 01/13/2013  . Fever 01/13/2013  . Dehydration 01/13/2013  . Plantar fasciitis of right foot 01/21/2012  . TESTICULAR HYPOFUNCTION 06/13/2009  . FATIGUE 06/04/2009  . HYPERTENSION 06/03/2008  . DEPRESSION 12/26/2006  . ALLERGIC RHINITIS 10/20/2006  . SLEEP APNEA 10/20/2006   Past Medical History:  Diagnosis Date  . Allergy   . Depression   . High cholesterol    borderline  . Hx of adenomatous colonic polyps 03/29/2014  . Hypertension   . Substance abuse     Family History  Problem Relation Age of Onset  . Skin cancer Father   . Hypertension Father   . Breast cancer Mother   . Stomach cancer Mother   . Pancreatic cancer Brother   . Colon cancer Neg Hx   . Esophageal cancer Neg Hx   . Rectal  cancer Neg Hx     Past Surgical History:  Procedure Laterality Date  . CARPAL TUNNEL RELEASE Bilateral   . COLONOSCOPY    . KNEE ARTHROSCOPY  2010  . TONSILLECTOMY  1960  . VASECTOMY  1991   Social History   Occupational History  .  [employer]    [employer]   Social History Main Topics  . Smoking status: Former Smoker    Packs/day: 2.00    Years: 14.00  . Smokeless tobacco: Former Systems developer    Quit date: 12/19/1983  . Alcohol use 12.6 oz/week    21 Cans of beer per week     Comment: 2-3 beers daily  . Drug use: No  . Sexual activity: Yes    Birth control/ protection: None

## 2016-12-27 ENCOUNTER — Ambulatory Visit (INDEPENDENT_AMBULATORY_CARE_PROVIDER_SITE_OTHER): Payer: 59 | Admitting: Orthopedic Surgery

## 2016-12-27 ENCOUNTER — Ambulatory Visit (INDEPENDENT_AMBULATORY_CARE_PROVIDER_SITE_OTHER): Payer: 59

## 2016-12-27 DIAGNOSIS — M79671 Pain in right foot: Secondary | ICD-10-CM

## 2016-12-27 DIAGNOSIS — S92354A Nondisplaced fracture of fifth metatarsal bone, right foot, initial encounter for closed fracture: Secondary | ICD-10-CM

## 2016-12-27 NOTE — Progress Notes (Signed)
Office Visit Note   Patient: Darren Gay           Date of Birth: 1950-05-06           MRN: [id number] Visit Date: 12/27/2016              Requested by: Celene Squibb, MD [address] [place], [place] [address] PCP: Celene Squibb, MD  Chief Complaint  Patient presents with  . Right Foot - Follow-up      HPI: Patient is a 66 year old gentleman presents in follow-up for a closed nondisplaced fracture fifth metatarsal right foot. Patient is currently wearing soft flexible shoes and has no symptoms with ambulation. He is wearing 20-30 compression stockings as well.  Assessment & Plan: Visit Diagnoses:  1. Pain in right foot   2. Nondisplaced fracture of fifth metatarsal bone, right foot, initial encounter for closed fracture     Plan: Recommended using the stiff soled steel toed shoes for most of his activities discussed that he can advance to a trail running shoe for a prolonged activities recommended wearing the medical compression stockings for his venous insufficiency.  Follow-Up Instructions: Return if symptoms worsen or fail to improve.   Ortho Exam  Patient is alert, oriented, no adenopathy, well-dressed, normal affect, normal respiratory effort. Examination patient has a normal gait. He has good pulses the fracture site is nontender to palpation he does have pitting edema from venous insufficiency but no open ulcers.  Imaging: Xr Foot Complete Right  Result Date: 12/27/2016 Three-view radiographs of the right foot shows good callus formation at the fifth metatarsal neck stress fracture.  No images are attached to the encounter.  Labs: Lab Results  Component Value Date   ESRSEDRATE 2 11/11/2016   LABURIC 5.0 11/11/2016   LABURIC 8.4 (H) 10/29/2014    Orders:  Orders Placed This Encounter  Procedures  . XR Foot Complete Right   No orders of the defined types were placed in this encounter.    Procedures: No procedures performed  Clinical Data: No  additional findings.  ROS:  All other systems negative, except as noted in the HPI. Review of Systems  Objective: Vital Signs: There were no vitals taken for this visit.  Specialty Comments:  No specialty comments available.  PMFS History: Patient Active Problem List   Diagnosis Date Noted  . Bunion, left 06/11/2016  . 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 Hx of adenomatous colonic polyps 03/29/2014  . Chest pain 01/13/2013  . Fever 01/13/2013  . Dehydration 01/13/2013  . Plantar fasciitis of right foot 01/21/2012  . TESTICULAR HYPOFUNCTION 06/13/2009  . FATIGUE 06/04/2009  . HYPERTENSION 06/03/2008  . DEPRESSION 12/26/2006  . ALLERGIC RHINITIS 10/20/2006  . SLEEP APNEA 10/20/2006   Past Medical History:  Diagnosis Date  . Allergy   . Depression   . High cholesterol    borderline  . Hx of adenomatous colonic polyps 03/29/2014  . Hypertension   . Substance abuse     Family History  Problem Relation Age of Onset  . Skin cancer Father   . Hypertension Father   . Breast cancer Mother   . Stomach cancer Mother   . Pancreatic cancer Brother   . Colon cancer Neg Hx   . Esophageal cancer Neg Hx   . Rectal cancer Neg Hx     Past Surgical History:  Procedure Laterality Date  . CARPAL TUNNEL RELEASE Bilateral   . COLONOSCOPY    .  KNEE ARTHROSCOPY  2010  . TONSILLECTOMY  1960  . VASECTOMY  1991   Social History   Occupational History  .  [employer]    [employer]   Social History Main Topics  . Smoking status: Former Smoker    Packs/day: 2.00    Years: 14.00  . Smokeless tobacco: Former Systems developer    Quit date: 12/19/1983  . Alcohol use 12.6 oz/week    21 Cans of beer per week     Comment: 2-3 beers daily  . Drug use: No  . Sexual activity: Yes    Birth control/ protection: None

## 2017-03-11 ENCOUNTER — Ambulatory Visit: Payer: 59 | Admitting: Podiatry

## 2017-03-11 ENCOUNTER — Other Ambulatory Visit: Payer: Self-pay | Admitting: Podiatry

## 2017-03-11 ENCOUNTER — Ambulatory Visit (INDEPENDENT_AMBULATORY_CARE_PROVIDER_SITE_OTHER): Payer: 59

## 2017-03-11 ENCOUNTER — Encounter: Payer: Self-pay | Admitting: Podiatry

## 2017-03-11 DIAGNOSIS — M7989 Other specified soft tissue disorders: Secondary | ICD-10-CM | POA: Diagnosis not present

## 2017-03-11 DIAGNOSIS — M2012 Hallux valgus (acquired), left foot: Secondary | ICD-10-CM

## 2017-03-11 DIAGNOSIS — M2011 Hallux valgus (acquired), right foot: Secondary | ICD-10-CM | POA: Diagnosis not present

## 2017-03-11 DIAGNOSIS — M21619 Bunion of unspecified foot: Secondary | ICD-10-CM | POA: Diagnosis not present

## 2017-03-11 DIAGNOSIS — L600 Ingrowing nail: Secondary | ICD-10-CM

## 2017-03-11 NOTE — Patient Instructions (Signed)

## 2017-03-11 NOTE — Progress Notes (Signed)
Subjective:   Patient ID: Darren Gay, male   DOB: 66 y.o.   MRN: [id number]   HPI patient presents stating that he has had a chronic ingrown toenail of his right big toe and has had previous bunion surgery done left that has done okay with pain in his midfoot in his right big toe was also worked on and is not sure as to whether or not that is better.  States the nails been bothering him for several months and he is tried to trim it and soak it without relief of symptoms    Review of Systems  All other systems reviewed and are negative.       Objective:  Physical Exam  Constitutional: He appears well-developed and well-nourished.  Cardiovascular: Intact distal pulses.  Pulmonary/Chest: Effort normal.  Musculoskeletal: Normal range of motion.  Neurological: He is alert.  Skin: Skin is warm.  Nursing note and vitals reviewed.   Neurovascular status intact muscle strength adequate range of motion within normal limits with patient noted to have incurvated lateral border of the right hallux that is painful when pressed with patient noted to have surgical site on the left first metatarsal and on the right hallux with redness around the right hallux localized with no proximal edema erythema or drainage noted.  The left midfoot area is sore when pressed patient is noted to have good perfusion and is well oriented x3.  Patient does not smoke does have gout controlled with allopurinol and likes to be active     Assessment:  Ingrown toenail deformity right hallux lateral border painful when pressed along with history of bunion correction left and hallux correction right with no active drainage but swelling still noted     Plan:  H&P all conditions reviewed.  At this time are to focus on the nail and I infiltrated 60 mg like Marcaine mixture and remove the lateral border exposed matrix and applied phenol 3 applications 30 seconds followed by alcohol application and sterile dressing.  I then  reviewed his x-rays and I recommend compression at night soaks and if symptoms worsen we will need to see him back.  X-rays indicate that there is a structural correction of the left first metatarsal which appears to be just removal of bone spurs and everything else appears to be okay with what appears to be on the right hallux on removal of the head of the proximal phalanx

## 2017-03-11 NOTE — Progress Notes (Signed)
   Subjective:    Patient ID: Darren Gay, male    DOB: Dec 02, 1950, 66 y.o.   MRN: [id number]  HPI    Review of Systems  All other systems reviewed and are negative.      Objective:   Physical Exam        Assessment & Plan:

## 2017-03-25 ENCOUNTER — Telehealth: Payer: Self-pay | Admitting: Podiatry

## 2017-03-25 NOTE — Telephone Encounter (Signed)
Left message for pt to call to discuss orthotic coverage per Dr Paulla Dolly.

## 2017-03-30 ENCOUNTER — Telehealth: Payer: Self-pay | Admitting: Podiatry

## 2017-03-30 NOTE — Telephone Encounter (Signed)
Pt left message stating we were to check orthotic coverage several weeks ago and he has not heard anything. If when I call back ok to leave a message.   I called pt back and left a message for pt that unless he is diabetic insurance said it was an exclussion from his plan. I had left one on 12.14.18 for pt to call me to discuss. I asked him to give me a call to discuss further.

## 2017-04-27 ENCOUNTER — Encounter: Payer: Self-pay | Admitting: Internal Medicine

## 2017-05-18 ENCOUNTER — Encounter: Payer: Self-pay | Admitting: Internal Medicine

## 2017-06-13 ENCOUNTER — Encounter (HOSPITAL_COMMUNITY): Payer: Self-pay | Admitting: Emergency Medicine

## 2017-06-13 ENCOUNTER — Ambulatory Visit (INDEPENDENT_AMBULATORY_CARE_PROVIDER_SITE_OTHER): Payer: 59

## 2017-06-13 ENCOUNTER — Ambulatory Visit (HOSPITAL_COMMUNITY)
Admission: EM | Admit: 2017-06-13 | Discharge: 2017-06-13 | Disposition: A | Discharge status: Home / Self Care | Payer: 59 | Attending: Emergency Medicine | Admitting: Emergency Medicine

## 2017-06-13 ENCOUNTER — Ambulatory Visit: Payer: 59 | Admitting: Podiatry

## 2017-06-13 DIAGNOSIS — M79672 Pain in left foot: Secondary | ICD-10-CM

## 2017-06-13 DIAGNOSIS — M25511 Pain in right shoulder: Secondary | ICD-10-CM

## 2017-06-13 HISTORY — DX: Gout, unspecified: M10.9

## 2017-06-13 MED ORDER — PREDNISONE 50 MG PO TABS: 50.0000 mg | ORAL_TABLET | Freq: Every day | ORAL | 0 refills | Status: AC

## 2017-06-13 MED ORDER — NAPROXEN 500 MG PO TABS: 500.0000 mg | ORAL_TABLET | Freq: Two times a day (BID) | ORAL | 0 refills | Status: DC

## 2017-06-13 NOTE — ED Provider Notes (Signed)
[place]    CSN: [id number] Arrival date & time: 06/13/17  1017     History   Chief Complaint Chief Complaint  Patient presents with  . Shoulder Pain  . Leg Swelling    HPI Darren Gay is a 67 y.o. male.    Darren Gay presents with complaints of left foot pain and swelling after he stepped on it wrong 2/28 while at work and heard a pop. States pain is worse with weight bearing and with ambulation. Improves with walking if he walks flat footed without any roll of his weight on his foot. Has been applying ice and taking advil/aleve which have only minimally helped. Took advil last last night. Has had bunion surgery to left foot in the past but otherwise denies any previous foot or ankle surgery. History of gout but has not had in a few years. Takes daily allopurinol. Pain has not worsened but has not improved. Without diabetes. Also complains of bilateral shoulder pain, right>left. He states it started approximately 1 month ago after getting pneumonia and tdap vaccines. Pain is worse with raising arm to shoulder level. Pain at night as well. Without any injury to shoulder. Without numbness or tingling to arm or fingers. Was given NSAIDs to use PRN for this from his PCP but he states pain has persisted. Hx gout, htn, high cholesterol, depression, allergies, plantar fasciitis.    ROS per HPI.       Past Medical History:  Diagnosis Date  . Allergy   . Depression   . Gout   . High cholesterol    borderline  . Hx of adenomatous colonic polyps 03/29/2014  . Hypertension   . Substance abuse [facility])     Patient Active Problem List   Diagnosis Date Noted  . Bunion, left 06/11/2016  . 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 Hx of adenomatous colonic polyps 03/29/2014  . Chest pain 01/13/2013  . Fever 01/13/2013  . Dehydration 01/13/2013  . Plantar fasciitis of right foot 01/21/2012  . TESTICULAR HYPOFUNCTION 06/13/2009  . FATIGUE 06/04/2009    . HYPERTENSION 06/03/2008  . DEPRESSION 12/26/2006  . ALLERGIC RHINITIS 10/20/2006  . SLEEP APNEA 10/20/2006    Past Surgical History:  Procedure Laterality Date  . CARPAL TUNNEL RELEASE Bilateral   . COLONOSCOPY    . KNEE ARTHROSCOPY  2010  . TONSILLECTOMY  1960  . VASECTOMY  1991       Home Medications    Prior to Admission medications   Medication Sig Start Date End Date Taking? Authorizing Provider  allopurinol (ZYLOPRIM) 100 MG tablet Take 100 mg by mouth 2 (two) times daily.    [provider]  ANDROGEL PUMP 20.25 MG/ACT (1.62%) GEL Apply 1 application topically daily. One pump applied to either shoulder once daily 07/20/12   [provider]  aspirin 81 MG tablet Take 81 mg by mouth daily.    [provider]  Buprenorphine HCl-Naloxone HCl 4.2-0.7 MG FILM Place 1 Film inside cheek daily.    [provider]  buPROPion (WELLBUTRIN SR) 150 MG 12 hr tablet Take 150 mg by mouth 2 (two) times daily.    [provider]  Cholecalciferol (VITAMIN D-3) 5000 units TABS Take 5,000 Units by mouth daily.    [provider]  CIALIS 20 MG tablet Take 20 mg by mouth daily as needed for erectile dysfunction.  06/04/12   [provider]  EXFORGE HCT 52-841-32 MG TABS  01/23/13   [provider]  fexofenadine (ALLEGRA) 180 MG tablet Take 180 mg by mouth as needed for allergies or rhinitis.    [provider]  Flax Oil-Fish Oil-Borage Oil (FISH OIL-FLAX OIL-BORAGE OIL) CAPS Take 1 capsule by mouth daily.    [provider]  fluticasone (FLONASE) 50 MCG/ACT nasal spray Place 1 spray into both nostrils daily.    [provider]  modafinil (PROVIGIL) 100 MG tablet Take 100 mg by mouth 2 (two) times daily.    [provider]  Multiple Vitamins-Minerals (MULTIVITAMIN PO) Take 1 tablet by mouth daily.    [provider]  naproxen (NAPROSYN) 500 MG tablet Take 1 tablet (500 mg total) by  mouth 2 (two) times daily. 06/13/17   Augusto Gamble B, NP  Naproxen Sodium (ALEVE PO) Take 1 tablet by mouth as needed.    [provider]  Omega-3 Fatty Acids (FISH OIL PO) Take 1 capsule by mouth daily.    [provider]  predniSONE (DELTASONE) 50 MG tablet Take 1 tablet (50 mg total) by mouth daily with breakfast for 5 days. 06/13/17 06/18/17  Zigmund Gottron, NP  Probiotic Product (PROBIOTIC DAILY PO) Take 1 tablet by mouth daily.    [provider]  vitamin E (VITAMIN E) 400 UNIT capsule Take 400 Units by mouth daily.    [provider]    Family History Family History  Problem Relation Age of Onset  . Skin cancer Father   . Hypertension Father   . Breast cancer Mother   . Stomach cancer Mother   . Pancreatic cancer Brother   . Colon cancer Neg Hx   . Esophageal cancer Neg Hx   . Rectal cancer Neg Hx     Social History Social History   Tobacco Use  . Smoking status: Former Smoker    Packs/day: 2.00    Years: 14.00    Pack years: 28.00  . Smokeless tobacco: Former Systems developer    Quit date: 12/19/1983  Substance Use Topics  . Alcohol use: Yes    Alcohol/week: 12.6 oz    Types: 21 Cans of beer per week    Comment: 2-3 beers daily  . Drug use: No     Allergies   Colchicine   Review of Systems Review of Systems   Physical Exam Triage Vital Signs ED Triage Vitals [06/13/17 1141]  Enc Vitals Group     BP 128/79     Pulse Rate 78     Resp 18     Temp 98.2 F (36.8 C)     Temp Source Oral     SpO2 100 %     Weight      Height      Head Circumference      Peak Flow      Pain Score      Pain Loc      Pain Edu?      Excl. in [place]?    No data found.  Updated Vital Signs BP 128/79 (BP Location: Right Arm)   Pulse 78   Temp 98.2 F (36.8 C) (Oral)   Resp 18   SpO2 100%   Visual Acuity Right Eye Distance:   Left Eye Distance:   Bilateral Distance:    Right Eye Near:   Left Eye Near:    Bilateral Near:     Physical Exam    Constitutional: He is oriented to person, place, and time. He appears well-developed and  well-nourished.  Cardiovascular: Normal rate and regular rhythm.  Pulmonary/Chest: Effort normal and breath sounds normal.  Musculoskeletal:       Right shoulder: He exhibits decreased range of motion and tenderness. He exhibits no bony tenderness, no swelling, no effusion, no crepitus, no deformity, no laceration, normal pulse and normal strength.       Right ankle: He exhibits swelling. He exhibits normal range of motion, no ecchymosis, no deformity, no laceration and normal pulse. No tenderness.       Arms:      Left foot: There is tenderness, bony tenderness and swelling. There is normal range of motion, normal capillary refill, no crepitus, no deformity and no laceration.       Feet:  Generalized swelling and tenderness to dorsal mid foot with swelling extending up into ankles; full active ROM to toes and ankle; sensation intact; mild redness noted to foot; right shoulder with tenderness at proximal bicep/proximal humerus; complete arc ROM but pain with abduction once at level of shoulder, pain with external rotation with arm behind back; sensation intact and strong radial pulses; full ROM to elbow and wrist  Neurological: He is alert and oriented to person, place, and time.  Skin: Skin is warm and dry.     UC Treatments / Results  Labs (all labs ordered are listed, but only abnormal results are displayed) Labs Reviewed - No data to display  EKG  EKG Interpretation None       Radiology Dg Shoulder Right  Result Date: 06/13/2017 CLINICAL DATA:  Right shoulder pain starting 1 month ago. EXAM: RIGHT SHOULDER - 2+ VIEW COMPARISON:  None. FINDINGS: There is no evidence of fracture or dislocation. There is no evidence of arthropathy or other focal bone abnormality. Soft tissues are unremarkable. IMPRESSION: Negative. Electronically Signed   By: Monte Fantasia M.D.   On: 06/13/2017 13:11   Dg  Foot Complete Left  Result Date: 06/13/2017 CLINICAL DATA:  Increasing forefoot pain with swelling and limited range of motion for 1 week. Worsening pain with ambulation. No acute injury. EXAM: LEFT FOOT - COMPLETE 3+ VIEW COMPARISON:  Radiographs 10/19/2016, 08/06/2015 and 03/17/2015. FINDINGS: The bones appear mildly demineralized. There is new flattening and fragmentation of the 2nd metatarsal head consistent with osteochondrosis (Freiberg disease). There is progressive thick periosteal bone formation along the lateral and plantar shaft the 1st metatarsal since 10/19/2016 (new from 08/06/2015), likely postsurgical. Underlying degenerative changes at the 1st MTP joint and Lisfranc joint have mildly progressed. There is soft tissue swelling dorsally in the midfoot, extending into the anterior lower leg. Prominent vascular calcifications are present. IMPRESSION: 1. New flattening and fragmentation of the 2nd metatarsal head consistent with Freiberg disease. 2. Progressive periosteal bone formation around the 1st metatarsal, likely postsurgical. Mildly progressive degenerative changes. Electronically Signed   By: Richardean Sale M.D.   On: 06/13/2017 13:18    Procedures Procedures (including critical care time)  Medications Ordered in UC Medications - No data to display   Initial Impression / Assessment and Plan / UC Course  I have reviewed the triage vital signs and the nursing notes.  Pertinent labs & imaging results that were available during my care of the patient were reviewed by me and considered in my medical decision making (see chart for details).     Shoulder without acute findings at this time. ROM exercises provided, ice, application, NSAIDS. Left foot with swelling and tenderness, imaging consistent with Freiberg disease although swelling appears much more superior than  location affected. History of gout as we.. 5 days of prednisone, Aleve, ace wrap, walking shoe provided. Patient  agreeable to close follow up with Dr. Paulla Dolly for recheck and further evaluation. Patient verbalized understanding and agreeable to plan.  Ambulatory out of clinic without difficulty.    Final Clinical Impressions(s) / UC Diagnoses   Final diagnoses:  Foot pain, left  Right shoulder pain, unspecified chronicity    ED Discharge Orders        Ordered    predniSONE (DELTASONE) 50 MG tablet  Daily with breakfast     06/13/17 1333    naproxen (NAPROSYN) 500 MG tablet  2 times daily     06/13/17 1333       Controlled Substance Prescriptions [place] Controlled Substance Registry consulted? Not Applicable   Zigmund Gottron, NP 06/13/17 1348

## 2017-06-13 NOTE — Discharge Instructions (Addendum)
There is a finding to your second toe which should be followed up with your orthopedist and/or your podiatrist. I am not convinced that this is what is causing your foot swelling however. Please take 5 days of prednisone as well as 10 days of naproxen 500mg , take with food.  Please wear the solid soled shoe until otherwise told so by orthopedics. Please see range of motion exercises for right shoulder. Continue to follow with orthopedics for further evaluation as further imaging may be appropriate if pain persists.

## 2017-06-13 NOTE — ED Notes (Signed)
Pt refused post-op shoe.  States he has several different types of shoes at home like this.  I applied one ace wrap to the foot, demonstrating how to put the ace wrap on.  Pt stated understanding then took the ace wrap off.  I supplied him with an extra ace wrap to be able to wrap further up the leg if this was comfortable for him.  Pt walked out of facility with flip-flops on.

## 2017-06-13 NOTE — ED Triage Notes (Signed)
Pt here for right shoulder pain x 1 month; lump on left shoulder and swelling to left foot and ankle after injury

## 2017-06-14 ENCOUNTER — Ambulatory Visit: Payer: 59 | Admitting: Podiatry

## 2017-06-14 DIAGNOSIS — M7989 Other specified soft tissue disorders: Secondary | ICD-10-CM | POA: Diagnosis not present

## 2017-06-14 DIAGNOSIS — M9272 Juvenile osteochondrosis of metatarsus, left foot: Secondary | ICD-10-CM

## 2017-06-14 DIAGNOSIS — T148XXA Other injury of unspecified body region, initial encounter: Secondary | ICD-10-CM | POA: Diagnosis not present

## 2017-06-15 ENCOUNTER — Telehealth: Payer: Self-pay | Admitting: *Deleted

## 2017-06-15 DIAGNOSIS — T148XXA Other injury of unspecified body region, initial encounter: Secondary | ICD-10-CM

## 2017-06-15 NOTE — Telephone Encounter (Signed)
Orders to Gretta Arab, RN for Pre-cert, faxed to [facility] imaging.

## 2017-06-15 NOTE — Telephone Encounter (Signed)
-----   Message from Trula Slade, DPM sent at 06/14/2017  4:30 PM EST ----- Can you please order an MRI of the left foot and ankle to look at [facility] and rule out tendon tear? Thanks.

## 2017-06-17 ENCOUNTER — Ambulatory Visit (INDEPENDENT_AMBULATORY_CARE_PROVIDER_SITE_OTHER): Payer: 59 | Admitting: Orthopedic Surgery

## 2017-06-17 NOTE — Progress Notes (Signed)
Subjective: 67 year old male presents the office today for concerns of pain and swelling to his left foot and ankle which is been ongoing the last couple of days.  He went to urgent care yesterday and had x-rays performed and he was told he had Freiberg's infarction of the second metatarsal.  He states that last week he was walking he felt a pop in his foot.  Since then he has been having pain and swelling to the foot.  Yesterday in the urgent care they did prescribe prednisone as well as naproxen.  He states that he felt a pop when he was walking a step back and his foot was caught in a rope. Denies any systemic complaints such as fevers, chills, nausea, vomiting. No acute changes since last appointment, and no other complaints at this time.   Objective: AAO x3, NAD DP/PT pulses palpable bilaterally, CRT less than 3 seconds There is moderate edema of the left foot and ankle.  There is decreased manual muscle testing dorsiflexion of the ankle.  He feels that this is more from swelling.  There is no area of tenderness to the ankle.  Achilles tendon appears to be intact as well as the peroneal tendons, flexor tendons.  There is tenderness diffusely to the foot mostly on the second metatarsal on the base and to the head.  No other areas of tenderness identified. No open lesions or pre-ulcerative lesions.  No pain with calf compression, swelling, warmth, erythema  Assessment: Left foot Freiberg's infarction, rule out extensor tendon tear ankle  Plan: -All treatment options discussed with the patient including all alternatives, risks, complications.  -Reviewed the x-rays with the patient.  Also compared to previous x-rays.  There is changes of the second metatarsal.  I do think he has a true Freiberg's infarction however also concerned given the lack of motion and dorsiflexion of the ankle.  Because of this I have ordered an MRI of the foot and ankle to evaluate the second metatarsal as well as to rule out  a tendon tear to the ankle and this is for potential surgical planning.  For now we will immobilized in a walking boot which was dispensed today.  Try to limit activity.  Ice and elevation. -Patient encouraged to call the office with any questions, concerns, change in symptoms.   Trula Slade DPM

## 2017-06-19 ENCOUNTER — Ambulatory Visit
Admission: RE | Admit: 2017-06-19 | Discharge: 2017-06-19 | Disposition: A | Discharge status: Home / Self Care | Payer: 59 | Source: Ambulatory Visit | Attending: Podiatry | Admitting: Podiatry

## 2017-06-20 ENCOUNTER — Telehealth: Payer: Self-pay | Admitting: *Deleted

## 2017-06-20 NOTE — Telephone Encounter (Signed)
I called and spoke with him. He has a rupture of his anterior tibial tendon and he is going to come in to discuss surgery. Therefore he is going to be out of work.

## 2017-06-20 NOTE — Telephone Encounter (Signed)
Pt states he was seen by Dr. Jacqualyn Posey and has Frieberg's Infarction, and had MRIs over the weekend, but is unable to schedule an appt with Dr. Jacqualyn Posey until next Tuesday. Pt asked if Dr. Jacqualyn Posey wanted him to be out until evaluated next week, because he is concerned about his FMLA. Pt states he was told to stay in the boot, and his job requires him to wear steel-toed boots.

## 2017-06-20 NOTE — Telephone Encounter (Signed)
Mardene Celeste [facility] states pt does not have orders in Epic. I told Mardene Celeste, that was unusual, I put in Dr. Leigh Aurora orders once given. I reviewed the Chart Review Imaging and there ordered MRIs had been resulted, and I informed Mardene Celeste. Mardene Celeste states never mind.

## 2017-06-21 NOTE — Telephone Encounter (Signed)
I spoke with pt and asked what dates he needed for his work note and pt states his employer wants the information of the FMLA forms. I told pt I would coordinated with surgery coordinator and the FMLA/Disability coordinator to get a date for the absence note, and to ask for me and the FMLA Coordinator at his visit tomorrow. Pt states understanding.

## 2017-06-22 ENCOUNTER — Ambulatory Visit (INDEPENDENT_AMBULATORY_CARE_PROVIDER_SITE_OTHER): Payer: 59 | Admitting: Orthopedic Surgery

## 2017-06-22 ENCOUNTER — Ambulatory Visit: Payer: 59 | Admitting: Podiatry

## 2017-06-22 ENCOUNTER — Encounter: Payer: Self-pay | Admitting: Podiatry

## 2017-06-22 DIAGNOSIS — T148XXA Other injury of unspecified body region, initial encounter: Secondary | ICD-10-CM

## 2017-06-22 DIAGNOSIS — M9272 Juvenile osteochondrosis of metatarsus, left foot: Secondary | ICD-10-CM

## 2017-06-22 NOTE — Patient Instructions (Signed)
Pre-Operative Instructions  Congratulations, you have decided to take an important step towards improving your quality of life.  You can be assured that the doctors and staff at [facility] will be with you every step of the way.  Here are some important things you should know:  1. Plan to be at the surgery center/hospital at least 1 (one) hour prior to your scheduled time, unless otherwise directed by the surgical center/hospital staff.  You must have a responsible adult accompany you, remain during the surgery and drive you home.  Make sure you have directions to the surgical center/hospital to ensure you arrive on time. 2. If you are having surgery at [facility] or [facility] hospitals, you will need a copy of your medical history and physical form from your family physician within one month prior to the date of surgery. We will give you a form for your primary physician to complete.  3. We make every effort to accommodate the date you request for surgery.  However, there are times where surgery dates or times have to be moved.  We will contact you as soon as possible if a change in schedule is required.   4. No aspirin/ibuprofen for one week before surgery.  If you are on aspirin, any non-steroidal anti-inflammatory medications (Mobic, Aleve, Ibuprofen) should not be taken seven (7) days prior to your surgery.  You make take Tylenol for pain prior to surgery.  5. Medications - If you are taking daily heart and blood pressure medications, seizure, reflux, allergy, asthma, anxiety, pain or diabetes medications, make sure you notify the surgery center/hospital before the day of surgery so they can tell you which medications you should take or avoid the day of surgery. 6. No food or drink after midnight the night before surgery unless directed otherwise by surgical center/hospital staff. 7. No alcoholic beverages 24-hours prior to surgery.  No smoking 24-hours prior or 24-hours after  surgery. 8. Wear loose pants or shorts. They should be loose enough to fit over bandages, boots, and casts. 9. Don't wear slip-on shoes. Sneakers are preferred. 10. Bring your boot with you to the surgery center/hospital.  Also bring crutches or a walker if your physician has prescribed it for you.  If you do not have this equipment, it will be provided for you after surgery. 11. If you have not been contacted by the surgery center/hospital by the day before your surgery, call to confirm the date and time of your surgery. 12. Leave-time from work may vary depending on the type of surgery you have.  Appropriate arrangements should be made prior to surgery with your employer. 13. Prescriptions will be provided immediately following surgery by your doctor.  Fill these as soon as possible after surgery and take the medication as directed. Pain medications will not be refilled on weekends and must be approved by the doctor. 14. Remove nail polish on the operative foot and avoid getting pedicures prior to surgery. 15. Wash the night before surgery.  The night before surgery wash the foot and leg well with water and the antibacterial soap provided. Be sure to pay special attention to beneath the toenails and in between the toes.  Wash for at least three (3) minutes. Rinse thoroughly with water and dry well with a towel.  Perform this wash unless told not to do so by your physician.  Enclosed: 1 Ice pack (please put in freezer the night before surgery)   1 Hibiclens skin cleaner     Pre-op instructions  If you have any questions regarding the instructions, please do not hesitate to call our office.  [place]: [address], [place], [place] [address] -- [phone]  [place]: [address] [place], [place] [address] -- [phone]  [place]: [address]  [place], [place] [address] -- [phone]  [place]: [address], [address], [place], [place] [address] -- [phone]  Website:  [email or web address] 

## 2017-06-24 NOTE — Progress Notes (Signed)
Subjective: Darren Gay presents the office today to discuss MRI results.  He recently had an injury where he tripped and he felt an immediate pop to his left foot and has had swelling since then.  He went to the emergency room he was told he had Freiberg's infarction to the second metatarsal.  He also gets quite a bit of pain to the area still.  He has been in the cam boot he has been walking in the boot.  He has had no recent injury otherwise.  He has pain mostly to the front of his ankle he states that he walks and he slapped his foot when he walks.  He has pain when he tries to move the second toe joint and also the somewhat to the third. Denies any systemic complaints such as fevers, chills, nausea, vomiting. No acute changes since last appointment, and no other complaints at this time.   Objective: AAO x3, NAD DP/PT pulses palpable bilaterally, CRT less than 3 seconds Plantarflexion of the ankle intact, dorsiflexion decreased.  There is a palpable defect noted within the tibialis anterior tendon.  There is decrease edema to the ankle today compared to last appointment there is no erythema.  No open lesions.  There is pain and restriction of second MTPJ range of motion.  Also mild discomfort of the first MTPJ.  There is no other area of pinpoint tenderness. No open lesions or pre-ulcerative lesions.  No pain with calf compression, swelling, warmth, erythema  MRI left foot: IMPRESSION: Avascular necrosis of the heads of the second and third metatarsals is much worse in the second. Marrow edema in the distal second metatarsal and proximal phalanx of the second toe are consistent with secondary stress change is noted.  Complete tear of the tibialis anterior as seen on dedicated ankle MRI this same day.  Marked midfoot osteoarthritis is severe at the second and third tarsometatarsal joints.  Status post hallux valgus repair. Osteotomy defect is healed. Moderate to moderately severe first MTP  osteoarthritis is present.  MRI left ankle: IMPRESSION: Complete tibialis anterior tear with retraction to the level of navicular bone.  Severe midfoot osteoarthritis at the second and third tarsometatarsal joints.  Subcutaneous edema about the ankle.  *I did speak with the radiologist.  It appears that the tendon ruptures at the insertion.  There may be some of the medial fibers still intact however.  Is retracted to the level of the talonavicular joint.  Assessment: Tibialis anterior tendon rupture left ankle, Freiberg's second metatarsal head and possible third  Plan: -All treatment options discussed with the patient including all alternatives, risks, complications.  -MRI results were discussed with him.  Discussed both conservative as well as surgical treatment.  This time discussed with him tibialis anterior tendon repair as well as possible second MTPJ arthroplasty with microfracture versus implant.  Also discussed that we do the implant third metatarsal osteotomy.  We discussed the surgery as well as the postoperative course.  At this point after discussion regards to options and wished to proceed with surgery. -The incision placement as well as the postoperative course was discussed with the patient. I discussed risks of the surgery which include, but not limited to, infection, bleeding, pain, swelling, need for further surgery, delayed or nonhealing, painful or ugly scar, numbness or sensation changes, over/under correction, recurrence, transfer lesions, further deformity, hardware failure, DVT/PE, loss of toe/foot. Patient understands these risks and wishes to proceed with surgery. The surgical consent was reviewed with the patient  all 3 pages were signed. No promises or guarantees were given to the outcome of the procedure. All questions were answered to the best of my ability. Before the surgery the patient was encouraged to call the office if there is any further questions. The  surgery will be performed at the [facility] on an outpatient basis. -Remain in cam boot and nonweightbearing for now. -Patient encouraged to call the office with any questions, concerns, change in symptoms.   Trula Slade DPM

## 2017-06-28 ENCOUNTER — Ambulatory Visit: Payer: Self-pay | Admitting: Podiatry

## 2017-06-29 ENCOUNTER — Encounter: Payer: Self-pay | Admitting: Podiatry

## 2017-06-29 ENCOUNTER — Telehealth (INDEPENDENT_AMBULATORY_CARE_PROVIDER_SITE_OTHER): Payer: Self-pay

## 2017-06-29 DIAGNOSIS — T148XXA Other injury of unspecified body region, initial encounter: Secondary | ICD-10-CM | POA: Diagnosis not present

## 2017-06-29 DIAGNOSIS — M2012 Hallux valgus (acquired), left foot: Secondary | ICD-10-CM | POA: Diagnosis not present

## 2017-06-29 DIAGNOSIS — M21542 Acquired clubfoot, left foot: Secondary | ICD-10-CM | POA: Diagnosis not present

## 2017-06-29 NOTE — Telephone Encounter (Signed)
Error

## 2017-06-30 ENCOUNTER — Telehealth: Payer: Self-pay | Admitting: *Deleted

## 2017-06-30 DIAGNOSIS — T148XXA Other injury of unspecified body region, initial encounter: Secondary | ICD-10-CM

## 2017-06-30 NOTE — Telephone Encounter (Signed)
faxed orders to [facility].

## 2017-06-30 NOTE — Telephone Encounter (Signed)
-----   Message from Trula Slade, DPM sent at 06/30/2017  8:04 AM EDT ----- Can you please order him a knee scooter? Thanks.

## 2017-07-01 ENCOUNTER — Ambulatory Visit (INDEPENDENT_AMBULATORY_CARE_PROVIDER_SITE_OTHER): Payer: 59 | Admitting: Orthopaedic Surgery

## 2017-07-01 ENCOUNTER — Telehealth: Payer: Self-pay | Admitting: *Deleted

## 2017-07-01 ENCOUNTER — Encounter (INDEPENDENT_AMBULATORY_CARE_PROVIDER_SITE_OTHER): Payer: Self-pay | Admitting: Orthopaedic Surgery

## 2017-07-01 DIAGNOSIS — M25511 Pain in right shoulder: Secondary | ICD-10-CM

## 2017-07-01 MED ORDER — LIDOCAINE HCL 2 % IJ SOLN
2.0000 mL | INTRAMUSCULAR | Status: AC | PRN
  Administered 2017-07-01: 2 mL

## 2017-07-01 MED ORDER — METHYLPREDNISOLONE ACETATE 40 MG/ML IJ SUSP
40.0000 mg | INTRAMUSCULAR | Status: AC | PRN
  Administered 2017-07-01: 40 mg via INTRA_ARTICULAR

## 2017-07-01 MED ORDER — BUPIVACAINE HCL 0.25 % IJ SOLN
2.0000 mL | INTRAMUSCULAR | Status: AC | PRN
  Administered 2017-07-01: 2 mL via INTRA_ARTICULAR

## 2017-07-01 NOTE — Progress Notes (Signed)
Office Visit Note   Patient: Darren Gay           Date of Birth: [birth year]           MRN: [id number] Visit Date: 07/01/2017              Requested by: Celene Squibb, MD [place], [place] [address] PCP: Celene Squibb, MD   Assessment & Plan: Visit Diagnoses:  1. Acute pain of right shoulder     Plan: Impression is right shoulder rotator cuff tendinitis and probable proximal long head of biceps rupture.  Today, we will inject the right subacromial space with cortisone.  We are hopeful this will settle things down.  He will follow-up with [place] on an as-needed basis.  Call with concerns or questions in the meantime.   Total face to face encounter time was greater than 25 minutes and over half of this time was spent in counseling and/or coordination of care.  Follow-Up Instructions: Return if symptoms worsen or fail to improve.   Orders:  Orders Placed This Encounter  Procedures  . Large Joint Inj: R subacromial bursa   No orders of the defined types were placed in this encounter.     Procedures: Large Joint Inj: R subacromial bursa on 07/01/2017 9:47 AM Indications: pain Details: 22 G needle Medications: 2 mL lidocaine 2 %; 2 mL bupivacaine 0.25 %; 40 mg methylPREDNISolone acetate 40 MG/ML Outcome: tolerated well, no immediate complications Patient was prepped and draped in the usual sterile fashion.       Clinical Data: No additional findings.   Subjective: No chief complaint on file.   HPI patient is a pleasant 67 year old gentleman who presents to our clinic today with right shoulder pain.  This began approximately 2 months ago after having a vaccine in his shoulder.  Since then he has had a knife pick sensation to the proximal humerus.  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  The past week, he has noticed bruising and swelling to the right distal  biceps.  No new injury.  Of note, he is on 81 mg of aspirin daily.  Pain is worse with abduction of the shoulder.  He feels as though he is losing strength as well.  No numbness tingling burning.  Review of Systems as detailed in HPI.  All others are negative.   Objective: Vital Signs: There were no vitals taken for this visit.  Physical Exam well-developed well-nourished gentleman in no acute distress.  Alert and oriented x3.  Ortho Exam examination of the right shoulder reveals full forward flexion.  He can internally rotate to L5.  Positive empty can, positive O'Brien's and positive speeds test.  Negative flip test.  popeye deformity. Full supination and flexion at the elbow.  He is neurovascular intact distally.  Specialty Comments:  No specialty comments available.  Imaging: No new imaging.   PMFS History: Patient Active Problem List   Diagnosis Date Noted  . Acute pain of right shoulder 07/01/2017  . Bunion, left 06/11/2016  . 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 Hx of adenomatous colonic polyps 03/29/2014  . Chest pain 01/13/2013  . Fever 01/13/2013  . Dehydration 01/13/2013  . Plantar fasciitis of right foot 01/21/2012  . TESTICULAR HYPOFUNCTION 06/13/2009  . FATIGUE 06/04/2009  . HYPERTENSION  06/03/2008  . DEPRESSION 12/26/2006  . ALLERGIC RHINITIS 10/20/2006  . SLEEP APNEA 10/20/2006   Past Medical History:  Diagnosis Date  . Allergy   . Depression   . Gout   . High cholesterol    borderline  . Hx of adenomatous colonic polyps 03/29/2014  . Hypertension   . Substance abuse ([place])     Family History  Problem Relation Age of Onset  . Skin cancer Father   . Hypertension Father   . Breast cancer Mother   . Stomach cancer Mother   . Pancreatic cancer Brother   . Colon cancer Neg Hx   . Esophageal cancer Neg Hx   . Rectal cancer Neg Hx     Past Surgical History:  Procedure Laterality Date  . CARPAL TUNNEL RELEASE Bilateral     . COLONOSCOPY    . KNEE ARTHROSCOPY  2010  . TONSILLECTOMY  1960  . VASECTOMY  1991   Social History   Occupational History    Employer: [employer]    Comment: [employer]  Tobacco Use  . Smoking status: Former Smoker    Packs/day: 2.00    Years: 14.00    Pack years: 28.00  . Smokeless tobacco: Former Systems developer    Quit date: 12/19/1983  Substance and Sexual Activity  . Alcohol use: Yes    Alcohol/week: 12.6 oz    Types: 21 Cans of beer per week    Comment: 2-3 beers daily  . Drug use: No  . Sexual activity: Yes    Birth control/protection: None

## 2017-07-01 NOTE — Telephone Encounter (Signed)
Called and spoke with the patient and patient stated that he stumbled going up 4 steps yesterday and his wife was behind him other than that he is doing good and there is no fever or chills and no nausea and patient stated that he was in a soft splint and I stated to elevate and ice and to call the office if any concerns or questions. Lattie Haw

## 2017-07-04 ENCOUNTER — Encounter: Payer: Self-pay | Admitting: Podiatry

## 2017-07-04 ENCOUNTER — Ambulatory Visit (INDEPENDENT_AMBULATORY_CARE_PROVIDER_SITE_OTHER): Payer: 59

## 2017-07-04 ENCOUNTER — Ambulatory Visit (INDEPENDENT_AMBULATORY_CARE_PROVIDER_SITE_OTHER): Payer: 59 | Admitting: Podiatry

## 2017-07-04 DIAGNOSIS — T148XXA Other injury of unspecified body region, initial encounter: Secondary | ICD-10-CM

## 2017-07-04 DIAGNOSIS — M9272 Juvenile osteochondrosis of metatarsus, left foot: Secondary | ICD-10-CM

## 2017-07-04 DIAGNOSIS — Z9889 Other specified postprocedural states: Secondary | ICD-10-CM

## 2017-07-06 NOTE — Progress Notes (Signed)
Subjective: Darren Gay is a 67 y.o. is seen today in office s/p left tibialis anterior tendon repair, 2nd metatarsal head resection, 3rd metatarsal osteotomy preformed on 06/29/2017.  He states that his pain is improving.  He had one incident when he did fall any different some weight on his foot.  Denies any increase in pain but has been no other injury.  Remain nonweightbearing which is a candidate of the posterior splint.. Denies any systemic complaints such as fevers, chills, nausea, vomiting. No calf pain, chest pain, shortness of breath.   Objective: General: No acute distress, AAOx3  DP/PT pulses palpable 2/4, CRT < 3 sec to all digits.  Protective sensation intact. Motor function intact.  LEFT foot: Incision is well coapted without any evidence of dehiscence and sutures and staples are intact. There is no surrounding erythema, ascending cellulitis, fluctuance, crepitus, malodor, drainage/purulence. There is mild edema around the surgical site. There is mild pain along the surgical site.  Toes are in rectus position the tendon appears to be intact. No other areas of tenderness to bilateral lower extremities.  No other open lesions or pre-ulcerative lesions.  No pain with calf compression, swelling, warmth, erythema.   Assessment and Plan:  Status post left foot surgery, doing well with no complications   -Treatment options discussed including all alternatives, risks, and complications -X-rays were obtained and reviewed.  No evidence of acute fracture or stress fracture.  Hardware intact from surgery. -Antibiotic ointment was applied followed by dry sterile dressing.  Leg was padded and the posterior splint was reapplied.  Continue to keep this area clean, dry, intact. -Continue nonweightbearing at all times -Ice/elevation -Pain medication as needed. -Monitor for any clinical signs or symptoms of infection and DVT/PE and directed to call the office immediately should any occur or go to  the ER. -Follow-up 1 week or sooner if any problems arise. In the meantime, encouraged to call the office with any questions, concerns, change in symptoms.   Celesta Gentile, DPM

## 2017-07-11 ENCOUNTER — Encounter: Payer: 59 | Admitting: Podiatry

## 2017-07-11 ENCOUNTER — Encounter: Payer: 59 | Admitting: Internal Medicine

## 2017-07-12 ENCOUNTER — Encounter: Payer: Self-pay | Admitting: Internal Medicine

## 2017-07-14 ENCOUNTER — Ambulatory Visit (INDEPENDENT_AMBULATORY_CARE_PROVIDER_SITE_OTHER): Payer: 59 | Admitting: Podiatry

## 2017-07-14 DIAGNOSIS — T148XXA Other injury of unspecified body region, initial encounter: Secondary | ICD-10-CM

## 2017-07-14 DIAGNOSIS — Z9889 Other specified postprocedural states: Secondary | ICD-10-CM

## 2017-07-14 DIAGNOSIS — M9272 Juvenile osteochondrosis of metatarsus, left foot: Secondary | ICD-10-CM

## 2017-07-19 NOTE — Progress Notes (Signed)
Subjective: Darren Gay is a 67 y.o. is seen today in office s/p left tibialis anterior tendon repair, 2nd metatarsal head resection, 3rd metatarsal osteotomy preformed on 06/29/2017.  He states that he is doing well and he has no pain he feels that there is some stiffness.  He has been nonweightbearing is trying not to put any weight on his foot that he states.  No recent falls or injury.  He has no new concerns today.  He presents today for possible suture removal.  Denies any systemic complaints such as fevers, chills, nausea, vomiting. No calf pain, chest pain, shortness of breath.   Objective: General: No acute distress, AAOx3  DP/PT pulses palpable 2/4, CRT < 3 sec to all digits.  Protective sensation intact. Motor function intact.  LEFT foot: Incision is well coapted without any evidence of dehiscence and sutures and staples are intact. There is no surrounding erythema, ascending cellulitis, fluctuance, crepitus, malodor, drainage/purulence. There is decreased edema around the surgical site. There is no significant pain along the surgical site.  Toes are in rectus position the tendon appears to be intact. No other areas of tenderness to bilateral lower extremities.  No other open lesions or pre-ulcerative lesions.  No pain with calf compression, swelling, warmth, erythema.   Assessment and Plan:  Status post left foot surgery, doing well with no complications   -Treatment options discussed including all alternatives, risks, and complications -Today I did remove a couple the sutures of the digits as well as the sutures along the tendon repair site of the left and staples intact.  Incision remained well coapted. The remainder of the sutures and staples as there was some mild movement across the incisions.  Antibiotic ointment was applied followed by dry sterile dressing.  Keep the dressing clean, dry, intact. -Continue nonweightbearing cam boot at all times. -Ice elevation. -Pain medication  as needed but he has not been eating this. -RTC 1 week or sooner if needed. -Monitor for any clinical signs or symptoms of infection and directed to call the office immediately should any occur or go to the ER.  Trula Slade DPM

## 2017-07-21 ENCOUNTER — Encounter: Payer: Self-pay | Admitting: Internal Medicine

## 2017-07-22 ENCOUNTER — Ambulatory Visit (INDEPENDENT_AMBULATORY_CARE_PROVIDER_SITE_OTHER): Payer: 59 | Admitting: Podiatry

## 2017-07-22 DIAGNOSIS — Z9889 Other specified postprocedural states: Secondary | ICD-10-CM

## 2017-07-22 DIAGNOSIS — M9272 Juvenile osteochondrosis of metatarsus, left foot: Secondary | ICD-10-CM

## 2017-07-22 DIAGNOSIS — T148XXA Other injury of unspecified body region, initial encounter: Secondary | ICD-10-CM

## 2017-07-25 NOTE — Progress Notes (Signed)
Subjective: Darren Gay is a 67 y.o. is seen today in office s/p left tibialis anterior tendon repair, 2nd metatarsal head resection, 3rd metatarsal osteotomy preformed on 06/29/2017.  He presents today for suture, stable removal.  He states that he has been doing well his pain is controlled.  Is been nonweightbearing in the boot.  He can try keep his foot elevated and ice as much as possible.  No recent injury or falls.  He does state that his foot and leg will be swollen by the end of the day but in the morning the swelling is improved and the swelling is intermittent.  Denies any calf pain or redness associated with the swelling.  He has no other concerns today. Denies any systemic complaints such as fevers, chills, nausea, vomiting. No calf pain, chest pain, shortness of breath.   Objective: General: No acute distress, AAOx3  DP/PT pulses palpable 2/4, CRT < 3 sec to all digits.  Protective sensation intact. Motor function intact.  LEFT foot: Incision is well coapted without any evidence of dehiscence and sutures and staples are intact.  Incisions appear to be healing well.  There is no surrounding erythema, ascending cellulitis, fluctuance, crepitus, malodor, drainage/purulence.  Mild swelling to the surgical sites but there is decreased tenderness palpation on surgical sites as well. Mild swelling to the left leg pain to the contralateral extremity but the calf is supple and there is no pain with calf compression, erythema or warmth.  Assessment and Plan:  Status post left foot surgery, doing well with no complications   -Treatment options discussed including all alternatives, risks, and complications -Staples were removed as well as the remaining sutures.  After removal the incisions remained well coapted and doing well.  There is strips are applied for reinforcement followed by antibiotic ointment and a bandage.  He can take the bandage off on Monday start to shower as long as the incisions  remained healing well.  Continue nonweightbearing all times in the cam boot.  Continue to ice and elevate.  Pain medication as needed.  Discussed he can use compression up his leg to help with swelling that he gets throughout the day.  Monitoring signs or symptoms of a DVT otherwise. Monitor for any clinical signs or symptoms of infection and directed to call the office immediately should any occur or go to the ER.  Trula Slade DPM

## 2017-08-05 ENCOUNTER — Ambulatory Visit (INDEPENDENT_AMBULATORY_CARE_PROVIDER_SITE_OTHER): Payer: 59

## 2017-08-05 ENCOUNTER — Encounter: Payer: Self-pay | Admitting: Podiatry

## 2017-08-05 ENCOUNTER — Ambulatory Visit (INDEPENDENT_AMBULATORY_CARE_PROVIDER_SITE_OTHER): Payer: 59 | Admitting: Podiatry

## 2017-08-05 DIAGNOSIS — M9272 Juvenile osteochondrosis of metatarsus, left foot: Secondary | ICD-10-CM

## 2017-08-05 DIAGNOSIS — M79672 Pain in left foot: Secondary | ICD-10-CM | POA: Diagnosis not present

## 2017-08-05 DIAGNOSIS — T148XXA Other injury of unspecified body region, initial encounter: Secondary | ICD-10-CM

## 2017-08-08 DIAGNOSIS — T148XXA Other injury of unspecified body region, initial encounter: Secondary | ICD-10-CM | POA: Insufficient documentation

## 2017-08-08 NOTE — Progress Notes (Signed)
Subjective: Darren Gay is a 67 y.o. is seen today in office s/p left tibialis anterior tendon repair, 2nd metatarsal head resection, 3rd metatarsal osteotomy preformed on 06/29/2017.  He states he is doing well he has not been putting any weight to his foot is remained in the cam boot.  He states that his pain is controlled.  Still some swelling to his foot but overall looks better than he did prior to surgery.  No recent injury or falls.  He has no new concerns. Denies any systemic complaints such as fevers, chills, nausea, vomiting. No calf pain, chest pain, shortness of breath.   Objective: General: No acute distress, AAOx3  DP/PT pulses palpable 2/4, CRT < 3 sec to all digits.  Protective sensation intact. Motor function intact.  LEFT foot: Incision is well coapted without any evidence of dehiscence and sutures and staples are intact.  Scar is well formed to the ankle.  There is minimal opening along the dorsal digits to the foot and this appears to be worse scab has come off and there is a superficial small granular wound directly along the incision.  There is no surrounding erythema, ascending cellulitis.  There is no fluctuation or crepitation.  There is no malodor.  There is no clinical signs of infection.  Minimal tenderness palpation of the surgical site.  Tibialis anterior tendon appears to be intact and range of motion in normal limits and strength appears to be adequate.  There is no pain with calf compression, swelling, warmth, erythema  Assessment and Plan:  Status post left foot surgery, doing well with no complications   -Treatment options discussed including all alternatives, risks, and complications -Recommend a small amount of antibiotic ointment and a bandage on the dorsal foot daily.  Discussed to monitor incision closely.  Monitoring signs or symptoms of infection which we discussed today.  This was applied today. -Continue cam boot.  Is also to transition to partial  weightbearing in the cam boot most with a good all times.  Also will start physical therapy prescription was written today [facility].  Continue ice and elevate.  I will see him back in 2 weeks or sooner if needed.  Watch for any signs or symptoms of infection or DVT.  Trula Slade DPM

## 2017-08-12 ENCOUNTER — Telehealth: Payer: Self-pay | Admitting: Podiatry

## 2017-08-12 DIAGNOSIS — Z9889 Other specified postprocedural states: Secondary | ICD-10-CM

## 2017-08-12 NOTE — Telephone Encounter (Signed)
Left message informing pt Dr. Jacqualyn Posey had orders for PT in his clinicals but had not given the orders to me, I would give the orders to [facility] in the [place] office so they could call him Monday to schedule.

## 2017-08-12 NOTE — Addendum Note (Signed)
Addended by: Harriett Sine D on: 08/12/2017 02:25 PM   Modules accepted: Orders

## 2017-08-12 NOTE — Telephone Encounter (Signed)
I'm a pt of Dr. Leigh Aurora and I had surgery back in March with him. I saw him last Friday, 26 April and he gave me some new instructions. Told me he was going to set me up for some physical therapy. It has been a week and I have not heard anything so I just wanted to know what is going on. Please give me a call back at [phone] and you have my permission to leave a message if I do not answer. Thank you and have a good day.

## 2017-08-19 ENCOUNTER — Ambulatory Visit (INDEPENDENT_AMBULATORY_CARE_PROVIDER_SITE_OTHER): Payer: 59

## 2017-08-19 ENCOUNTER — Encounter: Payer: Self-pay | Admitting: Podiatry

## 2017-08-19 ENCOUNTER — Ambulatory Visit (INDEPENDENT_AMBULATORY_CARE_PROVIDER_SITE_OTHER): Payer: 59 | Admitting: Podiatry

## 2017-08-19 DIAGNOSIS — M9272 Juvenile osteochondrosis of metatarsus, left foot: Secondary | ICD-10-CM | POA: Diagnosis not present

## 2017-08-19 DIAGNOSIS — T148XXA Other injury of unspecified body region, initial encounter: Secondary | ICD-10-CM

## 2017-08-19 DIAGNOSIS — Z9889 Other specified postprocedural states: Secondary | ICD-10-CM

## 2017-08-21 NOTE — Progress Notes (Signed)
Subjective: Darren Gay is a 67 y.o. is seen today in office s/p left tibialis anterior tendon repair, 2nd metatarsal head resection, 3rd metatarsal osteotomy preformed on 06/29/2017.  He states that he is doing well.  He has gone to physical therapy session for this been helpful.  He has been putting some weight to the foot in the cam boot.  He denies any recent injury or fall.  Denies any increase in swelling.  He is not taking any pain medication.  No other concerns today. Denies any systemic complaints such as fevers, chills, nausea, vomiting. No calf pain, chest pain, shortness of breath.   Objective: General: No acute distress, AAOx3  DP/PT pulses palpable 2/4, CRT < 3 sec to all digits.  Protective sensation intact. Motor function intact.  LEFT foot: Incision is well coapted without any evidence of dehiscence and scars are formed all incisions.  There is no surrounding erythema, drainage or any clinical signs of infection.  There is no significant tenderness palpation of the surgical sites.  His strength to the tibialis anterior appears to be intact and there is normal strength of the ankle.  Mild continued swelling to the left foot and ankle however this does appear to be improved compared to what it was prior to surgery.  There is no erythema or warmth to the foot. There is no pain with calf compression, swelling, warmth, erythema  Assessment and Plan:  Status post left foot surgery, doing well with no complications   -Treatment options discussed including all alternatives, risks, and complications -He can start to become more weightbearing inside the cam boot but must wear the boot at all times.  He can remain partial weightbearing and as he is progressing with therapy just get to the point where he is walking in the boot.  Continue ice elevate.  Continue with physical therapy as well as home rehab exercises. -Follow-up in 2 to 3 weeks or sooner if needed.  Call any questions or  concerns.  Trula Slade DPM

## 2017-09-06 ENCOUNTER — Ambulatory Visit (AMBULATORY_SURGERY_CENTER): Payer: Self-pay | Admitting: *Deleted

## 2017-09-06 ENCOUNTER — Other Ambulatory Visit: Payer: Self-pay

## 2017-09-06 VITALS — Ht 72.0 in | Wt 220.0 lb

## 2017-09-06 DIAGNOSIS — Z86010 Personal history of colonic polyps: Secondary | ICD-10-CM

## 2017-09-06 NOTE — Progress Notes (Signed)
No egg or soy allergy known to patient  No issues with past sedation with any surgeries  or procedures, no intubation problems  No diet pills per patient No home 02 use per patient  No blood thinners per patient  Pt denies issues with constipation  No A fib or A flutter  EMMI video sent to pt's e mail  

## 2017-09-09 ENCOUNTER — Encounter: Payer: Self-pay | Admitting: Podiatry

## 2017-09-09 ENCOUNTER — Ambulatory Visit (INDEPENDENT_AMBULATORY_CARE_PROVIDER_SITE_OTHER): Payer: 59 | Admitting: Podiatry

## 2017-09-09 DIAGNOSIS — T148XXA Other injury of unspecified body region, initial encounter: Secondary | ICD-10-CM

## 2017-09-09 DIAGNOSIS — M9272 Juvenile osteochondrosis of metatarsus, left foot: Secondary | ICD-10-CM

## 2017-09-09 DIAGNOSIS — Z9889 Other specified postprocedural states: Secondary | ICD-10-CM

## 2017-09-09 DIAGNOSIS — R609 Edema, unspecified: Secondary | ICD-10-CM

## 2017-09-12 ENCOUNTER — Telehealth: Payer: Self-pay | Admitting: *Deleted

## 2017-09-12 DIAGNOSIS — R609 Edema, unspecified: Secondary | ICD-10-CM

## 2017-09-12 DIAGNOSIS — M7122 Synovial cyst of popliteal space [Baker], left knee: Secondary | ICD-10-CM

## 2017-09-12 NOTE — Telephone Encounter (Signed)
Orders faxed to [facility]. 

## 2017-09-12 NOTE — Telephone Encounter (Signed)
-----   Message from Trula Slade, DPM sent at 09/09/2017 11:40 AM EDT ----- Can you please order a venous reflux study due to swelling on the left? Thanks.

## 2017-09-13 ENCOUNTER — Ambulatory Visit (HOSPITAL_COMMUNITY)
Admission: RE | Admit: 2017-09-13 | Discharge: 2017-09-13 | Disposition: A | Discharge status: Home / Self Care | Payer: 59 | Source: Ambulatory Visit | Attending: Family | Admitting: Family

## 2017-09-13 DIAGNOSIS — R609 Edema, unspecified: Secondary | ICD-10-CM | POA: Diagnosis not present

## 2017-09-13 DIAGNOSIS — I872 Venous insufficiency (chronic) (peripheral): Secondary | ICD-10-CM | POA: Diagnosis not present

## 2017-09-13 NOTE — Progress Notes (Signed)
Subjective: Darren Gay is a 67 y.o. is seen today in office s/p left tibialis anterior tendon repair, 2nd metatarsal head resection, 3rd metatarsal osteotomy preformed on 06/29/2017.  He states that he wears the boot completely goes without the boot at home and he states that he has no pain.  He is concerned about the swelling to his leg.  He was getting swelling to the leg prior to the surgery and has continued.  Denies any pain to the leg.  He also states the swelling is going above the knee.  Denies any redness or warmth and swelling fluctuates.  Denies any systemic complaints such as fevers, chills, nausea, vomiting. No calf pain, chest pain, shortness of breath.   Objective: General: No acute distress, AAOx3  DP/PT pulses palpable 2/4, CRT < 3 sec to all digits.  Protective sensation intact. Motor function intact.  LEFT foot: Incision is well coapted without any evidence of dehiscence and scars are formed all incisions.  There is no surrounding erythema, drainage or any clinical signs of infection.  There is no significant tenderness palpation of the surgical sites.  He has great strength of his ankle MMT is 5/5.  There is swelling to the left leg to the level of the knee.  There is no erythema or increase in warmth in the calf is supple.  There is no pain with compression of the calf.  Assessment and Plan:  Status post left foot surgery, doing well with no complications   -Treatment options discussed including all alternatives, risks, and complications -Overall it appears that he is healing well from the surgery.  However there is swelling to the left leg.  This was ongoing prior to surgery as well but is been continued.  Also describes swelling of the knee.  This is not related to the surgery itself.  We will order a venous reflux study.  Monitoring signs or symptoms of DVT by the ER should any occur.  Continue compression for now.  Continue with physical therapy.  He can start to transition to  regular shoe as tolerable. Monitor for any clinical signs or symptoms of infection and directed to call the office immediately should any occur or go to the ER. -Will extend his out of work until July 1st.   Return in about 3 weeks (around 09/30/2017).  Trula Slade DPM

## 2017-09-14 ENCOUNTER — Encounter: Payer: Self-pay | Admitting: Internal Medicine

## 2017-09-14 NOTE — Addendum Note (Signed)
Addended by: Harriett Sine D on: 09/14/2017 04:34 PM   Modules accepted: Orders

## 2017-09-14 NOTE — Telephone Encounter (Signed)
Faxed compression sock rx to [facility]. Referral, clinical and demographics to [facility].

## 2017-09-14 NOTE — Telephone Encounter (Signed)
Dr. Jacqualyn Posey states the venous reflux study shows he does have venous insuffiencey and orders knee-high 15-65mmHg compression hose, and it also shows Baker's Cyst behind the left knee. Left message for pt to call for results.

## 2017-09-14 NOTE — Telephone Encounter (Signed)
Pt called states he is returning my call for results. I informed pt of Dr. Leigh Aurora review of venous reflux results and order for compression hose and referral to orthopedics for evaluation of the left leg Baker's Cyst. Pt states he has seen Dr. Sharol Given. I told pt I would mail a copy of the compression hose rx to him and fax the rx to the medical supply facility and referral to Dr. Jess Barters office.

## 2017-09-15 ENCOUNTER — Telehealth: Payer: Self-pay | Admitting: *Deleted

## 2017-09-15 DIAGNOSIS — M7122 Synovial cyst of popliteal space [Baker], left knee: Secondary | ICD-10-CM

## 2017-09-15 NOTE — Telephone Encounter (Signed)
Pt states he would prefer to be seen by Dr. Mardelle Matte at [facility] and [place] rather than  Dr. Sharol Given as he requested. Referral to Dr. Mardelle Matte faxed with clinicals and demographics.

## 2017-09-20 ENCOUNTER — Other Ambulatory Visit: Payer: Self-pay

## 2017-09-20 ENCOUNTER — Ambulatory Visit (AMBULATORY_SURGERY_CENTER): Payer: 59 | Admitting: Internal Medicine

## 2017-09-20 ENCOUNTER — Encounter: Payer: Self-pay | Admitting: Internal Medicine

## 2017-09-20 VITALS — BP 123/68 | HR 54 | Temp 98.4°F | Resp 13 | Ht 72.0 in | Wt 220.0 lb

## 2017-09-20 DIAGNOSIS — D122 Benign neoplasm of ascending colon: Secondary | ICD-10-CM

## 2017-09-20 DIAGNOSIS — Z86010 Personal history of colonic polyps: Secondary | ICD-10-CM | POA: Diagnosis not present

## 2017-09-20 MED ORDER — SODIUM CHLORIDE 0.9 % IV SOLN: 500.0000 mL | Freq: Once | INTRAVENOUS | Status: DC

## 2017-09-20 NOTE — Patient Instructions (Addendum)
   I found and removed one tiny polyp. Your next routine colonoscopy should be in 5 years - 2024.  I appreciate the opportunity to care for you. Gatha Mayer, MD, [facility]   Discharge instructions given. Handouts on polyps and diverticulosis. Resume previous medications. YOU HAD AN ENDOSCOPIC PROCEDURE TODAY AT [facility]:   Refer to the procedure report that was given to you for any specific questions about what was found during the examination.  If the procedure report does not answer your questions, please call your gastroenterologist to clarify.  If you requested that your care partner not be given the details of your procedure findings, then the procedure report has been included in a sealed envelope for you to review at your convenience later.  YOU SHOULD EXPECT: Some feelings of bloating in the abdomen. Passage of more gas than usual.  Walking can help get rid of the air that was put into your GI tract during the procedure and reduce the bloating. If you had a lower endoscopy (such as a colonoscopy or flexible sigmoidoscopy) you may notice spotting of blood in your stool or on the toilet paper. If you underwent a bowel prep for your procedure, you may not have a normal bowel movement for a few days.  Please Note:  You might notice some irritation and congestion in your nose or some drainage.  This is from the oxygen used during your procedure.  There is no need for concern and it should clear up in a day or so.  SYMPTOMS TO REPORT IMMEDIATELY:   Following lower endoscopy (colonoscopy or flexible sigmoidoscopy):  Excessive amounts of blood in the stool  Significant tenderness or worsening of abdominal pains  Swelling of the abdomen that is new, acute  Fever of 100F or higher  For urgent or emergent issues, a gastroenterologist can be reached at any hour by calling [phone].   DIET:  We do recommend a small meal at first, but then you may proceed to your  regular diet.  Drink plenty of fluids but you should avoid alcoholic beverages for 24 hours.  ACTIVITY:  You should plan to take it easy for the rest of today and you should NOT DRIVE or use heavy machinery until tomorrow (because of the sedation medicines used during the test).    FOLLOW UP: Our staff will call the number listed on your records the next business day following your procedure to check on you and address any questions or concerns that you may have regarding the information given to you following your procedure. If we do not reach you, we will leave a message.  However, if you are feeling well and you are not experiencing any problems, there is no need to return our call.  We will assume that you have returned to your regular daily activities without incident.  If any biopsies were taken you will be contacted by phone or by letter within the next 1-3 weeks.  Please call us at [phone] if you have not heard about the biopsies in 3 weeks.    SIGNATURES/CONFIDENTIALITY: You and/or your care partner have signed paperwork which will be entered into your electronic medical record.  These signatures attest to the fact that that the information above on your After Visit Summary has been reviewed and is understood.  Full responsibility of the confidentiality of this discharge information lies with you and/or your care-partner.

## 2017-09-20 NOTE — Progress Notes (Signed)
Pt's states no medical or surgical changes since previsit or office visit. 

## 2017-09-20 NOTE — Op Note (Signed)
[place] Patient Name: Darren Gay Procedure Date: 09/20/2017 1:38 PM MRN: [id number] Endoscopist: Gatha Mayer , MD Age: 67 Referring MD:  Date of Birth: 1951-02-28 Gender: Male Account #: [id number] Procedure:                Colonoscopy Indications:              Surveillance: Personal history of adenomatous                            polyps on last colonoscopy > 3 years ago Medicines:                Propofol per Anesthesia, Monitored Anesthesia Care Procedure:                Pre-Anesthesia Assessment:                           - Prior to the procedure, a History and Physical                            was performed, and patient medications and                            allergies were reviewed. The patient's tolerance of                            previous anesthesia was also reviewed. The risks                            and benefits of the procedure and the sedation                            options and risks were discussed with the patient.                            All questions were answered, and informed consent                            was obtained. Prior Anticoagulants: The patient has                            taken no previous anticoagulant or antiplatelet                            agents. ASA Grade Assessment: II - A patient with                            mild systemic disease. After reviewing the risks                            and benefits, the patient was deemed in                            satisfactory condition to undergo the procedure.  After obtaining informed consent, the colonoscope                            was passed under direct vision. Throughout the                            procedure, the patient's blood pressure, pulse, and                            oxygen saturations were monitored continuously. The                            Colonoscope was introduced through the anus and   advanced to the the cecum, identified by                            appendiceal orifice and ileocecal valve. The                            colonoscopy was performed without difficulty. The                            patient tolerated the procedure well. The quality                            of the bowel preparation was adequate. The                            ileocecal valve, appendiceal orifice, and rectum                            were photographed. The bowel preparation used was                            Miralax. Scope In: 1:44:30 PM Scope Out: 1:58:09 PM Scope Withdrawal Time: 0 hours 11 minutes 21 seconds  Total Procedure Duration: 0 hours 13 minutes 39 seconds  Findings:                 The perianal and digital rectal examinations were                            normal. Pertinent negatives include normal prostate                            (size, shape, and consistency).                           A 2 mm polyp was found in the ascending colon. The                            polyp was sessile. The polyp was removed with a                            cold biopsy forceps. Resection and  retrieval were                            complete. Verification of patient identification                            for the specimen was done. Estimated blood loss was                            minimal.                           Multiple diverticula were found in the sigmoid                            colon.                           The exam was otherwise without abnormality on                            direct and retroflexion views. Complications:            No immediate complications. Estimated Blood Loss:     Estimated blood loss was minimal. Impression:               - One 2 mm polyp in the ascending colon, removed                            with a cold biopsy forceps. Resected and retrieved.                           - Diverticulosis in the sigmoid colon.                           - The  examination was otherwise normal on direct                            and retroflexion views.                           - Personal history of colonic polyps. Recommendation:           - Patient has a contact number available for                            emergencies. The signs and symptoms of potential                            delayed complications were discussed with the                            patient. Return to normal activities tomorrow.                            Written discharge instructions were provided to the  patient.                           - Resume previous diet.                           - Continue present medications.                           - Repeat colonoscopy is recommended for                            surveillance. The colonoscopy date will be                            determined after pathology results from today's                            exam become available for review.                           - Should be 5 yrs - has hx adenoms 2015 also Gatha Mayer, MD 09/20/2017 2:12:14 PM This report has been signed electronically.

## 2017-09-20 NOTE — Progress Notes (Signed)
Called to room to assist during endoscopic procedure.  Patient ID and intended procedure confirmed with present staff. Received instructions for my participation in the procedure from the performing physician.  

## 2017-09-21 ENCOUNTER — Telehealth: Payer: Self-pay

## 2017-09-21 ENCOUNTER — Telehealth: Payer: Self-pay | Admitting: *Deleted

## 2017-09-21 NOTE — Telephone Encounter (Signed)
Left message on f/u call 

## 2017-09-21 NOTE — Telephone Encounter (Signed)
  Follow up Call-  Call back number 09/20/2017  Post procedure Call Back phone  # [phone]  Permission to leave phone message Yes  Some recent data might be hidden     Patient questions:  Do you have a fever, pain , or abdominal swelling? No. Pain Score  0 *  Have you tolerated food without any problems? Yes.    Have you been able to return to your normal activities? Yes.    Do you have any questions about your discharge instructions: Diet   No. Medications  No. Follow up visit  No.  Do you have questions or concerns about your Care? No.  Actions: * If pain score is 4 or above: No action needed, pain <4.

## 2017-09-25 ENCOUNTER — Encounter: Payer: Self-pay | Admitting: Internal Medicine

## 2017-09-30 ENCOUNTER — Encounter: Payer: Self-pay | Admitting: Podiatry

## 2017-09-30 ENCOUNTER — Ambulatory Visit (INDEPENDENT_AMBULATORY_CARE_PROVIDER_SITE_OTHER): Payer: 59 | Admitting: Podiatry

## 2017-09-30 ENCOUNTER — Ambulatory Visit (INDEPENDENT_AMBULATORY_CARE_PROVIDER_SITE_OTHER): Payer: 59

## 2017-09-30 DIAGNOSIS — M9272 Juvenile osteochondrosis of metatarsus, left foot: Secondary | ICD-10-CM

## 2017-09-30 DIAGNOSIS — R609 Edema, unspecified: Secondary | ICD-10-CM

## 2017-09-30 DIAGNOSIS — Z9889 Other specified postprocedural states: Secondary | ICD-10-CM

## 2017-09-30 NOTE — Progress Notes (Signed)
Subjective: Darren Gay is a 67 y.o. is seen today in office s/p left tibialis anterior tendon repair, 2nd metatarsal head resection, 3rd metatarsal osteotomy preformed on 06/29/2017.  From a surgical standpoint he is doing well.  He finished physical therapy and he has very minimal pain to his foot is been wearing a regular shoe.  He has been trying to wear his boot to go back to work however he cannot do this quite yet given the swelling.  He did follow-up with orthopedics and he has a Baker's cyst.  He was sent to rehab for lymphedema for the left leg.  Denies any redness to the leg.  He again reports to me today that he had swelling about his knee and his leg.  This was ongoing prior to the surgery.  Denies any systemic complaints such as fevers, chills, nausea, vomiting. No calf pain, chest pain, shortness of breath.   Objective: General: No acute distress, AAOx3  DP/PT pulses palpable 2/4, CRT < 3 sec to all digits.  Protective sensation intact. Motor function intact.  LEFT foot: Incision is well coapted without any evidence of dehiscence and scars are formed all incisions.  There is no tenderness palpation of the surgical site.  Tibialis anterior strength appears to be adequate he has some more normal gait pattern. No pain in the forefoot on the prior surgical sites.  No other areas of tenderness identified at this time today.  There is chronic swelling to the left leg but there is no erythema or increase in warmth there is no pain with calf compression.  Assessment and Plan:  Status post left foot surgery, doing well with no complications; venous insufficiency  -Treatment options discussed including all alternatives, risks, and complications -From surgical standpoint he is doing well.  However he still cannot wear his walking boot given the swelling to the left leg.  He is only starting treatment for this next week.  Encouraged to continue elevation and compression as well.  As he cannot wear  his regular boot we will can extend his out of work state until July 22.  Continue home rehab exercises as well.    Return in about 6 weeks (around 11/11/2017).  Trula Slade DPM

## 2017-10-03 ENCOUNTER — Ambulatory Visit (HOSPITAL_COMMUNITY): Payer: 59 | Admitting: Physical Therapy

## 2017-10-05 ENCOUNTER — Encounter: Payer: Self-pay | Admitting: Podiatry

## 2017-10-17 ENCOUNTER — Encounter (HOSPITAL_COMMUNITY): Payer: Self-pay | Admitting: Physical Therapy

## 2017-10-17 ENCOUNTER — Ambulatory Visit (HOSPITAL_COMMUNITY): Payer: 59 | Attending: Orthopedic Surgery | Admitting: Physical Therapy

## 2017-10-17 ENCOUNTER — Other Ambulatory Visit: Payer: Self-pay

## 2017-10-17 DIAGNOSIS — I890 Lymphedema, not elsewhere classified: Secondary | ICD-10-CM | POA: Diagnosis present

## 2017-10-17 NOTE — Therapy (Signed)
Darren Gay, [place], [address] Phone: [phone]   Fax:  [phone]  Physical Therapy Evaluation  Patient Details  Name: Darren Gay MRN: [id number] Date of Birth: 1950/07/06 Referring Provider: Marchia Bond   Encounter Date: 10/17/2017  PT End of Session - 10/17/17 1146    Visit Number  1    Number of Visits  12    Date for PT Re-Evaluation  11/16/17    Authorization Type  [place] - Visit Number  15    Authorization - Number of Visits  30    PT Start Time  0945    PT Stop Time  1030    PT Time Calculation (min)  45 min    Activity Tolerance  Patient tolerated treatment well    Behavior During Therapy  [facility] for tasks assessed/performed       Past Medical History:  Diagnosis Date  . Allergy   . Arthritis   . Depression   . Gout   . High cholesterol    borderline  . Hx of adenomatous colonic polyps 03/29/2014  . Hypertension   . Sleep apnea    wears cpap  . Substance abuse [facility])     Past Surgical History:  Procedure Laterality Date  . ANKLE SURGERY     tendon repair  . BUNIONECTOMY    . CARPAL TUNNEL RELEASE Bilateral   . COLONOSCOPY    . KNEE ARTHROSCOPY  2010  . TONSILLECTOMY  1960  . VASECTOMY  1991    There were no vitals filed for this visit.   Subjective Assessment - 10/17/17 1144    Subjective  Darren Gay states that he had a Lt anterior tibialis tendon repair with 2nd and 3rd MTP osteotomy on 06/29/2017.  He had therapy for the foot and everything was looking good.  One day he woke up and his whole Lt leg was swollen up.  It has gotten better but he is still having swelling that increases as the day goes on.  An ultrasound was completed which was negative for DVT.  His ankle gets so big that he loses motion in his ankle therefore he was referred to skilled physical therapy.     Pertinent History  as above     How long can you sit comfortably?  no problem      Currently in Pain?  No/denies         [facility] PT Assessment - 10/17/17 0001      Assessment   Medical Diagnosis  Lt LE lymphedema     Referring Provider  Marchia Bond    Onset Date/Surgical Date  06/29/17    Next MD Visit  not scheduled    Prior Therapy  not for lymphedema      Precautions   Precautions  Other (comment)    Precaution Comments  cellulitis      Restrictions   Weight Bearing Restrictions  No      Balance Screen   Has the patient fallen in the past 6 months  No    Has the patient had a decrease in activity level because of a fear of falling?   Yes    Is the patient reluctant to leave their home because of a fear of falling?   No      Home Film/video editor residence      Prior Function  Level of Independence  Independent      Cognition   Overall Cognitive Status  Within Functional Limits for tasks assessed        LYMPHEDEMA/ONCOLOGY QUESTIONNAIRE - 10/17/17 1013      What other symptoms do you have   Are you Having Heaviness or Tightness  Yes    Are you having Pain  No    Are you having pitting edema  Yes    Body Site  left leg    Is it Hard or Difficult finding clothes that fit  Yes    Do you have infections  No    Is there Decreased scar mobility  Yes      Lymphedema Stage   Stage  STAGE 2 SPONTANEOUSLY IRREVERSIBLE      Lymphedema Assessments   Lymphedema Assessments  Lower extremities      Right Lower Extremity Lymphedema   At Midpatella/Popliteal Crease  42 cm    30 cm Proximal to Floor at Lateral Plantar Foot  38.5 cm    20 cm Proximal to Floor at Lateral Plantar Foot  28.7 1    10  cm Proximal to Floor at Lateral Malleoli  25.8 cm    Circumference of ankle/heel  37 cm.    5 cm Proximal to 1st MTP Joint  26.5 cm    Across MTP Joint  26 cm      Left Lower Extremity Lymphedema   At Midpatella/Popliteal Crease  42 cm    30 cm Proximal to Floor at Lateral Plantar Foot  39.8 cm    20 cm Proximal to Floor at  Lateral Plantar Foot  31.2 cm    10 cm Proximal to Floor at Lateral Malleoli  32.3 cm    Circumference of ankle/heel  39.8 cm.    5 cm Proximal to 1st MTP Joint  30.8 cm    Across MTP Joint  30 cm    Around Proximal Great Toe  9.8 cm    Other  12             Objective measurements completed on examination: See above findings.      [place] Adult PT Treatment/Exercise - 10/17/17 0001      Manual Therapy   Manual Therapy  Compression Bandaging    Manual therapy comments  done seperate from all other aspects of treatment     Compression Bandaging  1/2 " foam cut for LE, Therapist then completed compression bandaging to Lt LE using foam and multiple layer of short stretch bandaging.              PT Education - 10/17/17 1145    Education Details  LE exercises to increase lymphatic flow, What lymphedema is and the treatment for it.     Person(s) Educated  Patient    Methods  Explanation    Comprehension  Verbalized understanding       PT Short Term Goals - 10/17/17 1202      PT SHORT TERM GOAL #1   Title  Pt volume to be down by 2 cm to allow pt to move his Lt foot with greater ease.     Time  2    Period  Weeks    Status  New    Target Date  10/31/17      PT SHORT TERM GOAL #2   Title  Pt to verbalize the chronic nature of lymphedma and the fact that he will need to wear compression  bandaging everyday.     Time  2    Period  Weeks    Status  New        PT Long Term Goals - 10/17/17 1203      PT LONG TERM GOAL #1   Title  PT volume to be down 4-6 cm to allow pt to transition into compression garments to allow pt to shower.     Time  4    Period  Weeks    Status  New      PT LONG TERM GOAL #2   Title  PT to be able to don and doff his shoes with ease.     Time  4    Period  Weeks    Status  New      PT LONG TERM GOAL #3   Title  Pt to have obtained and to be able to don and doff his compression garments.     Time  4    Period  Weeks    Status  New              Plan - 10/17/17 1155    Clinical Impression Statement  Darren Gay is a 67 yo male who has had two recent surgeries on his left foot.  He went to therapy after the surgery to regain his motion and strength.  Everything went well until he woke up with his left leg with significan swelling from his thigh to his foot.  The swelling has now gone down in his thigh but it remains in his LE and foot.  He has been referred to skilled physical therapy for lymphedema services.  Evaluation demonstrates increased edema and induration in Mr. Zwilling left foot.  Mr. Fortunato will benefit from total decongestive techniques to reduce volumes and education on lymphedema.     Rehab Potential  Good    PT Frequency  3x / week    PT Duration  4 weeks    PT Treatment/Interventions  Manual lymph drainage;Compression bandaging    PT Next Visit Plan  begin manual lymph drainage, answer any questions complete compression bandaging using foam and short stretch bandages.  Measure on Mondays.     PT Home Exercise Plan  For LE lymph circulation    Consulted and Agree with Plan of Care  Patient       Patient will benefit from skilled therapeutic intervention in order to improve the following deficits and impairments:  Impaired flexibility, Increased edema  Visit Diagnosis: Lymphedema, not elsewhere classified     Problem List Patient Active Problem List   Diagnosis Date Noted  . Tendon tear 08/08/2017  . Acute pain of right shoulder 07/01/2017  . Bunion, left 06/11/2016  . 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 Hx of adenomatous colonic polyps 03/29/2014  . Chest pain 01/13/2013  . Plantar fasciitis of right foot 01/21/2012  . TESTICULAR HYPOFUNCTION 06/13/2009  . HYPERTENSION 06/03/2008  . DEPRESSION 12/26/2006  . ALLERGIC RHINITIS 10/20/2006  . SLEEP APNEA 10/20/2006    Rayetta Humphrey, PT CLT [phone] 10/17/2017, 12:05 PM  [facility] [address] [place], [place], [address] Phone: [phone]   Fax:  [phone]  Name: KOLLIN UDELL MRN: [id number] Date of Birth: 1950/12/12

## 2017-10-19 ENCOUNTER — Ambulatory Visit (HOSPITAL_COMMUNITY): Payer: 59 | Admitting: Physical Therapy

## 2017-10-19 ENCOUNTER — Encounter (HOSPITAL_COMMUNITY): Payer: Self-pay | Admitting: Physical Therapy

## 2017-10-19 DIAGNOSIS — I890 Lymphedema, not elsewhere classified: Secondary | ICD-10-CM

## 2017-10-19 NOTE — Therapy (Signed)
[place], [place], [address] Phone: [phone]   Fax:  [phone]  Physical Therapy Treatment  Patient Details  Name: Darren Gay MRN: [id number] Date of Birth: Jan 27, 1951 Referring Provider: Marchia Bond   Encounter Date: 10/19/2017  PT End of Session - 10/19/17 1210    Visit Number  2    Number of Visits  12    Date for PT Re-Evaluation  11/16/17    Authorization Type  [place] - Visit Number  16    Authorization - Number of Visits  30    PT Start Time  [phone]    PT Stop Time  1035    PT Time Calculation (min)  40 min    Activity Tolerance  Patient tolerated treatment well    Behavior During Therapy  [facility] for tasks assessed/performed       Past Medical History:  Diagnosis Date  . Allergy   . Arthritis   . Depression   . Gout   . High cholesterol    borderline  . Hx of adenomatous colonic polyps 03/29/2014  . Hypertension   . Sleep apnea    wears cpap  . Substance abuse [facility])     Past Surgical History:  Procedure Laterality Date  . ANKLE SURGERY     tendon repair  . BUNIONECTOMY    . CARPAL TUNNEL RELEASE Bilateral   . COLONOSCOPY    . KNEE ARTHROSCOPY  2010  . TONSILLECTOMY  1960  . VASECTOMY  1991    There were no vitals filed for this visit.  Subjective Assessment - 10/19/17 1206    Subjective  PT states that the bandages were comfortable and he was very pleased with the way his leg looked today when he took the bandages off.     How long can you sit comfortably?  no problem     Currently in Pain?  No/denies                       [facility] Adult PT Treatment/Exercise - 10/19/17 0001      Manual Therapy   Manual Therapy  Manual Lymphatic Drainage (MLD);Compression Bandaging    Manual therapy comments  done seperate from all other aspects of treatment     Manual Lymphatic Drainage (MLD)  To include supraclavicular deep and superfical  abdominall Lt inguinal/axillary anastomosis followed by LE.  LE done posterirorly with pt prone.  Did not route to Rt LE as therapist noted swelling in Rt LE as well     Compression Bandaging  1/2" foam with multiple layer of short stretch bandages.              PT Education - 10/19/17 1210    Education Details  began eduucating in self wrapping     Person(s) Educated  Patient    Methods  Explanation    Comprehension  Verbalized understanding;Need further instruction       PT Short Term Goals - 10/17/17 1202      PT SHORT TERM GOAL #1   Title  Pt volume to be down by 2 cm to allow pt to move his Lt foot with greater ease.     Time  2    Period  Weeks    Status  New    Target Date  10/31/17      PT SHORT TERM GOAL #2   Title  Pt to verbalize the chronic nature of lymphedma and the fact that he will need to wear compression bandaging everyday.     Time  2    Period  Weeks    Status  New        PT Long Term Goals - 10/17/17 1203      PT LONG TERM GOAL #1   Title  PT volume to be down 4-6 cm to allow pt to transition into compression garments to allow pt to shower.     Time  4    Period  Weeks    Status  New      PT LONG TERM GOAL #2   Title  PT to be able to don and doff his shoes with ease.     Time  4    Period  Weeks    Status  New      PT LONG TERM GOAL #3   Title  Pt to have obtained and to be able to don and doff his compression garments.     Time  4    Period  Weeks    Status  New            Plan - 10/19/17 1211    Clinical Impression Statement  Reviewed evalustaion and goals with pt.  LE has significant less induration but foot remain edematous.  Therapist did not route fluid to Rt inguinal area due to slight edema in right LE.  PT will continue to benefit from skilled PT for total decongestive techniques.      Rehab Potential  Good    PT Frequency  3x / week    PT Duration  4 weeks    PT Treatment/Interventions  Manual lymph  drainage;Compression bandaging    PT Next Visit Plan   Continue with instruction for short stretch bandaging; begin self massage instruction. Measure on Mondays.     PT Home Exercise Plan  For LE lymph circulation    Consulted and Agree with Plan of Care  Patient       Patient will benefit from skilled therapeutic intervention in order to improve the following deficits and impairments:  Impaired flexibility, Increased edema  Visit Diagnosis: Lymphedema, not elsewhere classified     Problem List Patient Active Problem List   Diagnosis Date Noted  . Tendon tear 08/08/2017  . Acute pain of right shoulder 07/01/2017  . Bunion, left 06/11/2016  . 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 Hx of adenomatous colonic polyps 03/29/2014  . Chest pain 01/13/2013  . Plantar fasciitis of right foot 01/21/2012  . TESTICULAR HYPOFUNCTION 06/13/2009  . HYPERTENSION 06/03/2008  . DEPRESSION 12/26/2006  . ALLERGIC RHINITIS 10/20/2006  . SLEEP APNEA 10/20/2006  Rayetta Humphrey, PT CLT [phone] 10/19/2017, 12:14 PM  [facility] [address] [place], [place], [address] Phone: [phone]   Fax:  [phone]  Name: Darren Gay MRN: [id number] Date of Birth: [birth year]

## 2017-10-21 ENCOUNTER — Ambulatory Visit (HOSPITAL_COMMUNITY): Payer: 59 | Admitting: Physical Therapy

## 2017-10-21 ENCOUNTER — Encounter (HOSPITAL_COMMUNITY): Payer: Self-pay | Admitting: Physical Therapy

## 2017-10-21 ENCOUNTER — Telehealth (HOSPITAL_COMMUNITY): Payer: Self-pay | Admitting: Physical Therapy

## 2017-10-21 DIAGNOSIS — I890 Lymphedema, not elsewhere classified: Secondary | ICD-10-CM | POA: Diagnosis not present

## 2017-10-21 NOTE — Therapy (Addendum)
Dar[place] Gay, [place], [address] Phone: [phone]   Fax:  [phone]  Physical Therapy Treatment/Discharge  Patient Details  Name: Darren Gay MRN: [id number] Date of Birth: 1950/06/26 Referring Provider: Marchia Bond   Encounter Date: 10/21/2017  PT End of Session - 10/21/17 1133    Visit Number  3    Number of Visits  3   Date for PT Re-Evaluation  11/16/17    Authorization Type  [place] - Visit Number  17    Authorization - Number of Visits  30    PT Start Time  1030    PT Stop Time  1115    PT Time Calculation (min)  45 min    Activity Tolerance  Patient tolerated treatment well    Behavior During Therapy  [facility] for tasks assessed/performed       Past Medical History:  Diagnosis Date  . Allergy   . Arthritis   . Depression   . Gout   . High cholesterol    borderline  . Hx of adenomatous colonic polyps 03/29/2014  . Hypertension   . Sleep apnea    wears cpap  . Substance abuse [facility])     Past Surgical History:  Procedure Laterality Date  . ANKLE SURGERY     tendon repair  . BUNIONECTOMY    . CARPAL TUNNEL RELEASE Bilateral   . COLONOSCOPY    . KNEE ARTHROSCOPY  2010  . TONSILLECTOMY  1960  . VASECTOMY  1991    There were no vitals filed for this visit.  Subjective Assessment - 10/21/17 1131    Subjective  PT states that his left leg is as small as his right at this time.   Pt states that he is going to ride down to [address] this afternoon and get his compression garment.     How long can you sit comfortably?  no problem     Currently in Pain?  No/denies                       [facility] Adult PT Treatment/Exercise - 10/21/17 0001      Manual Therapy   Manual Therapy  Manual Lymphatic Drainage (MLD);Compression Bandaging    Manual therapy comments  taught pt self manual techniques Pt given instruction sheet     Manual Lymphatic Drainage (MLD)   Self massaging techinques   Compression Bandaging  1/2" foam with multiple layer of short stretch bandages.              PT Education - 10/21/17 1133    Education Details  self manual techniques     Person(s) Educated  Patient    Methods  Explanation;Handout    Comprehension  Verbalized understanding;Returned demonstration       PT Short Term Goals - 10/21/17 1137      PT SHORT TERM GOAL #1   Title  Pt volume to be down by 2 cm to allow pt to move his Lt foot with greater ease.     Time  2    Period  Weeks    Status  Achieved      PT SHORT TERM GOAL #2   Title  Pt to verbalize the chronic nature of lymphedma and the fact that he will need to wear compression bandaging everyday.     Time  2    Period  Weeks  Status  Achieved        PT Long Term Goals - 10/21/17 1137      PT LONG TERM GOAL #1   Title  PT volume to be down 4-6 cm to allow pt to transition into compression garments to allow pt to shower.     Time  4    Period  Weeks    Status  Achieved      PT LONG TERM GOAL #2   Title  PT to be able to don and doff his shoes with ease.     Time  4    Period  Weeks    Status  Achieved      PT LONG TERM GOAL #3   Title  Pt to have obtained and to be able to don and doff his compression garments.     Time  4    Period  Weeks    Status  Achieved            Plan - 10/21/17 1134    Clinical Impression Statement  PT Lt LE now appears smaller than right.  Pt prescription for compression has came in and pt is going to puchase compression garment this afternoon.  Pt was instructed in self manual techniques as well as given instruction sheet on how to care for short stretch bandages.    PT called back in the afternoon stating that he had gone and purchased his compression garments.  They fit and felt good and he did not feel that he needed to come back on Monday.  PT desired to be discharged.    Rehab Potential  Good    PT Frequency  3x / week    PT Duration  4  weeks    PT Treatment/Interventions  Manual lymph drainage;Compression bandaging    PT Next Visit Plan  Measure monday, answer any questions and discharge.     PT Home Exercise Plan  For LE lymph circulation    Consulted and Agree with Plan of Care  Patient       Patient will benefit from skilled therapeutic intervention in order to improve the following deficits and impairments:  Impaired flexibility, Increased edema  Visit Diagnosis: Lymphedema, not elsewhere classified     Problem List Patient Active Problem List   Diagnosis Date Noted  . Tendon tear 08/08/2017  . Acute pain of right shoulder 07/01/2017  . Bunion, left 06/11/2016  . 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 Hx of adenomatous colonic polyps 03/29/2014  . Chest pain 01/13/2013  . Plantar fasciitis of right foot 01/21/2012  . TESTICULAR HYPOFUNCTION 06/13/2009  . HYPERTENSION 06/03/2008  . DEPRESSION 12/26/2006  . ALLERGIC RHINITIS 10/20/2006  . SLEEP APNEA 10/20/2006    Rayetta Humphrey, PT CLT [phone] 10/21/2017, 11:38 AM  [employer] [place], [place], [address] Phone: [phone]   Fax:  [phone]  Name: Darren Gay MRN: [id number] Date of Birth: 05/17/1950   PHYSICAL THERAPY DISCHARGE SUMMARY  Visits from Start of Care: 3  Current functional level related to goals / functional outcomes: I Remaining deficits: none   Education / Equipment: Self massage, exercises, the importance of using compression  Plan: Patient agrees to discharge.  Patient goals were met. Patient is being discharged due to meeting the stated rehab goals.  ?????        Rayetta Humphrey, [place] CLT [phone]

## 2017-10-21 NOTE — Telephone Encounter (Signed)
He went to [employer] and got his socks - they feel great and he said that the people there took really good care of him. He also states he is thankful for all that we did to help him get better. Please D/C

## 2017-10-24 ENCOUNTER — Ambulatory Visit (INDEPENDENT_AMBULATORY_CARE_PROVIDER_SITE_OTHER): Payer: 59 | Admitting: Podiatry

## 2017-10-24 ENCOUNTER — Ambulatory Visit (HOSPITAL_COMMUNITY): Payer: 59 | Admitting: Physical Therapy

## 2017-10-24 ENCOUNTER — Encounter: Payer: Self-pay | Admitting: Podiatry

## 2017-10-24 ENCOUNTER — Telehealth: Payer: Self-pay | Admitting: Podiatry

## 2017-10-24 DIAGNOSIS — I872 Venous insufficiency (chronic) (peripheral): Secondary | ICD-10-CM | POA: Diagnosis not present

## 2017-10-24 DIAGNOSIS — R609 Edema, unspecified: Secondary | ICD-10-CM | POA: Diagnosis not present

## 2017-10-24 NOTE — Telephone Encounter (Signed)
Note placed on pt's appt slot on schedule for today.

## 2017-10-24 NOTE — Telephone Encounter (Signed)
Pt would like extension on going back to work until he comes in to be seen . Pt is still having swelling and would like to speak with Dr. Jacqualyn Posey before returning to work.

## 2017-10-25 NOTE — Progress Notes (Signed)
Subjective: 67 year old male presents the office today due to swelling to both of his legs.  Since I last saw him he did complete physical therapy he can do compression wraps himself.  He has no swelling is greatly improved however the swelling he wants to hold off and return back to work until August 5 toe is under better control as he has to wear a boot when he is at work.  From a surgical standpoint he states he is doing well and is having no pain to the surgical site he can walk much better and has been wearing a regular shoe.  He has no other concerns today. Denies any systemic complaints such as fevers, chills, nausea, vomiting. No acute changes since last appointment, and no other complaints at this time.   Objective: AAO x3, NAD DP/PT pulses palpable bilaterally, CRT less than 3 seconds Incisions from the surgery are all well-healed.  There is no tenderness palpation of the surgical sites.  Ankle, subtalar joint range of motion intact.  Manual muscle strength is 5/5 in the tendons appear to be intact.  There is no open lesions or pre-ulcerative lesions.  No signs of infection.  There is chronic bilateral swelling left side worse than right. No open lesions or pre-ulcerative lesions.  No pain with calf compression, swelling, warmth, erythema  Assessment: Venous insufficiency  Plan: -All treatment options discussed with the patient including all alternatives, risks, complications.  -As tolerated continue with compression.  Is been wearing compression socks is been helpful.  He does not continue to work on the compression wraps as well at home and continue elevation.  We will plan to return to work on August 5.  From a surgical standpoint he is done well he can return to work as he is able to wear regular work shoe. -Patient encouraged to call the office with any questions, concerns, change in symptoms.   Trula Slade DPM

## 2017-10-26 ENCOUNTER — Encounter (HOSPITAL_COMMUNITY): Payer: 59 | Admitting: Physical Therapy

## 2017-10-28 ENCOUNTER — Encounter (HOSPITAL_COMMUNITY): Payer: 59 | Admitting: Physical Therapy

## 2017-10-31 ENCOUNTER — Encounter (HOSPITAL_COMMUNITY): Payer: 59 | Admitting: Physical Therapy

## 2017-11-02 ENCOUNTER — Encounter (HOSPITAL_COMMUNITY): Payer: 59 | Admitting: Physical Therapy

## 2017-11-11 ENCOUNTER — Encounter: Payer: 59 | Admitting: Podiatry

## 2018-07-03 IMAGING — MR MR ANKLE*L* W/O CM
3 of 5 series · 9 of 40 positions shown · non-contrast
Comparison: Plain films of both feet 08/08/2015. Plain films left
foot 06/13/2017.

CLINICAL DATA: Onset of swelling of the left ankle and foot 3 weeks
ago after the patient heard a pop over the distal metatarsal area.
History of bunion repair 1 year ago.

EXAM:
MRI OF THE LEFT ANKLE WITHOUT CONTRAST
TECHNIQUE: Multiplanar, multisequence MR imaging of the ankle was performed. No
intravenous contrast was administered.

[Series 3: PD fat-sat · axial · left · 4.0mm · 0.20mm/px · z∈[-35,+60]mm · 3 of 27 slices shown]
[im 4/27]
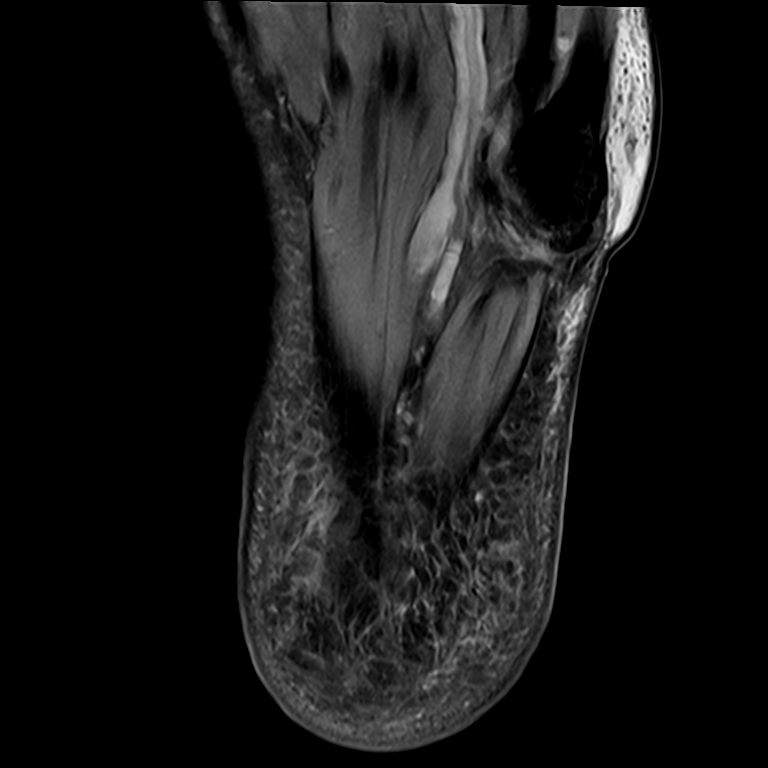
[im 15/27]
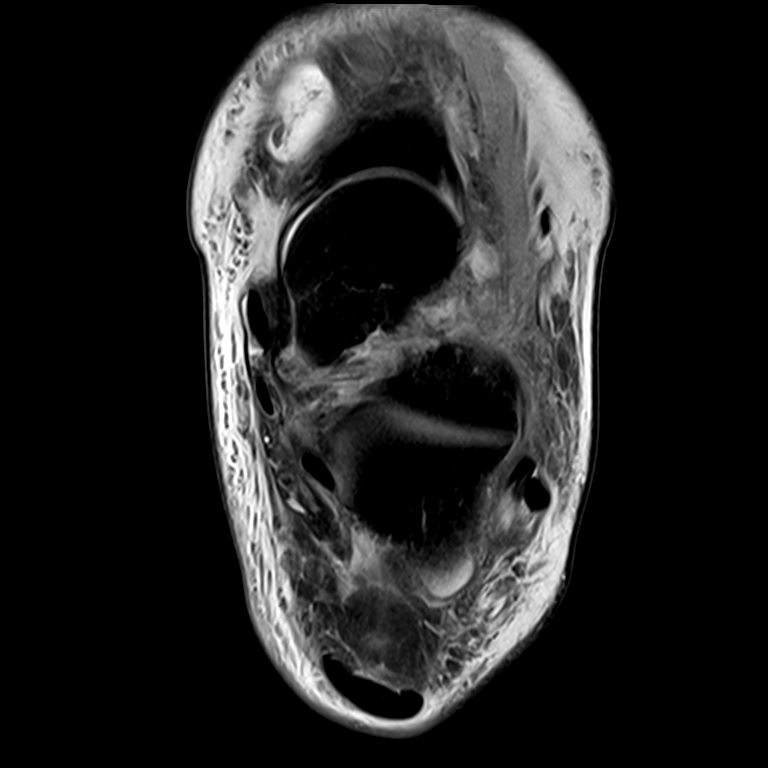
[im 23/27]
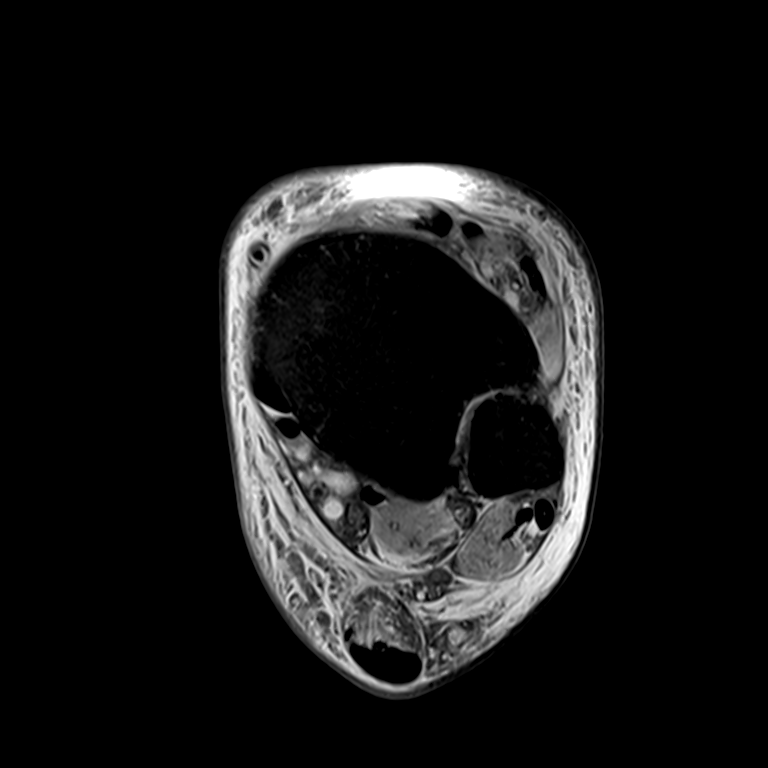

[Series 4: T2 fat-sat · axial · left · 4.0mm · 0.20mm/px · z∈[-30,+55]mm · 3 of 27 slices shown (1 of 2)]
[im 5/27]
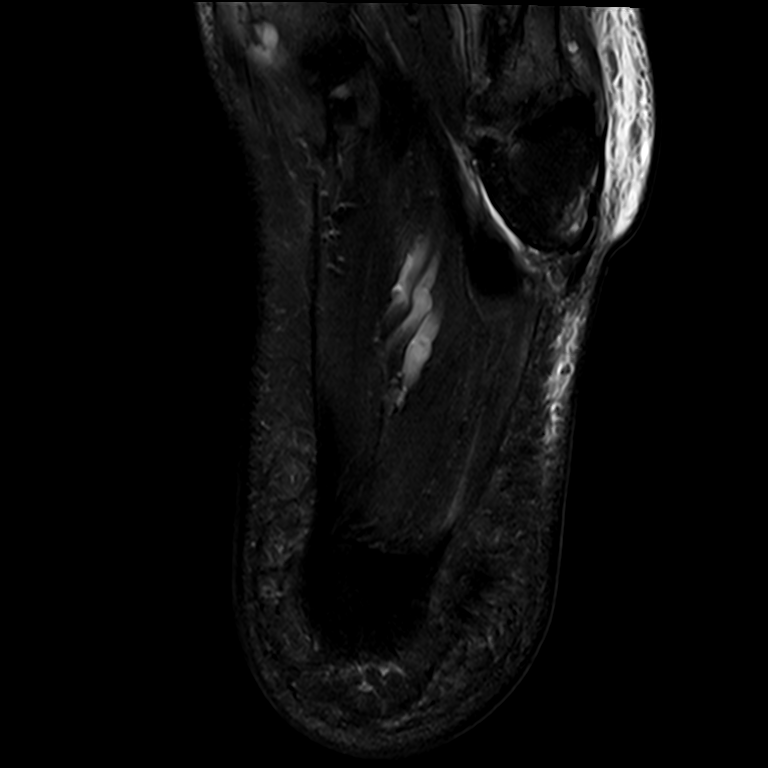
[im 14/27]
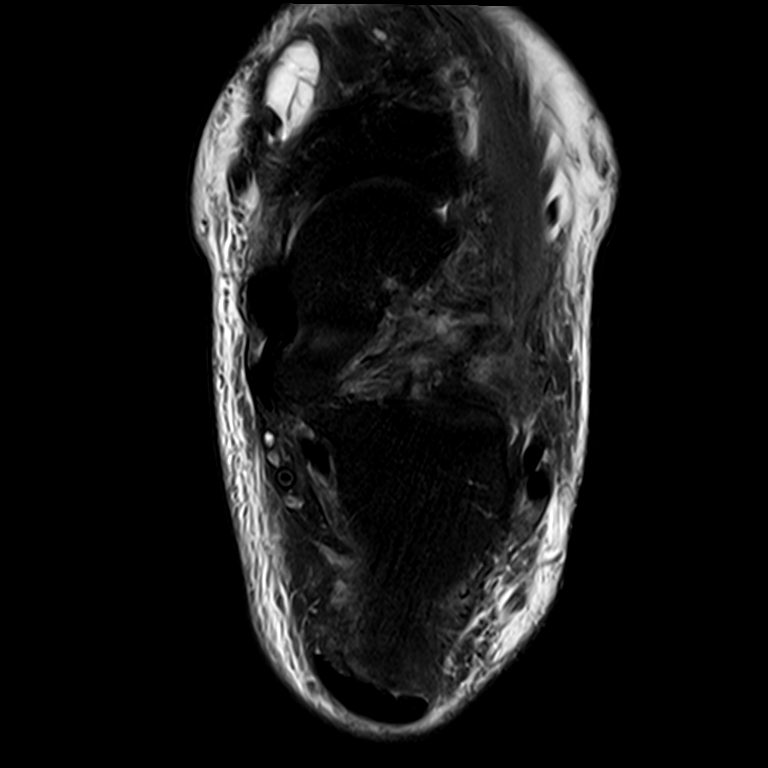
[im 22/27]
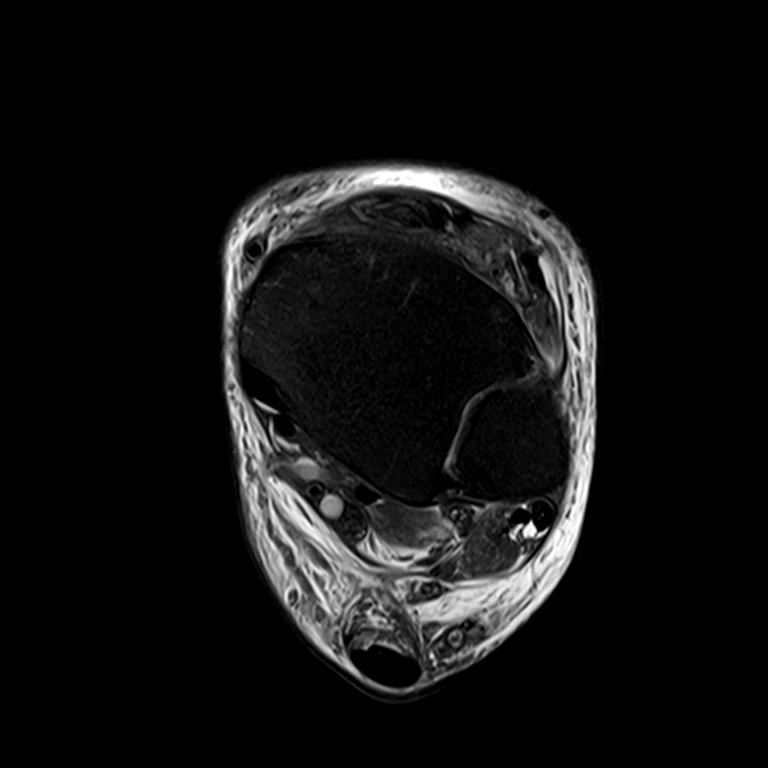

[Series 5: T2 fat-sat · sagittal · left · 2.5mm · 0.22mm/px · 3 of 31 slices shown (2 of 2)]
[im 5/31]
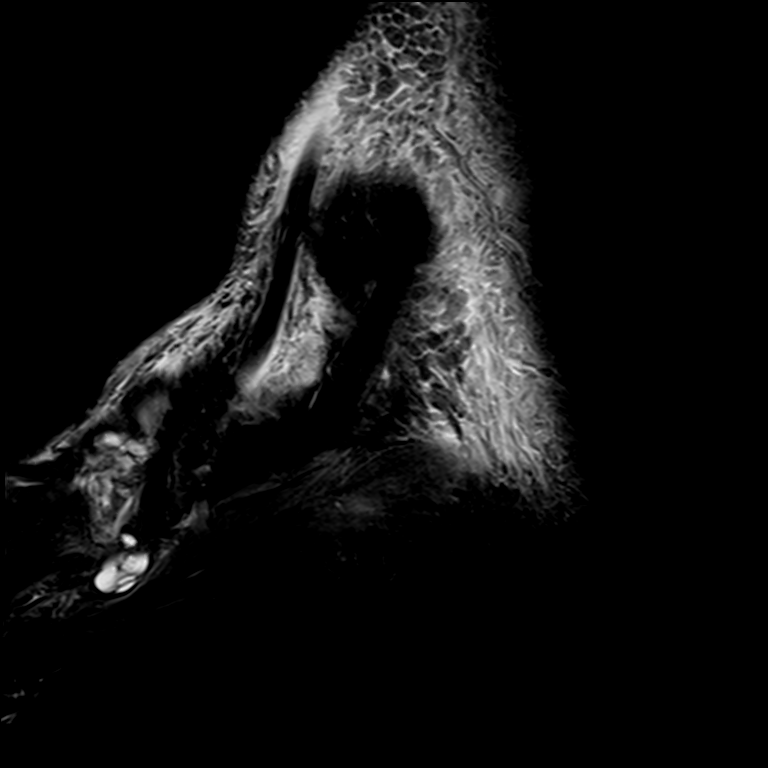
[im 18/31]
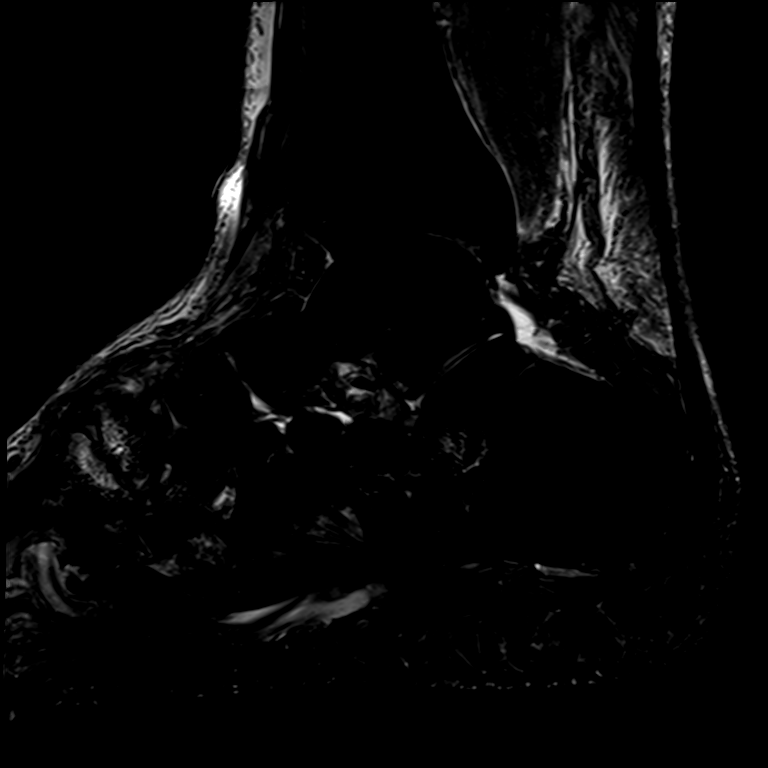
[im 26/31]
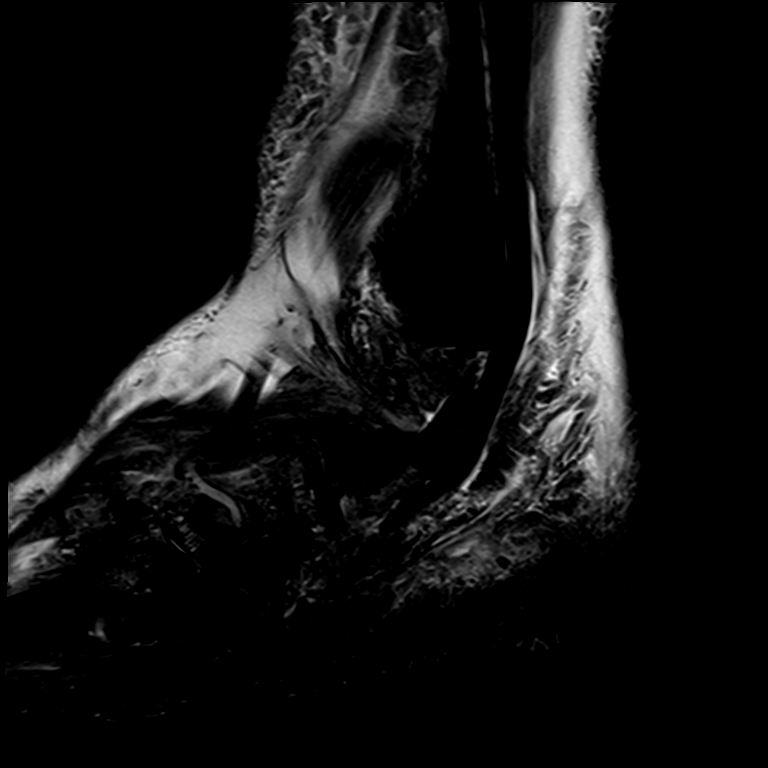

[9 of 40 positions shown; findings below may reference images not displayed]

FINDINGS: TENDONS

Peroneal: Intact.

Posteromedial: Intact.

Anterior: The tibialis anterior is completely torn and retracted to
the level of the navicular bone. A large volume of fluid is seen in
the more distal aspect of the tendon sheath. Anterior tendons are
otherwise intact.

Achilles: Intact.

Plantar Fascia: Intact.

LIGAMENTS

Lateral: Intact.

Medial: Intact.

CARTILAGE

Ankle Joint: Negative.

Subtalar Joints/Sinus Tarsi: Negative.

Bones: There is partial visualization of extensive subchondral edema
about the tarsometatarsal joints appearing severe at the second and
third.

Other: Subcutaneous edema is present about the ankle.
IMPRESSION: Complete tibialis anterior tear with retraction to the level of
navicular bone.

Severe midfoot osteoarthritis at the second and third
tarsometatarsal joints.

Subcutaneous edema about the ankle.

## 2019-01-11 ENCOUNTER — Other Ambulatory Visit: Payer: Self-pay

## 2019-01-11 DIAGNOSIS — Z20822 Contact with and (suspected) exposure to covid-19: Secondary | ICD-10-CM

## 2019-01-12 LAB — NOVEL CORONAVIRUS, NAA: SARS-CoV-2, NAA: NOT DETECTED

## 2019-01-15 ENCOUNTER — Telehealth: Payer: Self-pay | Admitting: General Practice

## 2019-01-15 NOTE — Telephone Encounter (Signed)
Gave patient negative covid tets results Patient understood

## 2019-02-22 ENCOUNTER — Ambulatory Visit (INDEPENDENT_AMBULATORY_CARE_PROVIDER_SITE_OTHER): Payer: 59

## 2019-02-22 ENCOUNTER — Ambulatory Visit: Payer: 59 | Admitting: Podiatry

## 2019-02-22 ENCOUNTER — Encounter: Payer: Self-pay | Admitting: Podiatry

## 2019-02-22 ENCOUNTER — Other Ambulatory Visit: Payer: Self-pay

## 2019-02-22 DIAGNOSIS — M792 Neuralgia and neuritis, unspecified: Secondary | ICD-10-CM | POA: Diagnosis not present

## 2019-02-22 DIAGNOSIS — M19072 Primary osteoarthritis, left ankle and foot: Secondary | ICD-10-CM

## 2019-02-22 DIAGNOSIS — M21619 Bunion of unspecified foot: Secondary | ICD-10-CM | POA: Diagnosis not present

## 2019-02-22 DIAGNOSIS — M7989 Other specified soft tissue disorders: Secondary | ICD-10-CM

## 2019-02-22 MED ORDER — EFINACONAZOLE 10 % EX SOLN: 1.0000 [drp] | Freq: Every day | CUTANEOUS | 11 refills | Status: DC

## 2019-02-22 NOTE — Patient Instructions (Signed)
Tarsal Tunnel Syndrome Rehab Ask your health care provider which exercises are safe for you. Do exercises exactly as told by your health care provider and adjust them as directed. It is normal to feel mild stretching, pulling, tightness, or discomfort as you do these exercises. Stop right away if you feel sudden pain or your pain gets worse. Do not begin these exercises until told by your health care provider. Stretching and range-of-motion exercises These exercises warm up your muscles and joints and improve the movement and flexibility of your foot. These exercises also help to relieve pain, numbness, and tingling. Gastrocnemius stretch, standing This exercise is also called a calf stretch. It stretches the muscles in the back of the lower leg (gastrocnemius). 1. Stand with your hands against a wall. 2. Extend your left / right leg behind you, and bend your front knee slightly. Your heels should be on the floor. 3. Keeping your heels on the floor and your back knee straight, shift your weight toward the wall. Do not arch your back. You should feel a gentle stretch in the back of your lower leg (calf). 4. Hold this position for __________ seconds. 5. Return to the starting position. Repeat __________ times. Complete this exercise __________ times a day. Tibial nerve glide 1. Sit on a stable chair with both feet on the floor. 2. Clasp your hands together behind your back. Gently round your back and tuck your chin toward your chest. 3. Slowly straighten your knee as far as you can without increasing your symptoms. 4. Turn your left / right foot so that your toes are pointing outward. 5. Slowly tip your toes toward your shin. 6. Hold this position for __________ seconds. 7. Slowly return to the starting position. Repeat __________ times. Complete this exercise __________ times a day. Strengthening exercises These exercises build strength and endurance in your foot. Endurance is the ability to use  your muscles for a long time, even after they get tired. Plantar flexion with band  1. Sit on the floor with your left / right leg extended. 2. Loop a rubber exercise band or tube around the ball of your left / right foot. The ball of your foot is on the walking surface, right under your toes. The band or tube should be slightly tense when your foot is relaxed. 3. Hold the two ends of the band or tube in your hands. 4. Slowly point your toes downward, pushing them away from you (plantar flexion). Stop pushing your toes down if you have any pain. 5. Hold this position for __________ seconds. 6. Let the band or tube slowly pull your foot back to the starting position. Repeat __________ times. Complete this exercise __________ times a day. Ankle inversion with band 1. Secure one end of an exercise band or tubing to a fixed object, such as a table leg or a pole, that will stay still when the band is pulled. 2. Secure the other end of the band around your left / right foot, near your toes. 3. Sit on the floor, facing the fixed object. The band should be slightly tense when your foot is relaxed. 4. Make fists with your hands and put them between your knees. This will focus your strengthening at your ankle. 5. Leading with your big toe, slowly pull your banded foot inward, toward your body (inversion). The band or tube should be adding resistance. 6. Hold this position for __________ seconds. 7. Let the band or tube slowly pull your foot  back to the starting position. Repeat __________ times. Complete this exercise __________ times a day. Arch lifts This exercise strengthens the main muscles of your foot (foot intrinsics). 1. Sit in a chair with your feet flat on the floor. 2. Keeping your big toe and your heel on the floor, lift only your arch, which is on the inner edge of your left / right foot. Do not move your knee or scrunch your toes. This is a small movement. 3. Hold this position for  __________ seconds. 4. Slowly return to the starting position. Repeat __________ times. Complete this exercise __________ times a day. This information is not intended to replace advice given to you by your health care provider. Make sure you discuss any questions you have with your health care provider. Document Released: 03/29/2005 Document Revised: 07/18/2018 Document Reviewed: 07/18/2018 Elsevier Patient Education  [place].

## 2019-02-23 NOTE — Progress Notes (Signed)
Subjective: 68 year old male presents the office today for evaluation of a couple issues to both feet with his left side worse than right.  He states he is having some numbness and occasional burning, stabbing to his left foot.  He states that he believes is more frequent on his feet all day especially if he has been using ladders.  Also he reports since having multiple surgeries on his foot he thinks that may be causing some of the issues.  Is not having significant discomfort and was have the area checked.  He also states that he has some discoloration to his toenails.  No females he denies any redness or drainage.  From a surgical standpoint he is doing well and is very happy with the results. Denies any systemic complaints such as fevers, chills, nausea, vomiting. No acute changes since last appointment, and no other complaints at this time.   Objective: AAO x3, NAD DP/PT pulses palpable bilaterally, CRT less than 3 seconds Nails are mildly hypertrophic, dystrophic with yellow and brown discoloration.  No significant pain of the nails there is no redness or drainage or ascending infection. Sensation is decreased bilaterally left side worse than right with Semmes Weinstein monofilament decreased vibratory sensation on the left side.  No back pain no radiating pain or weakness.  No recent falls. There is no area pinpoint tenderness identified bilaterally. No pain with calf compression, swelling, warmth, erythema  Assessment: 68 year old male left foot neuritis; onychomycosis  Plan: -All treatment options discussed with the patient including all alternatives, risks, complications. -X-rays obtained reviewed.  Arthritic changes present in midfoot.  Hammertoe contractures present.  Hardware intact. -We discussed treatment of the nerves.  Also hold off any medication try natural things first.  Also wants instruction exercises which we discussed.  Given a worksheet for tarsal tunnel rehab to see if this  will be helpful.  Also can use topical medication as needed. -Prescribe Jublia for nail fungus. -Patient encouraged to call the office with any questions, concerns, change in symptoms.   Trula Slade DPM

## 2019-02-24 ENCOUNTER — Other Ambulatory Visit: Payer: Self-pay | Admitting: Podiatry

## 2019-02-24 DIAGNOSIS — M19072 Primary osteoarthritis, left ankle and foot: Secondary | ICD-10-CM

## 2019-05-08 ENCOUNTER — Other Ambulatory Visit: Payer: Self-pay

## 2019-05-08 ENCOUNTER — Ambulatory Visit: Payer: 59 | Admitting: Podiatry

## 2019-05-08 ENCOUNTER — Encounter: Payer: Self-pay | Admitting: Podiatry

## 2019-05-08 DIAGNOSIS — G5752 Tarsal tunnel syndrome, left lower limb: Secondary | ICD-10-CM | POA: Diagnosis not present

## 2019-05-08 DIAGNOSIS — M779 Enthesopathy, unspecified: Secondary | ICD-10-CM | POA: Diagnosis not present

## 2019-05-08 DIAGNOSIS — M10071 Idiopathic gout, right ankle and foot: Secondary | ICD-10-CM

## 2019-05-08 DIAGNOSIS — D3610 Benign neoplasm of peripheral nerves and autonomic nervous system, unspecified: Secondary | ICD-10-CM

## 2019-05-08 MED ORDER — MELOXICAM 15 MG PO TABS: 15.0000 mg | ORAL_TABLET | Freq: Every day | ORAL | 0 refills | Status: DC

## 2019-05-08 NOTE — Progress Notes (Signed)
Subjective: 69 year old male presents the office today for concerns of bilateral foot pain as well as stabbing sensation.  He states that after he will still toed shoes he is on his feet all day he did notice swelling to his right big toe as well as to his left third and fourth toes he describes a stabbing sensation of the toes as well.  No radiating pain or weakness.  He is not sure if gout is starting. Denies any systemic complaints such as fevers, chills, nausea, vomiting. No acute changes since last appointment, and no other complaints at this time.   Objective: AAO x3, NAD DP/PT pulses palpable bilaterally, CRT less than 3 seconds At this time there is very minimal edema on the right hallux he states this previously was swollen red.  There is arthritic changes present of the IPJ.  Decreased MPJ range of motion on the right side.  On the left side there is tenderness on the third interspace.  No palpable neuromas identified but he does describe sharp nervelike sensations into the third and fourth toe.  No other areas of discomfort identified. No open lesions or pre-ulcerative lesions.  No pain with calf compression, swelling, warmth, erythema  Assessment: Concern for neuroma left third interspace, gout  Plan: -All treatment options discussed with the patient including all alternatives, risks, complications.  -We will check uric acid level.  This was ordered today. -Prescribed mobic. Discussed side effects of the medication and directed to stop if any are to occur and call the office.  -Steroid injection performed for the third interspace of the left foot.  The areas cleaned with alcohol and a mixture of 1 cc Kenalog 10, 0.5 cc of Marcaine plain, 0.5 cc of lidocaine plain was infiltrated into the third interspace without complications.  Postinjection care discussed.  Monitor for any signs or symptoms of infection. -He purchased over-the-counter inserts which are helpful.  I had Liliane Channel evaluate  them to see if he can modify them as well. -Patient encouraged to call the office with any questions, concerns, change in symptoms.   Trula Slade DPM

## 2019-05-24 ENCOUNTER — Other Ambulatory Visit: Payer: Self-pay | Admitting: Urology

## 2019-06-05 ENCOUNTER — Ambulatory Visit: Payer: 59 | Admitting: Podiatry

## 2019-06-11 ENCOUNTER — Telehealth: Payer: Self-pay | Admitting: *Deleted

## 2019-06-11 NOTE — Telephone Encounter (Signed)
Got a refill request from [facility] for Meloxicam and I called and left a message for the patient to call me back-I was wanting to know if the patient is feeling better or does the patient need another refill and to call the [place] office at [phone]. Lattie Haw

## 2019-06-15 NOTE — Patient Instructions (Signed)
DUE TO COVID-19 ONLY ONE VISITOR IS ALLOWED TO COME WITH YOU AND STAY IN THE WAITING ROOM ONLY DURING PRE OP AND PROCEDURE DAY OF SURGERY. THE 1 VISITOR MAY VISIT WITH YOU AFTER SURGERY IN YOUR PRIVATE ROOM DURING VISITING HOURS ONLY!  YOU NEED TO HAVE A COVID 19 TEST ON__3/8/21_____ @_1 :55______, THIS TEST MUST BE DONE BEFORE SURGERY, COME  Darren Gay , [id number].  ([place]) ONCE YOUR COVID TEST IS COMPLETED, PLEASE BEGIN THE QUARANTINE INSTRUCTIONS AS OUTLINED IN YOUR HANDOUT.                Darren Gay    Your procedure is scheduled on: 06/21/19   Report to [facility]  [facility]   Report to Short Stay at 5:30 AM     Call this number if you have problems the morning of surgery [phone]    Remember: Do not eat food or drink liquids :After Midnight.   BRUSH YOUR TEETH MORNING OF SURGERY AND RINSE YOUR MOUTH OUT, NO CHEWING GUM CANDY OR MINTS.     Take these medicines the morning of surgery with A SIP OF WATER: Wellbutrin                                You may not have any metal on your body including              piercings  Do not wear jewelry,  lotions, powders or deodorant                       Men may shave face and neck.   Do not bring valuables to the hospital. Darren Gay.  Contacts, dentures or bridgework may not be worn into surgery.      Name and phone number of your driver:  Special Instructions: N/A              Please read over the following fact sheets you were given: _____________________________________________________________________             [facility] - Preparing for Surgery  Before surgery, you can play an important role.   Because skin is not sterile, your skin needs to be as free of germs as possible .  You can reduce the number of germs on your skin by washing with CHG (chlorahexidine gluconate) soap before surgery.   CHG is an antiseptic  cleaner which kills germs and bonds with the skin to continue killing germs even after washing. Please DO NOT use if you have an allergy to CHG or antibacterial soaps .  If your skin becomes reddened/irritated stop using the CHG and inform your nurse when you arrive at Short Stay.  You may shave your face/neck.  Please follow these instructions carefully:  1.  Shower with CHG Soap the night before surgery and the  morning of Surgery.  2.  If you choose to wash your hair, wash your hair first as usual with your  normal  shampoo.  3.  After you shampoo, rinse your hair and body thoroughly to remove the  shampoo.  4.  Use CHG as you would any other liquid soap.  You can apply chg directly  to the skin and wash                       Gently with a scrungie or clean washcloth.  5.  Apply the CHG Soap to your body ONLY FROM THE NECK DOWN.   Do not use on face/ open                           Wound or open sores. Avoid contact with eyes, ears mouth and genitals (private parts).                       Wash face,  Genitals (private parts) with your normal soap.             6.  Wash thoroughly, paying special attention to the area where your surgery  will be performed.  7.  Thoroughly rinse your body with warm water from the neck down.  8.  DO NOT shower/wash with your normal soap after using and rinsing off  the CHG Soap.             9.  Pat yourself dry with a clean towel.            10.  Wear clean pajamas.            11.  Place clean sheets on your bed the night of your first shower and do not  sleep with pets. Day of Surgery : Do not apply any lotions/deodorants the morning of surgery.  Please wear clean clothes to the hospital/surgery center.  FAILURE TO FOLLOW THESE INSTRUCTIONS MAY RESULT IN THE CANCELLATION OF YOUR SURGERY PATIENT SIGNATURE_________________________________  NURSE  SIGNATURE__________________________________  ________________________________________________________________________   Darren Gay  An incentive spirometer is a tool that can help keep your lungs clear and active. This tool measures how well you are filling your lungs with each breath. Taking long deep breaths may help reverse or decrease the chance of developing breathing (pulmonary) problems (especially infection) following:  A long period of time when you are unable to move or be active. BEFORE THE PROCEDURE   If the spirometer includes an indicator to show your best effort, your nurse or respiratory therapist will set it to a desired goal.  If possible, sit up straight or lean slightly forward. Try not to slouch.  Hold the incentive spirometer in an upright position. INSTRUCTIONS FOR USE  1. Sit on the edge of your bed if possible, or sit up as far as you can in bed or on a chair. 2. Hold the incentive spirometer in an upright position. 3. Breathe out normally. 4. Place the mouthpiece in your mouth and seal your lips tightly around it. 5. Breathe in slowly and as deeply as possible, raising the piston or the ball toward the top of the column. 6. Hold your breath for 3-5 seconds or for as long as possible. Allow the piston or ball to fall to the bottom of the column. 7. Remove the mouthpiece from your mouth and breathe out normally. 8. Rest for a few seconds and repeat Steps 1 through 7 at least 10 times every 1-2 hours when you are awake. Take your time and take a few normal breaths between deep breaths. 9. The spirometer may include an indicator to show your best effort.  Use the indicator as a goal to work toward during each repetition. 10. After each set of 10 deep breaths, practice coughing to be sure your lungs are clear. If you have an incision (the cut made at the time of surgery), support your incision when coughing by placing a pillow or rolled up towels firmly  against it. Once you are able to get out of bed, walk around indoors and cough well. You may stop using the incentive spirometer when instructed by your caregiver.  RISKS AND COMPLICATIONS  Take your time so you do not get dizzy or light-headed.  If you are in pain, you may need to take or ask for pain medication before doing incentive spirometry. It is harder to take a deep breath if you are having pain. AFTER USE  Rest and breathe slowly and easily.  It can be helpful to keep track of a log of your progress. Your caregiver can provide you with a simple table to help with this. If you are using the spirometer at home, follow these instructions: [place] IF:   You are having difficultly using the spirometer.  You have trouble using the spirometer as often as instructed.  Your pain medication is not giving enough relief while using the spirometer.  You develop fever of 100.5 F (38.1 C) or higher. SEEK IMMEDIATE MEDICAL CARE IF:   You cough up bloody sputum that had not been present before.  You develop fever of 102 F (38.9 C) or greater.  You develop worsening pain at or near the incision site. MAKE SURE YOU:   Understand these instructions.  Will watch your condition.  Will get help right away if you are not doing well or get worse. Document Released: 08/09/2006 Document Revised: 06/21/2011 Document Reviewed: 10/10/2006 Exit[employer] Patient Information 2014 Exit[employer], [place].   ________________________________________________________________________  WHAT IS A BLOOD TRANSFUSION? Blood Transfusion Information  A transfusion is the replacement of blood or some of its parts. Blood is made up of multiple cells which provide different functions.  Red blood cells carry oxygen and are used for blood loss replacement.  White blood cells fight against infection.  Platelets control bleeding.  Plasma helps clot blood.  Other blood products are available for  specialized needs, such as hemophilia or other clotting disorders. BEFORE THE TRANSFUSION  Who gives blood for transfusions?   Healthy volunteers who are fully evaluated to make sure their blood is safe. This is blood bank blood. Transfusion therapy is the safest it has ever been in the practice of medicine. Before blood is taken from a donor, a complete history is taken to make sure that person has no history of diseases nor engages in risky social behavior (examples are intravenous drug use or sexual activity with multiple partners). The donor's travel history is screened to minimize risk of transmitting infections, such as malaria. The donated blood is tested for signs of infectious diseases, such as HIV and hepatitis. The blood is then tested to be sure it is compatible with you in order to minimize the chance of a transfusion reaction. If you or a relative donates blood, this is often done in anticipation of surgery and is not appropriate for emergency situations. It takes many days to process the donated blood. RISKS AND COMPLICATIONS Although transfusion therapy is very safe and saves many lives, the main dangers of transfusion include:   Getting an infectious disease.  Developing a transfusion reaction. This is an allergic reaction to something in the  blood you were given. Every precaution is taken to prevent this. The decision to have a blood transfusion has been considered carefully by your caregiver before blood is given. Blood is not given unless the benefits outweigh the risks. AFTER THE TRANSFUSION  Right after receiving a blood transfusion, you will usually feel much better and more energetic. This is especially true if your red blood cells have gotten low (anemic). The transfusion raises the level of the red blood cells which carry oxygen, and this usually causes an energy increase.  The nurse administering the transfusion will monitor you carefully for complications. HOME CARE  INSTRUCTIONS  No special instructions are needed after a transfusion. You may find your energy is better. Speak with your caregiver about any limitations on activity for underlying diseases you may have. SEEK MEDICAL CARE IF:   Your condition is not improving after your transfusion.  You develop redness or irritation at the intravenous (IV) site. SEEK IMMEDIATE MEDICAL CARE IF:  Any of the following symptoms occur over the next 12 hours:  Shaking chills.  You have a temperature by mouth above 102 F (38.9 C), not controlled by medicine.  Chest, back, or muscle pain.  People around you feel you are not acting correctly or are confused.  Shortness of breath or difficulty breathing.  Dizziness and fainting.  You get a rash or develop hives.  You have a decrease in urine output.  Your urine turns a dark color or changes to pink, red, or brown. Any of the following symptoms occur over the next 10 days:  You have a temperature by mouth above 102 F (38.9 C), not controlled by medicine.  Shortness of breath.  Weakness after normal activity.  The white part of the eye turns yellow (jaundice).  You have a decrease in the amount of urine or are urinating less often.  Your urine turns a dark color or changes to pink, red, or brown. Document Released: 03/26/2000 Document Revised: 06/21/2011 Document Reviewed: 11/13/2007 [facility] Patient Information 2014 [place], [place].  _______________________________________________________________________

## 2019-06-18 ENCOUNTER — Other Ambulatory Visit (HOSPITAL_COMMUNITY)
Admission: RE | Admit: 2019-06-18 | Discharge: 2019-06-18 | Disposition: A | Discharge status: Home / Self Care | Payer: 59 | Source: Ambulatory Visit | Attending: Urology | Admitting: Urology

## 2019-06-18 ENCOUNTER — Encounter (HOSPITAL_COMMUNITY): Payer: Self-pay

## 2019-06-18 ENCOUNTER — Encounter (HOSPITAL_COMMUNITY)
Admission: RE | Admit: 2019-06-18 | Discharge: 2019-06-18 | Disposition: A | Discharge status: Home / Self Care | Payer: 59 | Source: Ambulatory Visit | Attending: Urology | Admitting: Urology

## 2019-06-18 ENCOUNTER — Other Ambulatory Visit: Payer: Self-pay

## 2019-06-18 DIAGNOSIS — Z20822 Contact with and (suspected) exposure to covid-19: Secondary | ICD-10-CM | POA: Diagnosis not present

## 2019-06-18 DIAGNOSIS — R9431 Abnormal electrocardiogram [ECG] [EKG]: Secondary | ICD-10-CM | POA: Diagnosis not present

## 2019-06-18 DIAGNOSIS — Z01812 Encounter for preprocedural laboratory examination: Secondary | ICD-10-CM | POA: Diagnosis present

## 2019-06-18 LAB — BASIC METABOLIC PANEL
Anion gap: 7 (ref 5–15)
BUN: 19 mg/dL (ref 8–23)
CO2: 28 mmol/L (ref 22–32)
Calcium: 9.4 mg/dL (ref 8.9–10.3)
Chloride: 106 mmol/L (ref 98–111)
Creatinine, Ser: 0.99 mg/dL (ref 0.61–1.24)
GFR calc Af Amer: >60 mL/min (ref >60)
GFR calc non Af Amer: >60 mL/min (ref >60)
Glucose, Bld: 144 mg/dL — ABNORMAL HIGH (ref 70–99)
Potassium: 4.4 mmol/L (ref 3.5–5.1)
Sodium: 141 mmol/L (ref 135–145)

## 2019-06-18 LAB — CBC
HCT: 41.8 % (ref 39.0–52.0)
Hemoglobin: 14.2 g/dL (ref 13.0–17.0)
MCH: 31.2 pg (ref 26.0–34.0)
MCHC: 34 g/dL (ref 30.0–36.0)
MCV: 91.9 fL (ref 80.0–100.0)
Platelets: 208 10*3/uL (ref 150–400)
RBC: 4.55 MIL/uL (ref 4.22–5.81)
RDW: 13 % (ref 11.5–15.5)
WBC: 6.3 10*3/uL (ref 4.0–10.5)
nRBC: 0 % (ref 0.0–0.2)

## 2019-06-18 LAB — SARS CORONAVIRUS 2 (TAT 6-24 HRS): SARS Coronavirus 2: NEGATIVE

## 2019-06-18 LAB — ABO/RH: ABO/RH(D): A POS

## 2019-06-18 NOTE — Progress Notes (Signed)
PCP - Dr. Lenetta Quaker Cardiologist - none  Chest x-ray - no EKG - 06/18/19 Stress Test - no ECHO - no Cardiac Cath - no  Sleep Study - yes CPAP - yes  Fasting Blood Sugar -NA  Checks Blood Sugar _____ times a day  Blood Thinner Instructions:ASA. Pt takes as a preventative Aspirin Instructions:Dr. Alinda Money said to stop 5 days prior to DOS. Last Dose:2/27  Anesthesia review:   Patient denies shortness of breath, fever, cough and chest pain at PAT appointment  yes Patient verbalized understanding of instructions that were given to them at the PAT appointment. Patient was also instructed that they will need to review over the PAT instructions again at home before surgery. yes

## 2019-06-20 NOTE — H&P (Signed)
Prostate Cancer    [name] is a 69 year old gentleman with a history of testosterone deficiency on testosterone replacement therapy who was noted to have an elevated PSA of 7.1. This prompted a TRUS biopsy of the prostate on 04/02/19 that confirmed Gleason 4+3=7 adenocarcinoma with 6 out of 12 biopsy cores positive for malignancy.   He has a history of testosterone deficiency in previously had been under treatment with testosterone replacement therapy per his primary care physician. He states that his symptoms were decreased energy, decreased libido, and decreased muscle mass. He has been off testosterone replacement therapy since his prostate cancer diagnosis. This has not been a major issue with regard to his quality of life overall.   Family history: None.   Imaging studies:  CT abd/pelvis (04/23/19): Negative for malignancy.   PMH: He has a history of gout, hypertension, depression, GERD, sleep apnea, and testosterone deficiency.  PSH: No abdominal surgeries.   TNM stage: cT1c N0 Mx  PSA: 7.1  Gleason score: 4+3=7  Biopsy (04/02/19): 6/12 cores positive  Right: R apex (40%, 3+3=6), R lateral apex (20%, 3+4=7, PNI), R mid (60%, 4+3=7, PNI), R lateral mid (70%, 3+4=7), R base (5%, 3+4=7, PNI), R lateral base (10%, 4+3=7)  Left: Benign  Prostate volume: 70.2 cc   Nomogram  OC disease: 36%  EPE: 60%  SVI: 13%  LNI: 12%  PFS (5 year, 10 year): 60%, 44%   Urinary function: IPSS is 6.  Erectile function: SHIM score is 3. He has not been sexually active recently. This is at least in part related to his wife's history of prolapse status post surgery. She does have ongoing dyspareunia. He takes Cialis when he does make attempts at erections. He has a history of Peyronie's disease s/p Xiaflex treatment. Penile curvature is no longer a major issue for him.     ALLERGIES: No Allergies    MEDICATIONS: Allopurinol 100 mg tablet  Amlodipine-Valsartan-Hctz 10 mg-320 mg-25 mg tablet Oral   Aspirin Ec 81 mg tablet, delayed release Oral  BuPROPion HCl ER (SR) 150 MG Oral Tablet Extended Release 12 Hour Oral  Cialis 20 mg tablet 0 Oral  Exforge Hct 10 mg-320 mg-25 mg tablet  Fish Oil Omega-3 300 mg-1,000 mg capsule Oral  Flaxseed Oil  Multi-Day tablet Oral  Probiotic  Vascepa 1 gram capsule  Vitamin D3 75 mcg (3,000 unit) tablet Oral  Wellbutrin Sr 150 mg tablet,sustained-release 12 hr     GU PSH: Locm 300-399Mg /Ml Iodine,1Ml - 04/23/2019 Prostate Needle Biopsy - 04/02/2019 Vasectomy - 2014       PSH Notes: Tonsillectomy, Surgery Of Male Genitalia Vasectomy, Hand Surgery, Arthroscopy Knee Right   NON-GU PSH: Knee Arthroscopy; Dx - 2014 Remove Tonsils - 2014 Surgical Pathology, Gross And Microscopic Examination For Prostate Needle - 04/02/2019     GU PMH: Prostate Cancer - 04/17/2019 Elevated PSA - 04/02/2019, (Stable), His PSA appears to have been elevated secondary to the effects of exogenous testosterone. Now with him on 2 pumps his PSA has remained normal. I have recommended annual DRE and PSA from here out. He indicated that he would prefer that I do that and therefore he will be scheduled follow-up in 1 year., - 03/21/2018, He does have an enlarged prostate which certainly can cause some PSA elevation. In addition we discussed the fact that testosterone replacement can cause prostate cancer to grow if present but does not cause prostate cancer. Because, by his account, his PSA did decrease with a decreasing  dose of testosterone it sounds as if this is a benign elevation secondary to the testosterone. He said his PSAs have been followed although only the latest PSA was supplied so I will obtain his previous PSAs for review. Did not want to proceed with a biopsy but has agreed to close follow-up with a repeat PSA again in 3 months and DRE and PSA again in, - 10/18/2017 Prostate nodule w/o LUTS, Right - 03/22/2019 Epididymitis, Left, There appears to be induration associated  with the tail of the left epididymis. Since he just had an infection treated with antibiotics I told him that this is likely inflammatory and will then resolved spontaneously and if it does not further evaluation with an ultrasound will be indicated. He is due to see me in about 5 so I will re-evaluate him at that time. - 02/08/2019 ED, disease classified elsewhere, Erectile disorder due to medical condition in male - 2016 Peyronies Disease, Peyronie's disease - 2015 Primary hypogonadism, Hypogonadism, testicular - 2014      PMH Notes: Peyronie's disease with partial ED: He has reported developing a 45 curvature of his penis upward. This began developing in mid 2013. He has no pain with erections and is still able to achieve an erection however the distal aspect of the penis does not seem to be getting as rigid as the proximal shaft. He has used Cialis at a 10 mg dose for this. The amount of curvature has remained stable. He believes there may have been a mild buckling injury that occurred but he is not sure. Neither he nor any family members have a history of Dupuytren's contracture.  Treatment: Xiaflex - 3 courses with improvement in curvature.   Erectile dysfunction: He reports still being able to achieve an erection but requires supplementation with a phosphodiesterase inhibitor. He initially was using sildenafil.  Current therapy: Cialis 20 mg   As far as his voiding goes he indicates that he has noted some slowing of his urinary stream over the years.      NON-GU PMH: Encounter for general adult medical examination without abnormal findings, Encounter for preventive health examination - 2016 Personal history of other diseases of the circulatory system, History of hypertension - 2014 Personal history of other diseases of the digestive system, History of gastroesophageal reflux (GERD) - 2014 Personal history of other diseases of the nervous system and sense organs, History of sleep apnea -  2014 Personal history of other mental and behavioral disorders, History of depression - 2014    FAMILY HISTORY: Additional heart attack (anterior wall) - Runs In Family malignant neoplasm of breast - Runs In Family   SOCIAL HISTORY: Marital Status: Married     Notes: Former smoker, Caffeine use, Alcohol use, Married   REVIEW OF SYSTEMS:    GU Review Male:   Patient denies frequent urination, hard to postpone urination, burning/ pain with urination, get up at night to urinate, leakage of urine, stream starts and stops, trouble starting your streams, and have to strain to urinate .  Gastrointestinal (Lower):   Patient denies diarrhea and constipation.  Gastrointestinal (Upper):   Patient denies nausea and vomiting.  Constitutional:   Patient denies fever, night sweats, weight loss, and fatigue.  Skin:   Patient denies skin rash/ lesion and itching.  Eyes:   Patient denies blurred vision and double vision.  Ears/ Nose/ Throat:   Patient denies sore throat and sinus problems.  Hematologic/Lymphatic:   Patient denies swollen glands and easy bruising.  Cardiovascular:   Patient denies leg swelling and chest pains.  Respiratory:   Patient denies cough and shortness of breath.  Endocrine:   Patient denies excessive thirst.  Musculoskeletal:   Patient denies back pain and joint pain.  Neurological:   Patient denies headaches and dizziness.  Psychologic:   Patient denies depression and anxiety.   VITAL SIGNS:     Weight 214 lb / 97.07 kg  Height 72 in / 182.88 cm  BMI 29.0 kg/m   MULTI-SYSTEM PHYSICAL EXAMINATION:    Constitutional: Well-nourished. No physical deformities. Normally developed. Good grooming.  Neck: Neck symmetrical, not swollen. Normal tracheal position.  Respiratory: No labored breathing, no use of accessory muscles. Clear bilaterally.  Cardiovascular: Normal temperature, normal extremity pulses, no swelling, no varicosities. Regular rate and rhythm.  Lymphatic: No  enlargement of neck, axillae, groin.  Skin: No paleness, no jaundice, no cyanosis. No lesion, no ulcer, no rash.  Neurologic / Psychiatric: Oriented to time, oriented to place, oriented to person. No depression, no anxiety, no agitation.  Gastrointestinal: No mass, no tenderness, no rigidity, non obese abdomen.  Eyes: Normal conjunctivae. Normal eyelids.  Ears, Nose, Mouth, and Throat: Left ear no scars, no lesions, no masses. Right ear no scars, no lesions, no masses. Nose no scars, no lesions, no masses. Normal hearing. Normal lips.  Musculoskeletal: Normal gait and station of head and neck.     PAST DATA REVIEWED:  Source Of History:  Patient  Lab Test Review:   PSA  Records Review:   Pathology Reports, Previous Patient Records  Urine Test Review:   Urinalysis  X-Ray Review: C.T. Abdomen/Pelvis: Reviewed Films.     03/16/19 03/17/18 01/19/18 09/16/17 03/15/17 02/28/17  PSA  Total PSA 7.10 ng/mL 3.81 ng/mL 3.82 ng/mL 4.54 ng/dl 5.9 ng/dl 5.0 ng/dl  Free PSA 0.54 ng/mL       % Free PSA 8 % PSA        Notes:                     CT ABDOMEN AND PELVIS WITH CONTRAST 04/23/2019   --------------------------------------------------------------------------------   Roxan Hockey  MRN: [id number] Ordering Provider: Kathie Rhodes  DOB: [birth year] Date Collected: 04/23/2019 08:00:00  SSN: [id number] Date Completed: 04/23/2019 14:38:03   --------------------------------------------------------------------------------   CLINICAL DATA: Newly diagnosed prostate carcinoma. Staging.   EXAM:  CT ABDOMEN AND PELVIS WITH CONTRAST   TECHNIQUE:  Multidetector CT imaging of the abdomen and pelvis was performed  using the standard protocol following bolus administration of  intravenous contrast.   CONTRAST: 100 mL Omnipaque 300   COMPARISON: Chest CT on 02/18/2009 from [place]:  Lower Chest: No acute findings. Multiple sub-cm pulmonary nodules in  both lung bases are stable,  consistent with benign etiology.   Hepatobiliary: No hepatic masses identified. Mild-to-moderate  diffuse hepatic steatosis again noted. Gallbladder is unremarkable.  No evidence of biliary ductal dilatation.   Pancreas: No mass or inflammatory changes.   Spleen: Within normal limits in size and appearance.   Adrenals/Urinary Tract: No masses identified. No evidence of  hydronephrosis.   Stomach/Bowel: No evidence of obstruction, inflammatory process or  abnormal fluid collections.   Vascular/Lymphatic: Sub-cm lymph nodes are seen in both iliac lymph  node chains in the pelvis, however no pathologically enlarged lymph  nodes are identified. No abdominal aortic aneurysm. Aortic  atherosclerosis incidentally noted.   Reproductive: Mildly enlarged prostate gland.   Other: None.   Musculoskeletal: No suspicious bone  lesions identified. Bilateral L5  pars defects are seen with severe degenerative disc disease and  grade 2 anterolisthesis at L5-S1.   IMPRESSION:  1. Nonspecific sub-cm bilateral iliac lymph nodes noted. No definite  evidence of metastatic disease.  2. Mildly enlarged prostate.  3. Hepatic steatosis.  4. Bilateral L5 pars defects, with severe degenerative disc disease  and grade 2 anterolisthesis at L5-S1.    Electronically Signed  By: Marlaine Hind M.D.  On: 04/23/2019 14:35       ASSESSMENT:      ICD-10 Details  1 GU:   Prostate Cancer - C61    PLAN:          1. Unfavorable intermediate risk prostate cancer: He has elected to proceed with surgical therapy. He will be scheduled for a unilateral nerve-sparing left robot assisted laparoscopic radical prostatectomy and bilateral pelvic lymphadenectomy.

## 2019-06-20 NOTE — Anesthesia Preprocedure Evaluation (Addendum)
Anesthesia Evaluation  Patient identified by MRN, date of birth, ID band Patient awake    Reviewed: Allergy & Precautions, NPO status , Patient's Chart, lab work & pertinent test results  Airway Mallampati: III  TM Distance: >3 FB Neck ROM: Full  Mouth opening: Limited Mouth Opening  Dental no notable dental hx.    Pulmonary sleep apnea , former smoker,    Pulmonary exam normal breath sounds clear to auscultation       Cardiovascular hypertension, Normal cardiovascular exam Rhythm:Regular Rate:Normal     Neuro/Psych negative neurological ROS  negative psych ROS   GI/Hepatic negative GI ROS, (+)     substance abuse  alcohol use,   Endo/Other  negative endocrine ROS  Renal/GU negative Renal ROS  negative genitourinary   Musculoskeletal negative musculoskeletal ROS (+)   Abdominal   Peds negative pediatric ROS (+)  Hematology negative hematology ROS (+)   Anesthesia Other Findings   Reproductive/Obstetrics negative OB ROS                            Anesthesia Physical Anesthesia Plan  ASA: III  Anesthesia Plan: General   Post-op Pain Management:    Induction: Intravenous  PONV Risk Score and Plan: 2 and Ondansetron, Dexamethasone and Treatment may vary due to age or medical condition  Airway Management Planned: Oral ETT and Video Laryngoscope Planned  Additional Equipment:   Intra-op Plan:   Post-operative Plan: Extubation in OR  Informed Consent: I have reviewed the patients History and Physical, chart, labs and discussed the procedure including the risks, benefits and alternatives for the proposed anesthesia with the patient or authorized representative who has indicated his/her understanding and acceptance.     Dental advisory given  Plan Discussed with: CRNA and Surgeon  Anesthesia Plan Comments:        Anesthesia Quick Evaluation

## 2019-06-21 ENCOUNTER — Other Ambulatory Visit: Payer: Self-pay

## 2019-06-21 ENCOUNTER — Observation Stay (HOSPITAL_COMMUNITY)
Admission: RE | Admit: 2019-06-21 | Discharge: 2019-06-22 | Disposition: A | Discharge status: Home / Self Care | Payer: 59 | Attending: Urology | Admitting: Urology

## 2019-06-21 ENCOUNTER — Encounter (HOSPITAL_COMMUNITY): Payer: Self-pay | Admitting: Urology

## 2019-06-21 ENCOUNTER — Encounter (HOSPITAL_COMMUNITY)
Admission: RE | Disposition: A | Discharge status: Home / Self Care | Payer: Self-pay | Source: Home / Self Care | Attending: Urology

## 2019-06-21 ENCOUNTER — Ambulatory Visit (HOSPITAL_COMMUNITY): Payer: 59 | Admitting: Anesthesiology

## 2019-06-21 DIAGNOSIS — Z803 Family history of malignant neoplasm of breast: Secondary | ICD-10-CM | POA: Insufficient documentation

## 2019-06-21 DIAGNOSIS — Z9852 Vasectomy status: Secondary | ICD-10-CM | POA: Insufficient documentation

## 2019-06-21 DIAGNOSIS — M4316 Spondylolisthesis, lumbar region: Secondary | ICD-10-CM | POA: Diagnosis not present

## 2019-06-21 DIAGNOSIS — M5136 Other intervertebral disc degeneration, lumbar region: Secondary | ICD-10-CM | POA: Insufficient documentation

## 2019-06-21 DIAGNOSIS — Z7982 Long term (current) use of aspirin: Secondary | ICD-10-CM | POA: Diagnosis not present

## 2019-06-21 DIAGNOSIS — C61 Malignant neoplasm of prostate: Principal | ICD-10-CM | POA: Diagnosis present

## 2019-06-21 DIAGNOSIS — K760 Fatty (change of) liver, not elsewhere classified: Secondary | ICD-10-CM | POA: Insufficient documentation

## 2019-06-21 DIAGNOSIS — M5137 Other intervertebral disc degeneration, lumbosacral region: Secondary | ICD-10-CM | POA: Diagnosis not present

## 2019-06-21 DIAGNOSIS — Z79899 Other long term (current) drug therapy: Secondary | ICD-10-CM | POA: Diagnosis not present

## 2019-06-21 DIAGNOSIS — F329 Major depressive disorder, single episode, unspecified: Secondary | ICD-10-CM | POA: Diagnosis not present

## 2019-06-21 DIAGNOSIS — N400 Enlarged prostate without lower urinary tract symptoms: Secondary | ICD-10-CM | POA: Insufficient documentation

## 2019-06-21 DIAGNOSIS — Z8249 Family history of ischemic heart disease and other diseases of the circulatory system: Secondary | ICD-10-CM | POA: Insufficient documentation

## 2019-06-21 DIAGNOSIS — Z87891 Personal history of nicotine dependence: Secondary | ICD-10-CM | POA: Insufficient documentation

## 2019-06-21 DIAGNOSIS — I10 Essential (primary) hypertension: Secondary | ICD-10-CM | POA: Insufficient documentation

## 2019-06-21 DIAGNOSIS — K219 Gastro-esophageal reflux disease without esophagitis: Secondary | ICD-10-CM | POA: Diagnosis not present

## 2019-06-21 DIAGNOSIS — G4730 Sleep apnea, unspecified: Secondary | ICD-10-CM | POA: Insufficient documentation

## 2019-06-21 HISTORY — PX: LYMPHADENECTOMY: SHX5960

## 2019-06-21 HISTORY — PX: ROBOT ASSISTED LAPAROSCOPIC RADICAL PROSTATECTOMY: SHX5141

## 2019-06-21 LAB — TYPE AND SCREEN
ABO/RH(D): A POS
Antibody Screen: NEGATIVE

## 2019-06-21 LAB — HEMOGLOBIN AND HEMATOCRIT, BLOOD
HCT: 35.8 % — ABNORMAL LOW (ref 39.0–52.0)
Hemoglobin: 12.2 g/dL — ABNORMAL LOW (ref 13.0–17.0)

## 2019-06-21 SURGERY — XI ROBOTIC ASSISTED LAPAROSCOPIC RADICAL PROSTATECTOMY LEVEL 2
Anesthesia: General

## 2019-06-21 MED ORDER — ONDANSETRON HCL 4 MG/2ML IJ SOLN
INTRAMUSCULAR | Status: DC | PRN
  Administered 2019-06-21: 4 mg via INTRAVENOUS

## 2019-06-21 MED ORDER — PROMETHAZINE HCL 25 MG/ML IJ SOLN: 6.2500 mg | INTRAMUSCULAR | Status: DC | PRN

## 2019-06-21 MED ORDER — KETOROLAC TROMETHAMINE 15 MG/ML IJ SOLN
INTRAMUSCULAR | Status: AC
  Filled 2019-06-21: qty 1

## 2019-06-21 MED ORDER — SUGAMMADEX SODIUM 200 MG/2ML IV SOLN
INTRAVENOUS | Status: DC | PRN
  Administered 2019-06-21: 200 mg via INTRAVENOUS

## 2019-06-21 MED ORDER — MIDAZOLAM HCL 2 MG/2ML IJ SOLN
INTRAMUSCULAR | Status: AC
  Filled 2019-06-21: qty 2

## 2019-06-21 MED ORDER — IRBESARTAN 300 MG PO TABS
300.0000 mg | ORAL_TABLET | Freq: Every day | ORAL | Status: DC
  Administered 2019-06-22: 10:00:00 300 mg via ORAL
  Filled 2019-06-21 (×2): qty 1

## 2019-06-21 MED ORDER — FLUTICASONE PROPIONATE 50 MCG/ACT NA SUSP: 1.0000 | Freq: Every day | NASAL | Status: DC | PRN

## 2019-06-21 MED ORDER — OXYCODONE HCL 5 MG PO TABS
5.0000 mg | ORAL_TABLET | Freq: Once | ORAL | Status: AC | PRN
  Administered 2019-06-21: 5 mg via ORAL

## 2019-06-21 MED ORDER — MORPHINE SULFATE (PF) 2 MG/ML IV SOLN
2.0000 mg | INTRAVENOUS | Status: DC | PRN
  Administered 2019-06-21 – 2019-06-22 (×2): 2 mg via INTRAVENOUS
  Filled 2019-06-21 (×2): qty 1

## 2019-06-21 MED ORDER — SUFENTANIL CITRATE 50 MCG/ML IV SOLN
INTRAVENOUS | Status: DC | PRN
  Administered 2019-06-21 (×5): 10 ug via INTRAVENOUS

## 2019-06-21 MED ORDER — DEXAMETHASONE SODIUM PHOSPHATE 10 MG/ML IJ SOLN
INTRAMUSCULAR | Status: AC
  Filled 2019-06-21: qty 1

## 2019-06-21 MED ORDER — SODIUM CHLORIDE 0.9 % IR SOLN
Status: DC | PRN
  Administered 2019-06-21: 1000 mL via INTRAVESICAL

## 2019-06-21 MED ORDER — CHLORHEXIDINE GLUCONATE CLOTH 2 % EX PADS
6.0000 | MEDICATED_PAD | Freq: Every day | CUTANEOUS | Status: DC
  Administered 2019-06-21: 6 via TOPICAL

## 2019-06-21 MED ORDER — ONDANSETRON HCL 4 MG/2ML IJ SOLN
INTRAMUSCULAR | Status: AC
  Filled 2019-06-21: qty 2

## 2019-06-21 MED ORDER — ONDANSETRON HCL 4 MG/2ML IJ SOLN: 4.0000 mg | INTRAMUSCULAR | Status: DC | PRN

## 2019-06-21 MED ORDER — ACETAMINOPHEN 10 MG/ML IV SOLN
1000.0000 mg | Freq: Once | INTRAVENOUS | Status: DC | PRN
  Administered 2019-06-21: 1000 mg via INTRAVENOUS

## 2019-06-21 MED ORDER — CEFAZOLIN SODIUM-DEXTROSE 1-4 GM/50ML-% IV SOLN
1.0000 g | Freq: Three times a day (TID) | INTRAVENOUS | Status: AC
  Administered 2019-06-21 – 2019-06-22 (×2): 1 g via INTRAVENOUS
  Filled 2019-06-21 (×2): qty 50

## 2019-06-21 MED ORDER — INDIGOTINDISULFONATE SODIUM 8 MG/ML IJ SOLN
INTRAMUSCULAR | Status: DC | PRN
  Administered 2019-06-21: 5 mL via INTRAVENOUS

## 2019-06-21 MED ORDER — MAGNESIUM CITRATE PO SOLN
1.0000 | Freq: Once | ORAL | Status: DC
  Filled 2019-06-21: qty 296

## 2019-06-21 MED ORDER — LORATADINE 10 MG PO TABS: 10.0000 mg | ORAL_TABLET | Freq: Every day | ORAL | Status: DC | PRN

## 2019-06-21 MED ORDER — SODIUM CHLORIDE 0.9 % IV BOLUS
1000.0000 mL | Freq: Once | INTRAVENOUS | Status: AC
  Administered 2019-06-21: 1000 mL via INTRAVENOUS

## 2019-06-21 MED ORDER — SUFENTANIL CITRATE 50 MCG/ML IV SOLN
INTRAVENOUS | Status: AC
  Filled 2019-06-21: qty 1

## 2019-06-21 MED ORDER — BUPROPION HCL ER (SR) 150 MG PO TB12
150.0000 mg | ORAL_TABLET | Freq: Every day | ORAL | Status: DC
  Administered 2019-06-21 – 2019-06-22 (×2): 150 mg via ORAL
  Filled 2019-06-21 (×2): qty 1

## 2019-06-21 MED ORDER — DIPHENHYDRAMINE HCL 50 MG/ML IJ SOLN: 12.5000 mg | Freq: Four times a day (QID) | INTRAMUSCULAR | Status: DC | PRN

## 2019-06-21 MED ORDER — ROCURONIUM BROMIDE 10 MG/ML (PF) SYRINGE
PREFILLED_SYRINGE | INTRAVENOUS | Status: AC
  Filled 2019-06-21: qty 10

## 2019-06-21 MED ORDER — BUPIVACAINE-EPINEPHRINE (PF) 0.25% -1:200000 IJ SOLN
INTRAMUSCULAR | Status: AC
  Filled 2019-06-21: qty 30

## 2019-06-21 MED ORDER — DOCUSATE SODIUM 100 MG PO CAPS
100.0000 mg | ORAL_CAPSULE | Freq: Two times a day (BID) | ORAL | Status: DC
  Administered 2019-06-21 – 2019-06-22 (×2): 100 mg via ORAL
  Filled 2019-06-21 (×2): qty 1

## 2019-06-21 MED ORDER — OXYCODONE HCL 5 MG PO TABS
ORAL_TABLET | ORAL | Status: AC
  Filled 2019-06-21: qty 1

## 2019-06-21 MED ORDER — MIDAZOLAM HCL 2 MG/2ML IJ SOLN
INTRAMUSCULAR | Status: DC | PRN
  Administered 2019-06-21: 2 mg via INTRAVENOUS

## 2019-06-21 MED ORDER — HEPARIN SODIUM (PORCINE) 1000 UNIT/ML IJ SOLN
INTRAMUSCULAR | Status: AC
  Filled 2019-06-21: qty 1

## 2019-06-21 MED ORDER — BACITRACIN-NEOMYCIN-POLYMYXIN 400-5-5000 EX OINT: 1.0000 "application " | TOPICAL_OINTMENT | Freq: Three times a day (TID) | CUTANEOUS | Status: DC | PRN

## 2019-06-21 MED ORDER — EPHEDRINE 5 MG/ML INJ
INTRAVENOUS | Status: AC
  Filled 2019-06-21: qty 10

## 2019-06-21 MED ORDER — DEXAMETHASONE SODIUM PHOSPHATE 10 MG/ML IJ SOLN
INTRAMUSCULAR | Status: DC | PRN
  Administered 2019-06-21: 10 mg via INTRAVENOUS

## 2019-06-21 MED ORDER — HYDROMORPHONE HCL 1 MG/ML IJ SOLN
INTRAMUSCULAR | Status: AC
  Filled 2019-06-21: qty 1

## 2019-06-21 MED ORDER — LIDOCAINE 2% (20 MG/ML) 5 ML SYRINGE
INTRAMUSCULAR | Status: DC | PRN
  Administered 2019-06-21: 100 mg via INTRAVENOUS

## 2019-06-21 MED ORDER — STERILE WATER FOR IRRIGATION IR SOLN
Status: DC | PRN
  Administered 2019-06-21: 1000 mL

## 2019-06-21 MED ORDER — AMLODIPINE BESYLATE 10 MG PO TABS
10.0000 mg | ORAL_TABLET | Freq: Every day | ORAL | Status: DC
  Administered 2019-06-22: 10 mg via ORAL
  Filled 2019-06-21 (×2): qty 1

## 2019-06-21 MED ORDER — BUPIVACAINE-EPINEPHRINE (PF) 0.25% -1:200000 IJ SOLN
INTRAMUSCULAR | Status: DC | PRN
  Administered 2019-06-21: 30 mL

## 2019-06-21 MED ORDER — LIDOCAINE 2% (20 MG/ML) 5 ML SYRINGE
INTRAMUSCULAR | Status: AC
  Filled 2019-06-21: qty 5

## 2019-06-21 MED ORDER — BELLADONNA ALKALOIDS-OPIUM 16.2-60 MG RE SUPP: 1.0000 | Freq: Four times a day (QID) | RECTAL | Status: DC | PRN

## 2019-06-21 MED ORDER — ACETAMINOPHEN 325 MG PO TABS: 650.0000 mg | ORAL_TABLET | ORAL | Status: DC | PRN

## 2019-06-21 MED ORDER — ACETAMINOPHEN 10 MG/ML IV SOLN
INTRAVENOUS | Status: AC
  Filled 2019-06-21: qty 100

## 2019-06-21 MED ORDER — KETOROLAC TROMETHAMINE 15 MG/ML IJ SOLN
15.0000 mg | Freq: Four times a day (QID) | INTRAMUSCULAR | Status: DC
  Administered 2019-06-21 – 2019-06-22 (×4): 15 mg via INTRAVENOUS
  Filled 2019-06-21 (×3): qty 1

## 2019-06-21 MED ORDER — AMLODIPINE BESYLATE-VALSARTAN 10-320 MG PO TABS: 1.0000 | ORAL_TABLET | Freq: Every day | ORAL | Status: DC

## 2019-06-21 MED ORDER — ZOLPIDEM TARTRATE 5 MG PO TABS: 5.0000 mg | ORAL_TABLET | Freq: Every evening | ORAL | Status: DC | PRN

## 2019-06-21 MED ORDER — INDIGOTINDISULFONATE SODIUM 8 MG/ML IJ SOLN
INTRAMUSCULAR | Status: AC
  Filled 2019-06-21: qty 5

## 2019-06-21 MED ORDER — LACTATED RINGERS IV SOLN
INTRAVENOUS | Status: DC | PRN
  Administered 2019-06-21: 1000 mL

## 2019-06-21 MED ORDER — FLEET ENEMA 7-19 GM/118ML RE ENEM
1.0000 | ENEMA | Freq: Once | RECTAL | Status: DC
  Filled 2019-06-21: qty 1

## 2019-06-21 MED ORDER — EPHEDRINE SULFATE-NACL 50-0.9 MG/10ML-% IV SOSY
PREFILLED_SYRINGE | INTRAVENOUS | Status: DC | PRN
  Administered 2019-06-21 (×3): 10 mg via INTRAVENOUS
  Administered 2019-06-21: 5 mg via INTRAVENOUS

## 2019-06-21 MED ORDER — PROPOFOL 10 MG/ML IV BOLUS
INTRAVENOUS | Status: DC | PRN
  Administered 2019-06-21: 200 mg via INTRAVENOUS

## 2019-06-21 MED ORDER — KCL IN DEXTROSE-NACL 20-5-0.45 MEQ/L-%-% IV SOLN
INTRAVENOUS | Status: DC
  Filled 2019-06-21 (×2): qty 1000

## 2019-06-21 MED ORDER — CEFAZOLIN SODIUM-DEXTROSE 2-4 GM/100ML-% IV SOLN
2.0000 g | Freq: Once | INTRAVENOUS | Status: AC
  Administered 2019-06-21: 2 g via INTRAVENOUS

## 2019-06-21 MED ORDER — TRAMADOL HCL 50 MG PO TABS: 50.0000 mg | ORAL_TABLET | Freq: Four times a day (QID) | ORAL | 0 refills | Status: DC | PRN

## 2019-06-21 MED ORDER — SODIUM CHLORIDE (PF) 0.9 % IJ SOLN
INTRAMUSCULAR | Status: AC
  Filled 2019-06-21: qty 10

## 2019-06-21 MED ORDER — SULFAMETHOXAZOLE-TRIMETHOPRIM 800-160 MG PO TABS: 1.0000 | ORAL_TABLET | Freq: Two times a day (BID) | ORAL | 0 refills | Status: DC

## 2019-06-21 MED ORDER — ALLOPURINOL 100 MG PO TABS
200.0000 mg | ORAL_TABLET | Freq: Every day | ORAL | Status: DC
  Administered 2019-06-21 – 2019-06-22 (×2): 200 mg via ORAL
  Filled 2019-06-21 (×2): qty 2

## 2019-06-21 MED ORDER — ROCURONIUM BROMIDE 10 MG/ML (PF) SYRINGE
PREFILLED_SYRINGE | INTRAVENOUS | Status: DC | PRN
  Administered 2019-06-21 (×4): 20 mg via INTRAVENOUS
  Administered 2019-06-21 (×2): 10 mg via INTRAVENOUS
  Administered 2019-06-21: 80 mg via INTRAVENOUS
  Administered 2019-06-21: 20 mg via INTRAVENOUS
  Administered 2019-06-21: 5 mg via INTRAVENOUS

## 2019-06-21 MED ORDER — PROPOFOL 10 MG/ML IV BOLUS
INTRAVENOUS | Status: AC
  Filled 2019-06-21: qty 20

## 2019-06-21 MED ORDER — CEFAZOLIN SODIUM-DEXTROSE 2-4 GM/100ML-% IV SOLN
INTRAVENOUS | Status: AC
  Filled 2019-06-21: qty 100

## 2019-06-21 MED ORDER — LACTATED RINGERS IV SOLN: INTRAVENOUS | Status: DC

## 2019-06-21 MED ORDER — HYDROMORPHONE HCL 1 MG/ML IJ SOLN
0.2500 mg | INTRAMUSCULAR | Status: DC | PRN
  Administered 2019-06-21 (×2): 0.5 mg via INTRAVENOUS

## 2019-06-21 MED ORDER — HYDROMORPHONE HCL 1 MG/ML IJ SOLN
INTRAMUSCULAR | Status: DC | PRN
  Administered 2019-06-21 (×3): .5 mg via INTRAVENOUS
  Administered 2019-06-21 (×2): .25 mg via INTRAVENOUS

## 2019-06-21 MED ORDER — HYDROMORPHONE HCL 2 MG/ML IJ SOLN
INTRAMUSCULAR | Status: AC
  Filled 2019-06-21: qty 1

## 2019-06-21 MED ORDER — DIPHENHYDRAMINE HCL 12.5 MG/5ML PO ELIX: 12.5000 mg | ORAL_SOLUTION | Freq: Four times a day (QID) | ORAL | Status: DC | PRN

## 2019-06-21 MED ORDER — OXYCODONE HCL 5 MG/5ML PO SOLN: 5.0000 mg | Freq: Once | ORAL | Status: AC | PRN

## 2019-06-21 SURGICAL SUPPLY — 60 items
APPLICATOR COTTON TIP 6 STRL (MISCELLANEOUS) ×2 IMPLANT
APPLICATOR COTTON TIP 6IN STRL (MISCELLANEOUS) ×4
CATH FOLEY 2WAY SLVR 18FR 30CC (CATHETERS) ×4 IMPLANT
CATH ROBINSON RED A/P 16FR (CATHETERS) ×4 IMPLANT
CATH ROBINSON RED A/P 8FR (CATHETERS) ×4 IMPLANT
CATH TIEMANN FOLEY 18FR 5CC (CATHETERS) ×4 IMPLANT
CHLORAPREP W/TINT 26 (MISCELLANEOUS) ×4 IMPLANT
CLIP VESOLOCK LG 6/CT PURPLE (CLIP) ×12 IMPLANT
COVER SURGICAL LIGHT HANDLE (MISCELLANEOUS) ×4 IMPLANT
COVER TIP SHEARS 8 DVNC (MISCELLANEOUS) ×2 IMPLANT
COVER TIP SHEARS 8MM DA VINCI (MISCELLANEOUS) ×4
COVER WAND RF STERILE (DRAPES) IMPLANT
CUTTER ECHEON FLEX ENDO 45 340 (ENDOMECHANICALS) ×4 IMPLANT
DECANTER SPIKE VIAL GLASS SM (MISCELLANEOUS) ×4 IMPLANT
DERMABOND ADVANCED (GAUZE/BANDAGES/DRESSINGS) ×2
DERMABOND ADVANCED .7 DNX12 (GAUZE/BANDAGES/DRESSINGS) ×2 IMPLANT
DRAIN CHANNEL RND F F (WOUND CARE) IMPLANT
DRAPE ARM DVNC X/XI (DISPOSABLE) ×8 IMPLANT
DRAPE COLUMN DVNC XI (DISPOSABLE) ×2 IMPLANT
DRAPE DA VINCI XI ARM (DISPOSABLE) ×16
DRAPE DA VINCI XI COLUMN (DISPOSABLE) ×4
DRAPE SURG IRRIG POUCH 19X23 (DRAPES) ×4 IMPLANT
DRSG TEGADERM 4X4.75 (GAUZE/BANDAGES/DRESSINGS) ×4 IMPLANT
ELECT REM PT RETURN 15FT ADLT (MISCELLANEOUS) ×4 IMPLANT
GLOVE BIO SURGEON STRL SZ 6.5 (GLOVE) ×3 IMPLANT
GLOVE BIO SURGEONS STRL SZ 6.5 (GLOVE) ×1
GLOVE BIOGEL M STRL SZ7.5 (GLOVE) ×8 IMPLANT
GOWN STRL REUS W/TWL LRG LVL3 (GOWN DISPOSABLE) ×12 IMPLANT
HOLDER FOLEY CATH W/STRAP (MISCELLANEOUS) ×4 IMPLANT
IRRIG SUCT STRYKERFLOW 2 WTIP (MISCELLANEOUS) ×4
IRRIGATION SUCT STRKRFLW 2 WTP (MISCELLANEOUS) ×2 IMPLANT
IV LACTATED RINGERS 1000ML (IV SOLUTION) ×4 IMPLANT
KIT TURNOVER KIT A (KITS) IMPLANT
NDL SAFETY ECLIPSE 18X1.5 (NEEDLE) ×2 IMPLANT
NEEDLE HYPO 18GX1.5 SHARP (NEEDLE) ×4
PACK ROBOT UROLOGY CUSTOM (CUSTOM PROCEDURE TRAY) ×4 IMPLANT
PENCIL SMOKE EVACUATOR (MISCELLANEOUS) IMPLANT
SEAL CANN UNIV 5-8 DVNC XI (MISCELLANEOUS) ×8 IMPLANT
SEAL XI 5MM-8MM UNIVERSAL (MISCELLANEOUS) ×16
SET TUBE SMOKE EVAC HIGH FLOW (TUBING) ×4 IMPLANT
SOLUTION ELECTROLUBE (MISCELLANEOUS) ×4 IMPLANT
STAPLE RELOAD 45 GRN (STAPLE) ×2 IMPLANT
STAPLE RELOAD 45MM GREEN (STAPLE) ×4
SUT ETHILON 3 0 PS 1 (SUTURE) ×4 IMPLANT
SUT MNCRL 3 0 RB1 (SUTURE) ×2 IMPLANT
SUT MNCRL 3 0 VIOLET RB1 (SUTURE) ×2 IMPLANT
SUT MNCRL AB 4-0 PS2 18 (SUTURE) ×8 IMPLANT
SUT MONOCRYL 3 0 RB1 (SUTURE) ×8
SUT VIC AB 0 CT1 27 (SUTURE) ×4
SUT VIC AB 0 CT1 27XBRD ANTBC (SUTURE) ×2 IMPLANT
SUT VIC AB 0 UR5 27 (SUTURE) ×4 IMPLANT
SUT VIC AB 2-0 SH 27 (SUTURE) ×4
SUT VIC AB 2-0 SH 27X BRD (SUTURE) ×2 IMPLANT
SUT VIC AB 3-0 SH 27 (SUTURE) ×4
SUT VIC AB 3-0 SH 27XBRD (SUTURE) ×2 IMPLANT
SUT VICRYL 0 UR6 27IN ABS (SUTURE) ×8 IMPLANT
SYR 27GX1/2 1ML LL SAFETY (SYRINGE) ×4 IMPLANT
TOWEL OR NON WOVEN STRL DISP B (DISPOSABLE) ×4 IMPLANT
TROCAR XCEL NON-BLD 5MMX100MML (ENDOMECHANICALS) IMPLANT
WATER STERILE IRR 1000ML POUR (IV SOLUTION) ×4 IMPLANT

## 2019-06-21 NOTE — Progress Notes (Signed)
Pt arrived to unit from PACU. No complications noted. Pt resting in bed. VSS. No c/o pain at this time. Ordered a clear liquid dinner tray. Call light within reach. Will cont to mx.

## 2019-06-21 NOTE — Anesthesia Postprocedure Evaluation (Signed)
Anesthesia Post Note  Patient: Darren Gay  Procedure(s) Performed: XI ROBOTIC ASSISTED LAPAROSCOPIC RADICAL PROSTATECTOMY LEVEL 2 (N/A ) LYMPHADENECTOMY, PELVIC (Bilateral )     Patient location during evaluation: PACU Anesthesia Type: General Level of consciousness: awake and alert Pain management: pain level controlled Vital Signs Assessment: post-procedure vital signs reviewed and stable Respiratory status: spontaneous breathing, nonlabored ventilation, respiratory function stable and patient connected to nasal cannula oxygen Cardiovascular status: blood pressure returned to baseline and stable Postop Assessment: no apparent nausea or vomiting Anesthetic complications: no    Last Vitals:  Vitals:   06/21/19 1215 06/21/19 1230  BP: (!) 142/85 134/79  Pulse: 78 75  Resp: 15 12  Temp:  36.8 C  SpO2: 98% 100%    Last Pain:  Vitals:   06/21/19 1300  TempSrc:   PainSc: 3                  Taden Witter S

## 2019-06-21 NOTE — Anesthesia Procedure Notes (Signed)
Procedure Name: Intubation Date/Time: 06/21/2019 7:23 AM Performed by: Sharlette Dense, CRNA Patient Re-evaluated:Patient Re-evaluated prior to induction Oxygen Delivery Method: Circle system utilized Preoxygenation: Pre-oxygenation with 100% oxygen Induction Type: IV induction Ventilation: Mask ventilation without difficulty and Oral airway inserted - appropriate to patient size Laryngoscope Size: Glidescope and 3 Grade View: Grade I Tube type: Parker flex tip Tube size: 7.5 mm Number of attempts: 1 Airway Equipment and Method: Video-laryngoscopy and Stylet Placement Confirmation: ETT inserted through vocal cords under direct vision,  positive ETCO2 and breath sounds checked- equal and bilateral Secured at: 22 cm Tube secured with: Tape Dental Injury: Teeth and Oropharynx as per pre-operative assessment  Difficulty Due To: Difficulty was anticipated, Difficult Airway- due to large tongue, Difficult Airway- due to anterior larynx and Difficult Airway- due to limited oral opening

## 2019-06-21 NOTE — Progress Notes (Signed)
Pt refused CPAP-qhs.  Pt states he expects to be here only for tonight and doesn't need the CPAP for tonight.  Pt states he will elevate the head of the bed and sleep tonight.  Pt encouraged to contact RT should he change his mind.

## 2019-06-21 NOTE — Transfer of Care (Signed)
Immediate Anesthesia Transfer of Care Note  Patient: Darren Gay  Procedure(s) Performed: XI ROBOTIC ASSISTED LAPAROSCOPIC RADICAL PROSTATECTOMY LEVEL 2 (N/A ) LYMPHADENECTOMY, PELVIC (Bilateral )  Patient Location: PACU  Anesthesia Type:General  Level of Consciousness: awake, alert  and oriented  Airway & Oxygen Therapy: Patient Spontanous Breathing and Patient connected to face mask oxygen  Post-op Assessment: Report given to RN and Post -op Vital signs reviewed and stable  Post vital signs: Reviewed and stable  Last Vitals:  Vitals Value Taken Time  BP    Temp    Pulse    Resp    SpO2      Last Pain:  Vitals:   06/21/19 0626  TempSrc:   PainSc: 0-No pain         Complications: No apparent anesthesia complications

## 2019-06-21 NOTE — Progress Notes (Signed)
Patient ID: Darren Gay, male   DOB: 1951-03-23, 69 y.o.   MRN: [id number]  Post-op note  Subjective: The patient is doing well.  No complaints.  Denies N/V.  Objective: Vital signs in last 24 hours: Temp:  [98.2 F (36.8 C)-98.3 F (36.8 C)] 98.2 F (36.8 C) (03/11 1230) Pulse Rate:  [54-78] 75 (03/11 1230) Resp:  [12-17] 12 (03/11 1230) BP: (120-144)/(79-91) 134/79 (03/11 1230) SpO2:  [98 %-100 %] 100 % (03/11 1230) Weight:  [99.4 kg] 99.4 kg (03/11 0626)  Intake/Output from previous day: No intake/output data recorded. Intake/Output this shift: Total I/O In: 2250 [I.V.:2150; IV Piggyback:100] Out: 350 [Blood:350]  Physical Exam:  General: Alert and oriented. Abdomen: Soft, Nondistended. Incisions: Clean and dry. Urine: Dark purple/red  Lab Results: Recent Labs    06/21/19 1235  HGB 12.2*  HCT 35.8*    Assessment/Plan: POD#0   1) Continue to monitor  2) DVT prophy, clears, IS, amb, pain control   LOS: 0 days   Debbrah Alar 06/21/2019, 3:21 PM

## 2019-06-21 NOTE — Discharge Instructions (Signed)
°  1. Activity:  You are encouraged to ambulate frequently (about every hour during waking hours) to help prevent blood clots from forming in your legs or lungs.  However, you should not engage in any heavy lifting (> 10-15 lbs), strenuous activity, or straining. °2. Diet: You should continue a clear liquid diet until passing gas from below.  Once this occurs, you may advance your diet to a soft diet that would be easy to digest (i.e soups, scrambled eggs, mashed potatoes, etc.) for 24 hours just as you would if getting over a bad stomach flu.  If tolerating this diet well for 24 hours, you may then begin eating regular food.  It will be normal to have some amount of bloating, nausea, and abdominal discomfort intermittently. °3. Prescriptions:  You will be provided a prescription for pain medication to take as needed.  If your pain is not severe enough to require the prescription pain medication, you may take Tylenol instead.  You should also take an over the counter stool softener (Colace 100 mg twice daily) to avoid straining with bowel movements as the pain medication may constipate you. Finally, you will also be provided a prescription for an antibiotic to begin the day prior to your return visit in the office for catheter removal. °4. Catheter care: You will be taught how to take care of the catheter by the nursing staff prior to discharge from the hospital.  You may use both a leg bag and the larger bedside bag but it is recommended to at least use the bigger bedside bag at nighttime as the leg bag is small and will fill up overnight and also does not drain as well when lying flat. You may periodically feel a strong urge to void with the catheter in place.  This is a bladder spasm and most often can occur when having a bowel movement or when you are moving around. It is typically self-limited and usually will stop after a few minutes.  You may use some Vaseline or Neosporin around the tip of the catheter to  reduce friction at the tip of the penis. °5. Incisions: You may remove your dressing bandages the 2nd day after surgery.  You most likely will have a few small staples in each of the incisions and once the bandages are removed, the incisions may stay open to air.  You may start showering (not soaking or bathing in water) 48 hours after surgery and the incisions simply need to be patted dry after the shower.  No additional care is needed. °6. What to call us about: You should call the office ([phone]) if you develop fever > 101, persistent vomiting, or the catheter stops draining. Also, feel free to call with any other questions you may have and remember the handout that was provided to you as a reference preoperatively which answers many of the common questions that arise after surgery. °7. You may resume mobic, aspirin, advil, aleve, vitamins, and supplements 7 days after surgery. °

## 2019-06-21 NOTE — Op Note (Signed)
Preoperative diagnosis: Clinically localized adenocarcinoma of the prostate (clinical stage T1c Nx Mx)  Postoperative diagnosis: Clinically localized adenocarcinoma of the prostate (clinical stage T1c Nx Mx)  Procedure:  1. Robotic assisted laparoscopic radical prostatectomy (left nerve sparing) 2. Bilateral robotic assisted laparoscopic pelvic lymphadenectomy  Surgeon: Pryor Curia. M.D.  Assistant(s): Debbrah Alar, PA-C  An assistant was required for this surgical procedure.  The duties of the assistant included but were not limited to suctioning, passing suture, camera manipulation, retraction. This procedure would not be able to be performed without an Environmental consultant.   Resident: Dr. Kerrie Pleasure  Anesthesia: General  Complications: None  EBL: 300 mL  IVF:  2000 mL crystalloid  Specimens: 1. Prostate and seminal vesicles 2. Right pelvic lymph nodes 3. Left pelvic lymph nodes  Disposition of specimens: Pathology  Drains: 1. 20 Fr coude catheter 2. # 19 Blake pelvic drain  Indication: Darren Gay is a 69 y.o. patient with clinically localized prostate cancer.  After a thorough review of the management options for treatment of prostate cancer, he elected to proceed with surgical therapy and the above procedure(s).  We have discussed the potential benefits and risks of the procedure, side effects of the proposed treatment, the likelihood of the patient achieving the goals of the procedure, and any potential problems that might occur during the procedure or recuperation. Informed consent has been obtained.  Description of procedure:  The patient was taken to the operating room and a general anesthetic was administered. He was given preoperative antibiotics, placed in the dorsal lithotomy position, and prepped and draped in the usual sterile fashion. Next a preoperative timeout was performed. A urethral catheter was placed into the bladder and a site was selected near  the umbilicus for placement of the camera port. This was placed using a standard open Hassan technique which allowed entry into the peritoneal cavity under direct vision and without difficulty. An 8 mm port was placed and a pneumoperitoneum established. The camera was then used to inspect the abdomen and there was no evidence of any intra-abdominal injuries or other abnormalities. The remaining abdominal ports were then placed. 8 mm robotic ports were placed in the right lower quadrant, left lower quadrant, and far left lateral abdominal wall. A 5 mm port was placed in the right upper quadrant and a 12 mm port was placed in the right lateral abdominal wall for laparoscopic assistance. All ports were placed under direct vision without difficulty. The surgical cart was then docked.   Utilizing the cautery scissors, the bladder was reflected posteriorly allowing entry into the space of Retzius and identification of the endopelvic fascia and prostate. The periprostatic fat was then removed from the prostate allowing full exposure of the endopelvic fascia. The endopelvic fascia was then incised from the apex back to the base of the prostate bilaterally and the underlying levator muscle fibers were swept laterally off the prostate thereby isolating the dorsal venous complex. The dorsal vein was then stapled and divided with a 45 mm Flex Echelon stapler. Attention then turned to the bladder neck which was divided anteriorly thereby allowing entry into the bladder and exposure of the urethral catheter. The catheter balloon was deflated and the catheter was brought into the operative field and used to retract the prostate anteriorly. The posterior bladder neck was then examined.  There was a large median lobe that was carefully dissected and lifted upward and the posterior bladder neck was divided allowing further dissection between the bladder  and prostate posteriorly until the vasa deferentia and seminal vessels were  identified. The vasa deferentia were isolated, divided, and lifted anteriorly. The seminal vesicles were dissected down to their tips with care to control the seminal vascular arterial blood supply. These structures were then lifted anteriorly and the space between Denonvillier's fascia and the anterior rectum was developed with a combination of sharp and blunt dissection. This isolated the vascular pedicles of the prostate.  The lateral prostatic fascia on the left side of the prostate was then sharply incised allowing release of the neurovascular bundle. The vascular pedicle of the prostate on the left side was then ligated with Weck clips between the prostate and neurovascular bundle and divided with sharp cold scissor dissection resulting in neurovascular bundle preservation. On the right side, a wide non nerve sparing dissection was performed with Weck clips used to ligate the vascular pedicle of the prostate. The neurovascular bundle on the left side was then separated off the apex of the prostate and urethra.   The urethra was then sharply transected allowing the prostate specimen to be disarticulated. The pelvis was copiously irrigated and hemostasis was ensured. There was no evidence for rectal injury.  Attention then turned to the right pelvic sidewall. The fibrofatty tissue between the external iliac vein, confluence of the iliac vessels, hypogastric artery, and Cooper's ligament was dissected free from the pelvic sidewall with care to preserve the obturator nerve. Weck clips were used for lymphostasis and hemostasis. An identical procedure was performed on the contralateral side and the lymphatic packets were removed for permanent pathologic analysis.  Attention then turned to the urethral anastomosis. A 2-0 Vicryl slip knot was placed between Denonvillier's fascia, the posterior bladder neck, and the posterior urethra to reapproximate these structures. A double-armed 3-0 Monocryl suture was  then used to perform a 360 running tension-free anastomosis between the bladder neck and urethra. A new urethral catheter was then placed into the bladder and irrigated. There were no blood clots within the bladder and the anastomosis appeared to be watertight. A #19 Blake drain was then brought through the left lateral 8 mm port site and positioned appropriately within the pelvis. It was secured to the skin with a nylon suture. The surgical cart was then undocked. The right lateral 12 mm port site was closed at the fascial level with a 0 Vicryl suture placed laparoscopically. All remaining ports were then removed under direct vision. The prostate specimen was removed intact within the Endopouch retrieval bag via the periumbilical camera port site. This fascial opening was closed with two running 0 Vicryl sutures. 0.25% Marcaine was then injected into all port sites and all incisions were reapproximated at the skin level with 4-0 Monocryl subcuticular sutures and Dermabond. The patient appeared to tolerate the procedure well and without complications. The patient was able to be extubated and transferred to the recovery unit in satisfactory condition.   Pryor Curia MD

## 2019-06-22 DIAGNOSIS — C61 Malignant neoplasm of prostate: Secondary | ICD-10-CM | POA: Diagnosis not present

## 2019-06-22 LAB — HEMOGLOBIN AND HEMATOCRIT, BLOOD
HCT: 33 % — ABNORMAL LOW (ref 39.0–52.0)
Hemoglobin: 11.1 g/dL — ABNORMAL LOW (ref 13.0–17.0)

## 2019-06-22 MED ORDER — TRAMADOL HCL 50 MG PO TABS
50.0000 mg | ORAL_TABLET | Freq: Four times a day (QID) | ORAL | Status: DC | PRN
  Administered 2019-06-22: 100 mg via ORAL
  Filled 2019-06-22: qty 2

## 2019-06-22 MED ORDER — BISACODYL 10 MG RE SUPP
10.0000 mg | Freq: Once | RECTAL | Status: AC
  Administered 2019-06-22: 08:00:00 10 mg via RECTAL
  Filled 2019-06-22: qty 1

## 2019-06-22 NOTE — Discharge Summary (Signed)
Date of admission: 06/21/2019  Date of discharge: 06/22/2019  Admission diagnosis: Prostate Cancer  Discharge diagnosis: Prostate Cancer  History and Physical: For full details, please see admission history and physical. Briefly, Darren Gay is a 69 y.o. gentleman with localized prostate cancer.  After discussing management/treatment options, he elected to proceed with surgical treatment.  Hospital Course: EDI GORNIAK was taken to the operating room on 06/21/2019 and underwent a robotic assisted laparoscopic radical prostatectomy. He tolerated this procedure well and without complications. Postoperatively, he was able to be transferred to a regular hospital room following recovery from anesthesia.  He was able to begin ambulating the night of surgery. He remained hemodynamically stable overnight.  He had excellent urine output with appropriately minimal output from his pelvic drain and his pelvic drain was removed on POD #1.  He was transitioned to oral pain medication, tolerated a clear liquid diet, and had met all discharge criteria and was able to be discharged home later on POD#1.  Laboratory values:  Recent Labs    06/21/19 1235 06/22/19 0436  HGB 12.2* 11.1*  HCT 35.8* 33.0*    Disposition: Home  Discharge instruction: He was instructed to be ambulatory but to refrain from heavy lifting, strenuous activity, or driving. He was instructed on urethral catheter care.  Discharge medications:   Allergies as of 06/22/2019      Reactions   Colchicine Other (See Comments)   makes me fatigue      Medication List    STOP taking these medications   aspirin 81 MG tablet   b complex vitamins tablet   cholecalciferol 1000 units tablet Commonly known as: VITAMIN D   Fish Oil 1200 MG Caps   Fish Oil-Flax Oil-Borage Oil Caps   meloxicam 15 MG tablet Commonly known as: MOBIC   MULTIVITAMIN PO   naproxen sodium 220 MG tablet Commonly known as: ALEVE   PROBIOTIC DAILY PO    TURMERIC PO     TAKE these medications   allopurinol 100 MG tablet Commonly known as: ZYLOPRIM Take 200 mg by mouth daily.   amLODipine-valsartan 10-320 MG tablet Commonly known as: EXFORGE Take 1 tablet by mouth daily.   buPROPion 150 MG 12 hr tablet Commonly known as: WELLBUTRIN SR Take 150 mg by mouth daily.   Cialis 20 MG tablet Generic drug: tadalafil Take 10 mg by mouth daily as needed for erectile dysfunction.   Efinaconazole 10 % Soln Apply 1 drop topically daily.   fexofenadine 180 MG tablet Commonly known as: ALLEGRA Take 180 mg by mouth as needed for allergies or rhinitis.   fluticasone 50 MCG/ACT nasal spray Commonly known as: FLONASE Place 1 spray into both nostrils daily as needed for allergies.   modafinil 200 MG tablet Commonly known as: PROVIGIL Take 200 mg by mouth daily.   sulfamethoxazole-trimethoprim 800-160 MG tablet Commonly known as: BACTRIM DS Take 1 tablet by mouth 2 (two) times daily. Start the day prior to foley removal appointment   traMADol 50 MG tablet Commonly known as: Ultram Take 1-2 tablets (50-100 mg total) by mouth every 6 (six) hours as needed for moderate pain or severe pain.       Followup: He will followup in 1 week for catheter removal and to discuss his surgical pathology results.

## 2019-06-22 NOTE — Progress Notes (Signed)
1 Day Post-Op Subjective: The patient is doing well.  No nausea or vomiting. Pain is adequately controlled.  Objective: Vital signs in last 24 hours: Temp:  [97.8 F (36.6 C)-98.3 F (36.8 C)] 97.8 F (36.6 C) (03/12 0614) Pulse Rate:  [54-78] 60 (03/12 0614) Resp:  [12-18] 16 (03/12 0614) BP: (96-144)/(58-91) 104/73 (03/12 0614) SpO2:  [95 %-100 %] 96 % (03/12 0614) Weight:  [99.4 kg] 99.4 kg (03/12 0213)  Intake/Output from previous day: 03/11 0701 - 03/12 0700 In: 4586.8 [P.O.:660; I.V.:2750; IV Piggyback:1176.8] Out: 1990 [Urine:1550; Drains:90; Blood:350] Intake/Output this shift: Total I/O In: 676.8 [I.V.:600; IV Piggyback:76.8] Out: 1575 [Urine:1550; Drains:25]  Physical Exam:  General: Alert and oriented. CV: Regular rate Lungs: Clear bilaterally. GI: Soft, Nondistended. Appropriately tender. JP drain with scan serosang fluid.  Incisions: Clean, dry, and intact Urine: Clear Extremities: Nontender, no erythema, no edema.  Lab Results: Recent Labs    06/21/19 1235 06/22/19 0436  HGB 12.2* 11.1*  HCT 35.8* 33.0*      Assessment/Plan: POD# 1 s/p robotic prostatectomy.  1) SL IVF 2) Ambulate, Incentive spirometry 3) Transition to oral pain medication 4) Dulcolax suppository 5) D/C pelvic drain 6) Plan for likely discharge later today    LOS: 0 days   Haskel Schroeder 06/22/2019, 7:00 AM

## 2019-06-27 LAB — SURGICAL PATHOLOGY

## 2019-08-09 ENCOUNTER — Ambulatory Visit: Payer: Medicare Other | Attending: Internal Medicine

## 2019-08-09 DIAGNOSIS — Z23 Encounter for immunization: Secondary | ICD-10-CM

## 2019-08-09 NOTE — Progress Notes (Signed)
   Covid-19 Vaccination Clinic  Name:  Darren Gay    MRN: [id number] DOB: [birth year]  08/09/2019  [name] was observed post Covid-19 immunization for 15 minutes without incident. He was provided with Vaccine Information Sheet and instruction to access the V-Safe system.   [name] was instructed to call 911 with any severe reactions post vaccine: Marland Kitchen Difficulty breathing  . Swelling of face and throat  . A fast heartbeat  . A bad rash all over body  . Dizziness and weakness   Immunizations Administered    Name Date Dose VIS Date Route   Pfizer COVID-19 Vaccine 08/09/2019  4:23 PM 0.3 mL 06/06/2018 Intramuscular   Manufacturer: [place]   Lot: [id number]   [place]: [id number]

## 2019-08-28 DIAGNOSIS — I879 Disorder of vein, unspecified: Secondary | ICD-10-CM | POA: Diagnosis not present

## 2019-08-28 DIAGNOSIS — C61 Malignant neoplasm of prostate: Secondary | ICD-10-CM | POA: Diagnosis not present

## 2019-08-28 DIAGNOSIS — I10 Essential (primary) hypertension: Secondary | ICD-10-CM | POA: Diagnosis not present

## 2019-09-03 ENCOUNTER — Ambulatory Visit: Payer: Medicare Other

## 2019-09-03 ENCOUNTER — Ambulatory Visit: Payer: PPO | Attending: Internal Medicine

## 2019-09-03 DIAGNOSIS — Z23 Encounter for immunization: Secondary | ICD-10-CM

## 2019-09-03 NOTE — Progress Notes (Signed)
   Covid-19 Vaccination Clinic  Name:  Darren Gay    MRN: [id number] DOB: [birth year]  09/03/2019  [name] was observed post Covid-19 immunization for 15 minutes without incident. He was provided with Vaccine Information Sheet and instruction to access the V-Safe system.   [name] was instructed to call 911 with any severe reactions post vaccine: Marland Kitchen Difficulty breathing  . Swelling of face and throat  . A fast heartbeat  . A bad rash all over body  . Dizziness and weakness   Immunizations Administered    Name Date Dose VIS Date Route   Pfizer COVID-19 Vaccine 09/03/2019  1:06 PM 0.3 mL 06/06/2018 Intramuscular   Manufacturer: [place]   Lot: [id number]   [place]: [id number]

## 2019-09-29 DIAGNOSIS — R197 Diarrhea, unspecified: Secondary | ICD-10-CM | POA: Diagnosis not present

## 2019-10-01 DIAGNOSIS — E782 Mixed hyperlipidemia: Secondary | ICD-10-CM | POA: Diagnosis not present

## 2019-10-01 DIAGNOSIS — I10 Essential (primary) hypertension: Secondary | ICD-10-CM | POA: Diagnosis not present

## 2019-10-01 DIAGNOSIS — F5221 Male erectile disorder: Secondary | ICD-10-CM | POA: Diagnosis not present

## 2019-10-01 DIAGNOSIS — F331 Major depressive disorder, recurrent, moderate: Secondary | ICD-10-CM | POA: Diagnosis not present

## 2019-10-01 DIAGNOSIS — G4710 Hypersomnia, unspecified: Secondary | ICD-10-CM | POA: Diagnosis not present

## 2019-10-01 DIAGNOSIS — H6500 Acute serous otitis media, unspecified ear: Secondary | ICD-10-CM | POA: Diagnosis not present

## 2019-10-01 DIAGNOSIS — E291 Testicular hypofunction: Secondary | ICD-10-CM | POA: Diagnosis not present

## 2019-10-01 DIAGNOSIS — F330 Major depressive disorder, recurrent, mild: Secondary | ICD-10-CM | POA: Diagnosis not present

## 2019-10-01 DIAGNOSIS — F339 Major depressive disorder, recurrent, unspecified: Secondary | ICD-10-CM | POA: Diagnosis not present

## 2019-10-01 DIAGNOSIS — Z125 Encounter for screening for malignant neoplasm of prostate: Secondary | ICD-10-CM | POA: Diagnosis not present

## 2019-10-01 DIAGNOSIS — I879 Disorder of vein, unspecified: Secondary | ICD-10-CM | POA: Diagnosis not present

## 2019-10-01 DIAGNOSIS — G4733 Obstructive sleep apnea (adult) (pediatric): Secondary | ICD-10-CM | POA: Diagnosis not present

## 2019-10-01 DIAGNOSIS — C61 Malignant neoplasm of prostate: Secondary | ICD-10-CM | POA: Diagnosis not present

## 2019-10-03 DIAGNOSIS — C61 Malignant neoplasm of prostate: Secondary | ICD-10-CM | POA: Diagnosis not present

## 2019-10-08 DIAGNOSIS — Z0001 Encounter for general adult medical examination with abnormal findings: Secondary | ICD-10-CM | POA: Diagnosis not present

## 2019-10-08 DIAGNOSIS — E782 Mixed hyperlipidemia: Secondary | ICD-10-CM | POA: Diagnosis not present

## 2019-10-08 DIAGNOSIS — F331 Major depressive disorder, recurrent, moderate: Secondary | ICD-10-CM | POA: Diagnosis not present

## 2019-10-08 DIAGNOSIS — E291 Testicular hypofunction: Secondary | ICD-10-CM | POA: Diagnosis not present

## 2019-10-08 DIAGNOSIS — G4710 Hypersomnia, unspecified: Secondary | ICD-10-CM | POA: Diagnosis not present

## 2019-10-08 DIAGNOSIS — G4733 Obstructive sleep apnea (adult) (pediatric): Secondary | ICD-10-CM | POA: Diagnosis not present

## 2019-10-08 DIAGNOSIS — E663 Overweight: Secondary | ICD-10-CM | POA: Diagnosis not present

## 2019-10-08 DIAGNOSIS — M1000 Idiopathic gout, unspecified site: Secondary | ICD-10-CM | POA: Diagnosis not present

## 2019-10-08 DIAGNOSIS — C61 Malignant neoplasm of prostate: Secondary | ICD-10-CM | POA: Diagnosis not present

## 2019-10-08 DIAGNOSIS — I10 Essential (primary) hypertension: Secondary | ICD-10-CM | POA: Diagnosis not present

## 2019-10-08 DIAGNOSIS — R600 Localized edema: Secondary | ICD-10-CM | POA: Diagnosis not present

## 2019-10-08 DIAGNOSIS — R7301 Impaired fasting glucose: Secondary | ICD-10-CM | POA: Diagnosis not present

## 2019-10-10 DIAGNOSIS — C61 Malignant neoplasm of prostate: Secondary | ICD-10-CM | POA: Diagnosis not present

## 2019-10-10 DIAGNOSIS — N5201 Erectile dysfunction due to arterial insufficiency: Secondary | ICD-10-CM | POA: Diagnosis not present

## 2019-10-10 DIAGNOSIS — N393 Stress incontinence (female) (male): Secondary | ICD-10-CM | POA: Diagnosis not present

## 2020-01-18 ENCOUNTER — Other Ambulatory Visit (HOSPITAL_COMMUNITY): Payer: Self-pay | Admitting: Adult Health Nurse Practitioner

## 2020-01-18 ENCOUNTER — Other Ambulatory Visit: Payer: Self-pay

## 2020-01-18 ENCOUNTER — Ambulatory Visit (HOSPITAL_COMMUNITY)
Admission: RE | Admit: 2020-01-18 | Discharge: 2020-01-18 | Disposition: A | Discharge status: Home / Self Care | Payer: PPO | Source: Ambulatory Visit | Attending: Adult Health Nurse Practitioner | Admitting: Adult Health Nurse Practitioner

## 2020-01-18 DIAGNOSIS — F5221 Male erectile disorder: Secondary | ICD-10-CM | POA: Diagnosis not present

## 2020-01-18 DIAGNOSIS — M79641 Pain in right hand: Secondary | ICD-10-CM | POA: Diagnosis not present

## 2020-01-18 DIAGNOSIS — H6500 Acute serous otitis media, unspecified ear: Secondary | ICD-10-CM | POA: Diagnosis not present

## 2020-01-18 DIAGNOSIS — E782 Mixed hyperlipidemia: Secondary | ICD-10-CM | POA: Diagnosis not present

## 2020-01-18 DIAGNOSIS — M6281 Muscle weakness (generalized): Secondary | ICD-10-CM | POA: Diagnosis not present

## 2020-01-18 DIAGNOSIS — M79606 Pain in leg, unspecified: Secondary | ICD-10-CM

## 2020-01-18 DIAGNOSIS — J3489 Other specified disorders of nose and nasal sinuses: Secondary | ICD-10-CM | POA: Diagnosis not present

## 2020-01-18 DIAGNOSIS — R7303 Prediabetes: Secondary | ICD-10-CM | POA: Diagnosis not present

## 2020-01-18 DIAGNOSIS — Z23 Encounter for immunization: Secondary | ICD-10-CM | POA: Diagnosis not present

## 2020-01-18 DIAGNOSIS — G9009 Other idiopathic peripheral autonomic neuropathy: Secondary | ICD-10-CM | POA: Diagnosis not present

## 2020-01-18 DIAGNOSIS — S32000A Wedge compression fracture of unspecified lumbar vertebra, initial encounter for closed fracture: Secondary | ICD-10-CM | POA: Diagnosis not present

## 2020-01-18 DIAGNOSIS — I10 Essential (primary) hypertension: Secondary | ICD-10-CM | POA: Diagnosis not present

## 2020-01-18 DIAGNOSIS — E291 Testicular hypofunction: Secondary | ICD-10-CM | POA: Diagnosis not present

## 2020-01-18 DIAGNOSIS — F339 Major depressive disorder, recurrent, unspecified: Secondary | ICD-10-CM | POA: Diagnosis not present

## 2020-01-18 DIAGNOSIS — R0981 Nasal congestion: Secondary | ICD-10-CM | POA: Diagnosis not present

## 2020-01-18 DIAGNOSIS — M1000 Idiopathic gout, unspecified site: Secondary | ICD-10-CM | POA: Diagnosis not present

## 2020-02-07 DIAGNOSIS — Z20822 Contact with and (suspected) exposure to covid-19: Secondary | ICD-10-CM | POA: Diagnosis not present

## 2020-02-25 DIAGNOSIS — M545 Low back pain, unspecified: Secondary | ICD-10-CM | POA: Diagnosis not present

## 2020-02-25 DIAGNOSIS — M4310 Spondylolisthesis, site unspecified: Secondary | ICD-10-CM | POA: Insufficient documentation

## 2020-02-26 DIAGNOSIS — J0100 Acute maxillary sinusitis, unspecified: Secondary | ICD-10-CM | POA: Diagnosis not present

## 2020-02-26 DIAGNOSIS — R051 Acute cough: Secondary | ICD-10-CM | POA: Diagnosis not present

## 2020-03-22 DIAGNOSIS — R051 Acute cough: Secondary | ICD-10-CM | POA: Diagnosis not present

## 2020-03-22 DIAGNOSIS — J0100 Acute maxillary sinusitis, unspecified: Secondary | ICD-10-CM | POA: Diagnosis not present

## 2020-03-22 DIAGNOSIS — M436 Torticollis: Secondary | ICD-10-CM | POA: Diagnosis not present

## 2020-05-23 DIAGNOSIS — M79641 Pain in right hand: Secondary | ICD-10-CM | POA: Diagnosis not present

## 2020-05-23 DIAGNOSIS — M6281 Muscle weakness (generalized): Secondary | ICD-10-CM | POA: Diagnosis not present

## 2020-05-23 DIAGNOSIS — I10 Essential (primary) hypertension: Secondary | ICD-10-CM | POA: Diagnosis not present

## 2020-05-23 DIAGNOSIS — E291 Testicular hypofunction: Secondary | ICD-10-CM | POA: Diagnosis not present

## 2020-05-23 DIAGNOSIS — H6500 Acute serous otitis media, unspecified ear: Secondary | ICD-10-CM | POA: Diagnosis not present

## 2020-05-23 DIAGNOSIS — F339 Major depressive disorder, recurrent, unspecified: Secondary | ICD-10-CM | POA: Diagnosis not present

## 2020-05-23 DIAGNOSIS — G9009 Other idiopathic peripheral autonomic neuropathy: Secondary | ICD-10-CM | POA: Diagnosis not present

## 2020-05-23 DIAGNOSIS — E782 Mixed hyperlipidemia: Secondary | ICD-10-CM | POA: Diagnosis not present

## 2020-05-23 DIAGNOSIS — F5221 Male erectile disorder: Secondary | ICD-10-CM | POA: Diagnosis not present

## 2020-05-23 DIAGNOSIS — R0981 Nasal congestion: Secondary | ICD-10-CM | POA: Diagnosis not present

## 2020-05-23 DIAGNOSIS — Z23 Encounter for immunization: Secondary | ICD-10-CM | POA: Diagnosis not present

## 2020-05-23 DIAGNOSIS — R7303 Prediabetes: Secondary | ICD-10-CM | POA: Diagnosis not present

## 2020-05-27 DIAGNOSIS — R600 Localized edema: Secondary | ICD-10-CM | POA: Diagnosis not present

## 2020-05-27 DIAGNOSIS — M1000 Idiopathic gout, unspecified site: Secondary | ICD-10-CM | POA: Diagnosis not present

## 2020-05-27 DIAGNOSIS — R7301 Impaired fasting glucose: Secondary | ICD-10-CM | POA: Diagnosis not present

## 2020-05-27 DIAGNOSIS — E782 Mixed hyperlipidemia: Secondary | ICD-10-CM | POA: Diagnosis not present

## 2020-05-27 DIAGNOSIS — I10 Essential (primary) hypertension: Secondary | ICD-10-CM | POA: Diagnosis not present

## 2020-05-27 DIAGNOSIS — C61 Malignant neoplasm of prostate: Secondary | ICD-10-CM | POA: Diagnosis not present

## 2020-05-27 DIAGNOSIS — Z0001 Encounter for general adult medical examination with abnormal findings: Secondary | ICD-10-CM | POA: Diagnosis not present

## 2020-05-27 DIAGNOSIS — E291 Testicular hypofunction: Secondary | ICD-10-CM | POA: Diagnosis not present

## 2020-05-27 DIAGNOSIS — Z23 Encounter for immunization: Secondary | ICD-10-CM | POA: Diagnosis not present

## 2020-05-27 DIAGNOSIS — F331 Major depressive disorder, recurrent, moderate: Secondary | ICD-10-CM | POA: Diagnosis not present

## 2020-05-27 DIAGNOSIS — G4710 Hypersomnia, unspecified: Secondary | ICD-10-CM | POA: Diagnosis not present

## 2020-05-27 DIAGNOSIS — G4733 Obstructive sleep apnea (adult) (pediatric): Secondary | ICD-10-CM | POA: Diagnosis not present

## 2020-05-30 DIAGNOSIS — C61 Malignant neoplasm of prostate: Secondary | ICD-10-CM | POA: Diagnosis not present

## 2020-06-06 DIAGNOSIS — N5201 Erectile dysfunction due to arterial insufficiency: Secondary | ICD-10-CM | POA: Diagnosis not present

## 2020-06-06 DIAGNOSIS — N393 Stress incontinence (female) (male): Secondary | ICD-10-CM | POA: Diagnosis not present

## 2020-06-06 DIAGNOSIS — E291 Testicular hypofunction: Secondary | ICD-10-CM | POA: Diagnosis not present

## 2020-06-06 DIAGNOSIS — C61 Malignant neoplasm of prostate: Secondary | ICD-10-CM | POA: Diagnosis not present

## 2020-09-15 DIAGNOSIS — F418 Other specified anxiety disorders: Secondary | ICD-10-CM | POA: Diagnosis not present

## 2020-10-08 ENCOUNTER — Ambulatory Visit
Admission: EM | Admit: 2020-10-08 | Discharge: 2020-10-08 | Disposition: A | Discharge status: Home / Self Care | Payer: PPO | Attending: Family Medicine | Admitting: Family Medicine

## 2020-10-08 ENCOUNTER — Other Ambulatory Visit: Payer: Self-pay

## 2020-10-08 ENCOUNTER — Encounter: Payer: Self-pay | Admitting: Emergency Medicine

## 2020-10-08 DIAGNOSIS — J0140 Acute pansinusitis, unspecified: Secondary | ICD-10-CM | POA: Diagnosis not present

## 2020-10-08 DIAGNOSIS — J209 Acute bronchitis, unspecified: Secondary | ICD-10-CM

## 2020-10-08 MED ORDER — DEXAMETHASONE SODIUM PHOSPHATE 10 MG/ML IJ SOLN
10.0000 mg | Freq: Once | INTRAMUSCULAR | Status: AC
  Administered 2020-10-08: 10 mg via INTRAMUSCULAR

## 2020-10-08 MED ORDER — AMOXICILLIN-POT CLAVULANATE 875-125 MG PO TABS: 1.0000 | ORAL_TABLET | Freq: Two times a day (BID) | ORAL | 0 refills | Status: AC

## 2020-10-08 NOTE — ED Triage Notes (Signed)
Sinus drainage, pressure, productive cough x 2 weeks.

## 2020-10-08 NOTE — Discharge Instructions (Addendum)
I have sent in Augmentin for you to take twice a day for 7 days.  You have also received a steroid injection in the office today  Follow up with this office or with primary care if symptoms are persisting.  Follow up in the ER for high fever, trouble swallowing, trouble breathing, other concerning symptoms.

## 2020-10-08 NOTE — ED Provider Notes (Signed)
[facility]   [id number] 10/08/20 Arrival Time: 1607  PX:TGGY THROAT  SUBJECTIVE: History from: patient.  Darren Gay is a 70 y.o. male who presents with abrupt onset of nasal congestion, headache, cough, fatigue for the last 2 weeks. Denies sick exposure to Covid, strep, flu or mono, or precipitating event. Reports negative home Covid test last night. Report that his nose was running but over the last week, he has been having sinus pain, pressure and very little nasal discharge. Has negative history of Covid. Has had Covid vaccines and boosters. Has tried OTC allegra and tessalon perles without relief. Cough is worse with activity. Denies previous symptoms in the past.     Denies fever, chills, ear pain, rhinorrhea, wheezing, chest pain, nausea, rash, changes in bowel or bladder habits.     ROS: As per HPI.  All other pertinent ROS negative.     Past Medical History:  Diagnosis Date   Allergy    Arthritis    Depression    Gout    High cholesterol    borderline   Hx of adenomatous colonic polyps 03/29/2014   Hypertension    Sleep apnea    wears cpap   Substance abuse [facility])    Past Surgical History:  Procedure Laterality Date   ANKLE SURGERY     tendon repair   BUNIONECTOMY     CARPAL TUNNEL RELEASE Bilateral    COLONOSCOPY     KNEE ARTHROSCOPY  2010   LYMPHADENECTOMY Bilateral 06/21/2019   Procedure: LYMPHADENECTOMY, PELVIC;  Surgeon: Raynelle Bring, MD;  Location: [facility];  Service: Urology;  Laterality: Bilateral;   ROBOT ASSISTED LAPAROSCOPIC RADICAL PROSTATECTOMY N/A 06/21/2019   Procedure: XI ROBOTIC ASSISTED LAPAROSCOPIC RADICAL PROSTATECTOMY LEVEL 2;  Surgeon: Raynelle Bring, MD;  Location: [facility];  Service: Urology;  Laterality: N/A;   TONSILLECTOMY  1960   VASECTOMY  1991   Allergies  Allergen Reactions   Ciprofloxacin     Hx tendon rupture   Colchicine Other (See Comments)    makes me fatigue   No current facility-administered medications on  file prior to encounter.   Current Outpatient Medications on File Prior to Encounter  Medication Sig Dispense Refill   allopurinol (ZYLOPRIM) 100 MG tablet Take 200 mg by mouth daily.      amLODipine-valsartan (EXFORGE) 10-320 MG tablet Take 1 tablet by mouth daily.      buPROPion (WELLBUTRIN SR) 150 MG 12 hr tablet Take 150 mg by mouth daily.      CIALIS 20 MG tablet Take 10 mg by mouth daily as needed for erectile dysfunction.      Efinaconazole 10 % SOLN Apply 1 drop topically daily. 4 mL 11   fexofenadine (ALLEGRA) 180 MG tablet Take 180 mg by mouth as needed for allergies or rhinitis.     fluticasone (FLONASE) 50 MCG/ACT nasal spray Place 1 spray into both nostrils daily as needed for allergies.      modafinil (PROVIGIL) 200 MG tablet Take 200 mg by mouth daily.     sulfamethoxazole-trimethoprim (BACTRIM DS) 800-160 MG tablet Take 1 tablet by mouth 2 (two) times daily. Start the day prior to foley removal appointment 6 tablet 0   traMADol (ULTRAM) 50 MG tablet Take 1-2 tablets (50-100 mg total) by mouth every 6 (six) hours as needed for moderate pain or severe pain. 20 tablet 0   Social History   Socioeconomic History   Marital status: Married    Spouse name: Freda Munro  Number of children: 3   Years of education: 4y Trade   Highest education level: Not on file  Occupational History    Employer: [employer]    Comment: [employer]  Tobacco Use   Smoking status: Former    Packs/day: 2.00    Years: 14.00    Pack years: 28.00    Types: Cigarettes   Smokeless tobacco: Never  Vaping Use   Vaping Use: Never used  Substance and Sexual Activity   Alcohol use: Yes    Alcohol/week: 21.0 standard drinks    Types: 21 Cans of beer per week    Comment: 2-3 beers daily   Drug use: No   Sexual activity: Yes    Birth control/protection: None  Other Topics Concern   Not on file  Social History Narrative   Pt lives at home with his family.   Caffeine Use: 2 cups daily   Social  Determinants of Health   Financial Resource Strain: Not on file  Food Insecurity: Not on file  Transportation Needs: Not on file  Physical Activity: Not on file  Stress: Not on file  Social Connections: Not on file  Intimate Partner Violence: Not on file   Family History  Problem Relation Age of Onset   Skin cancer Father    Hypertension Father    Breast cancer Mother    Stomach cancer Mother    Pancreatic cancer Brother    Colon cancer Neg Hx    Esophageal cancer Neg Hx    Rectal cancer Neg Hx    Colon polyps Neg Hx     OBJECTIVE:  Vitals:   10/08/20 0952  BP: 130/74  Pulse: 91  Resp: 18  Temp: 100.3 F (37.9 C)  TempSrc: Tympanic  SpO2: 94%     General appearance: alert; appears fatigued, but nontoxic, speaking in full sentences and managing own secretions HEENT: NCAT; Ears: EACs clear, TMs pearly gray with visible cone of light, without erythema; Eyes: PERRL, EOMI grossly; Nose: no obvious rhinorrhea; Throat: oropharynx clear, tonsils 1+ and mildly erythematous without white tonsillar exudates, uvula midline; Sinuses: tender to palpation Neck: supple without LAD Lungs: wheezing to bilateral lower lobes; cough absent Heart: regular rate and rhythm.  Radial pulses 2+ symmetrical bilaterally Skin: warm and dry Psychological: alert and cooperative; normal mood and affect  LABS: No results found for this or any previous visit (from the past 24 hour(s)).   ASSESSMENT & PLAN:  1. Acute non-recurrent pansinusitis   2. Acute bronchitis, unspecified organism     Meds ordered this encounter  Medications   amoxicillin-clavulanate (AUGMENTIN) 875-125 MG tablet    Sig: Take 1 tablet by mouth 2 (two) times daily for 7 days.    Dispense:  14 tablet    Refill:  0    Order Specific Question:   Supervising Provider    Answer:   Chase Picket [[id number]]   dexamethasone (DECADRON) injection 10 mg    Acute Sinusitis Push fluids and get rest Prescribed Augmentin 875mg   twice daily for 7 days.   Decadron 10mg  IM in office today Take as directed and to completion.  Drink warm or cool liquids, use throat lozenges, or popsicles to help alleviate symptoms Take OTC ibuprofen or tylenol as needed for pain May use Zyrtec D and flonase to help alleviate symptoms Follow up with PCP if symptoms persist Return or go to ER if you have any new or worsening symptoms such as fever, chills, nausea, vomiting, worsening  sore throat, cough, abdominal pain, chest pain, changes in bowel or bladder habits.  Reviewed expectations re: course of current medical issues. Questions answered. Outlined signs and symptoms indicating need for more acute intervention. Patient verbalized understanding. After Visit Summary given.           Faustino Congress, NP 10/08/20 1014

## 2020-10-31 DIAGNOSIS — N529 Male erectile dysfunction, unspecified: Secondary | ICD-10-CM | POA: Insufficient documentation

## 2020-11-11 DIAGNOSIS — E782 Mixed hyperlipidemia: Secondary | ICD-10-CM | POA: Insufficient documentation

## 2020-11-11 DIAGNOSIS — R7303 Prediabetes: Secondary | ICD-10-CM | POA: Insufficient documentation

## 2020-11-25 DIAGNOSIS — R41840 Attention and concentration deficit: Secondary | ICD-10-CM | POA: Diagnosis not present

## 2020-11-25 DIAGNOSIS — R4189 Other symptoms and signs involving cognitive functions and awareness: Secondary | ICD-10-CM | POA: Diagnosis not present

## 2020-11-26 DIAGNOSIS — L821 Other seborrheic keratosis: Secondary | ICD-10-CM | POA: Diagnosis not present

## 2020-11-26 DIAGNOSIS — D2371 Other benign neoplasm of skin of right lower limb, including hip: Secondary | ICD-10-CM | POA: Diagnosis not present

## 2020-11-26 DIAGNOSIS — D225 Melanocytic nevi of trunk: Secondary | ICD-10-CM | POA: Diagnosis not present

## 2020-11-26 DIAGNOSIS — D1801 Hemangioma of skin and subcutaneous tissue: Secondary | ICD-10-CM | POA: Diagnosis not present

## 2021-01-12 DIAGNOSIS — E669 Obesity, unspecified: Secondary | ICD-10-CM | POA: Diagnosis not present

## 2021-01-12 DIAGNOSIS — R7303 Prediabetes: Secondary | ICD-10-CM | POA: Diagnosis not present

## 2021-01-12 DIAGNOSIS — Z8546 Personal history of malignant neoplasm of prostate: Secondary | ICD-10-CM | POA: Insufficient documentation

## 2021-01-12 DIAGNOSIS — E291 Testicular hypofunction: Secondary | ICD-10-CM | POA: Diagnosis not present

## 2021-01-12 DIAGNOSIS — F331 Major depressive disorder, recurrent, moderate: Secondary | ICD-10-CM | POA: Diagnosis not present

## 2021-01-12 DIAGNOSIS — F900 Attention-deficit hyperactivity disorder, predominantly inattentive type: Secondary | ICD-10-CM | POA: Insufficient documentation

## 2021-01-12 DIAGNOSIS — Z23 Encounter for immunization: Secondary | ICD-10-CM | POA: Diagnosis not present

## 2021-01-12 DIAGNOSIS — E782 Mixed hyperlipidemia: Secondary | ICD-10-CM | POA: Diagnosis not present

## 2021-01-12 DIAGNOSIS — Z0000 Encounter for general adult medical examination without abnormal findings: Secondary | ICD-10-CM | POA: Diagnosis not present

## 2021-01-21 DIAGNOSIS — C61 Malignant neoplasm of prostate: Secondary | ICD-10-CM | POA: Diagnosis not present

## 2021-01-28 DIAGNOSIS — N5201 Erectile dysfunction due to arterial insufficiency: Secondary | ICD-10-CM | POA: Diagnosis not present

## 2021-01-28 DIAGNOSIS — E291 Testicular hypofunction: Secondary | ICD-10-CM | POA: Diagnosis not present

## 2021-01-28 DIAGNOSIS — C61 Malignant neoplasm of prostate: Secondary | ICD-10-CM | POA: Diagnosis not present

## 2021-04-20 DIAGNOSIS — F900 Attention-deficit hyperactivity disorder, predominantly inattentive type: Secondary | ICD-10-CM | POA: Diagnosis not present

## 2021-07-29 DIAGNOSIS — Z23 Encounter for immunization: Secondary | ICD-10-CM | POA: Diagnosis not present

## 2021-07-29 DIAGNOSIS — F331 Major depressive disorder, recurrent, moderate: Secondary | ICD-10-CM | POA: Diagnosis not present

## 2021-07-29 DIAGNOSIS — N529 Male erectile dysfunction, unspecified: Secondary | ICD-10-CM | POA: Diagnosis not present

## 2021-07-29 DIAGNOSIS — R748 Abnormal levels of other serum enzymes: Secondary | ICD-10-CM | POA: Diagnosis not present

## 2021-07-29 DIAGNOSIS — F900 Attention-deficit hyperactivity disorder, predominantly inattentive type: Secondary | ICD-10-CM | POA: Diagnosis not present

## 2021-07-29 DIAGNOSIS — Z79899 Other long term (current) drug therapy: Secondary | ICD-10-CM | POA: Diagnosis not present

## 2021-09-15 DIAGNOSIS — C61 Malignant neoplasm of prostate: Secondary | ICD-10-CM | POA: Diagnosis not present

## 2021-09-17 DIAGNOSIS — D2261 Melanocytic nevi of right upper limb, including shoulder: Secondary | ICD-10-CM | POA: Diagnosis not present

## 2021-09-17 DIAGNOSIS — B078 Other viral warts: Secondary | ICD-10-CM | POA: Diagnosis not present

## 2021-09-17 DIAGNOSIS — D2272 Melanocytic nevi of left lower limb, including hip: Secondary | ICD-10-CM | POA: Diagnosis not present

## 2021-09-17 DIAGNOSIS — D1801 Hemangioma of skin and subcutaneous tissue: Secondary | ICD-10-CM | POA: Diagnosis not present

## 2021-09-17 DIAGNOSIS — L570 Actinic keratosis: Secondary | ICD-10-CM | POA: Diagnosis not present

## 2021-09-17 DIAGNOSIS — L821 Other seborrheic keratosis: Secondary | ICD-10-CM | POA: Diagnosis not present

## 2021-09-17 DIAGNOSIS — D225 Melanocytic nevi of trunk: Secondary | ICD-10-CM | POA: Diagnosis not present

## 2021-09-30 ENCOUNTER — Other Ambulatory Visit (HOSPITAL_COMMUNITY): Payer: Self-pay

## 2021-09-30 MED ORDER — AMPHETAMINE-DEXTROAMPHET ER 20 MG PO CP24
ORAL_CAPSULE | ORAL | 0 refills | Status: DC
  Filled 2021-09-30: qty 30, 30d supply, fill #0

## 2021-10-01 ENCOUNTER — Other Ambulatory Visit (HOSPITAL_COMMUNITY): Payer: Self-pay

## 2021-10-01 MED ORDER — SODIUM FLUORIDE 1.1 % DT PSTE
PASTE | DENTAL | 1 refills | Status: DC
  Filled 2022-03-22: qty 100, 30d supply, fill #0

## 2021-10-01 MED ORDER — TADALAFIL 20 MG PO TABS
ORAL_TABLET | ORAL | 2 refills | Status: DC
  Filled 2021-10-01: qty 20, 30d supply, fill #0

## 2021-10-01 MED ORDER — FLUOXETINE HCL 10 MG PO CAPS: ORAL_CAPSULE | ORAL | 1 refills | Status: DC

## 2021-10-03 ENCOUNTER — Other Ambulatory Visit (HOSPITAL_COMMUNITY): Payer: Self-pay

## 2021-10-03 MED ORDER — TESTOSTERONE 20.25 MG/ACT (1.62%) TD GEL
TRANSDERMAL | 3 refills | Status: DC
  Filled 2021-10-03: qty 75, 30d supply, fill #0

## 2021-10-05 ENCOUNTER — Other Ambulatory Visit (HOSPITAL_COMMUNITY): Payer: Self-pay

## 2021-10-06 DIAGNOSIS — N5201 Erectile dysfunction due to arterial insufficiency: Secondary | ICD-10-CM | POA: Diagnosis not present

## 2021-10-06 DIAGNOSIS — C61 Malignant neoplasm of prostate: Secondary | ICD-10-CM | POA: Diagnosis not present

## 2021-10-14 ENCOUNTER — Other Ambulatory Visit (HOSPITAL_COMMUNITY): Payer: Self-pay

## 2021-10-23 ENCOUNTER — Other Ambulatory Visit (HOSPITAL_COMMUNITY): Payer: Self-pay

## 2021-10-23 ENCOUNTER — Ambulatory Visit (INDEPENDENT_AMBULATORY_CARE_PROVIDER_SITE_OTHER): Payer: PPO

## 2021-10-23 ENCOUNTER — Ambulatory Visit: Payer: PPO | Admitting: Podiatry

## 2021-10-23 DIAGNOSIS — S90812A Abrasion, left foot, initial encounter: Secondary | ICD-10-CM

## 2021-10-23 DIAGNOSIS — S9032XA Contusion of left foot, initial encounter: Secondary | ICD-10-CM

## 2021-10-23 DIAGNOSIS — L03116 Cellulitis of left lower limb: Secondary | ICD-10-CM

## 2021-10-23 DIAGNOSIS — M21612 Bunion of left foot: Secondary | ICD-10-CM

## 2021-10-23 MED ORDER — CEPHALEXIN 500 MG PO CAPS
500.0000 mg | ORAL_CAPSULE | Freq: Three times a day (TID) | ORAL | 0 refills | Status: DC
  Filled 2021-10-23: qty 21, 7d supply, fill #0

## 2021-10-25 NOTE — Progress Notes (Signed)
Subjective:   Patient ID: [name], male   DOB: 71 y.o.   MRN: [id number]   HPI 71 year old male presents to the office today for concerns of left foot injury which occurred on 10/20/2021 He states he dropped a hot water heater on the top of the foot.  He states he had soreness but over the last day or 2 has gotten worse.  He had an abrasion top of the foot got more sore as well.  He states that even where the surgery was done at previous and the scarring for the tendon repair that is also sore.  He denies any recent treatment.  No other concerns.   Review of Systems  All other systems reviewed and are negative.  Past Medical History:  Diagnosis Date   Allergy    Arthritis    Depression    Gout    High cholesterol    borderline   Hx of adenomatous colonic polyps 03/29/2014   Hypertension    Sleep apnea    wears cpap   Substance abuse [facility])     Past Surgical History:  Procedure Laterality Date   ANKLE SURGERY     tendon repair   BUNIONECTOMY     CARPAL TUNNEL RELEASE Bilateral    COLONOSCOPY     KNEE ARTHROSCOPY  2010   LYMPHADENECTOMY Bilateral 06/21/2019   Procedure: LYMPHADENECTOMY, PELVIC;  Surgeon: Raynelle Bring, MD;  Location: [facility];  Service: Urology;  Laterality: Bilateral;   ROBOT ASSISTED LAPAROSCOPIC RADICAL PROSTATECTOMY N/A 06/21/2019   Procedure: XI ROBOTIC ASSISTED LAPAROSCOPIC RADICAL PROSTATECTOMY LEVEL 2;  Surgeon: Raynelle Bring, MD;  Location: [facility];  Service: Urology;  Laterality: N/A;   TONSILLECTOMY  1960   VASECTOMY  1991     Current Outpatient Medications:    cephALEXin (KEFLEX) 500 MG capsule, Take 1 capsule by mouth 3 times daily., Disp: 21 capsule, Rfl: 0   allopurinol (ZYLOPRIM) 100 MG tablet, Take 200 mg by mouth daily. , Disp: , Rfl:    amLODipine-valsartan (EXFORGE) 10-320 MG tablet, Take 1 tablet by mouth daily. , Disp: , Rfl:    amphetamine-dextroamphetamine (ADDERALL XR) 20 MG 24 hr capsule, Take one capsule (20 mg dose) by mouth  every morning, Disp: 30 capsule, Rfl: 0   buPROPion (WELLBUTRIN SR) 150 MG 12 hr tablet, Take 150 mg by mouth daily. , Disp: , Rfl:    CIALIS 20 MG tablet, Take 10 mg by mouth daily as needed for erectile dysfunction. , Disp: , Rfl:    Efinaconazole 10 % SOLN, Apply 1 drop topically daily., Disp: 4 mL, Rfl: 11   fexofenadine (ALLEGRA) 180 MG tablet, Take 180 mg by mouth as needed for allergies or rhinitis., Disp: , Rfl:    FLUoxetine (PROZAC) 10 MG capsule, Take 1 capsule by mouth daily, Disp: 30 capsule, Rfl: 1   fluticasone (FLONASE) 50 MCG/ACT nasal spray, Place 1 spray into both nostrils daily as needed for allergies. , Disp: , Rfl:    modafinil (PROVIGIL) 200 MG tablet, Take 200 mg by mouth daily., Disp: , Rfl:    Sodium Fluoride 1.1 % PSTE, Apply to toothbrush at bedtime as directed, Disp: 100 mL, Rfl: 1   sulfamethoxazole-trimethoprim (BACTRIM DS) 800-160 MG tablet, Take 1 tablet by mouth 2 (two) times daily. Start the day prior to foley removal appointment, Disp: 6 tablet, Rfl: 0   tadalafil (CIALIS) 20 MG tablet, Take 1 tablet by mouth daily as needed for erectile dysfunction, Disp: 20 tablet, Rfl:  2   Testosterone 20.25 MG/ACT (1.62%) GEL, Place 2 pumps onto the skin daily, Disp: 75 g, Rfl: 3   traMADol (ULTRAM) 50 MG tablet, Take 1-2 tablets (50-100 mg total) by mouth every 6 (six) hours as needed for moderate pain or severe pain., Disp: 20 tablet, Rfl: 0  Allergies  Allergen Reactions   Ciprofloxacin     Hx tendon rupture   Colchicine Other (See Comments)    makes me fatigue          Objective:  Physical Exam  General: AAO x3, NAD  Dermatological: Superficial abrasion noted on the dorsal aspect of the left foot there is localized erythema without any drainage or pus.  No fluctuance or crepitation.  There is no moderate.  No other open lesions.  Scars from prior surgery well-healed.     Vascular: Dorsalis Pedis artery and Posterior Tibial artery pedal pulses are 2/4  bilateral with immedate capillary fill time. There is no pain with calf compression, swelling, warmth, erythema.   Neruologic: Grossly intact via light touch bilateral.    Musculoskeletal: Tenderness to the dorsal aspect the midfoot.  There is no specific area pinpoint tenderness.  Clinically the flexor, extensor tendons appear to be intact.  There is some edema present.  Muscular strength 5/5 in all groups tested bilateral.  Gait: Unassisted, Nonantalgic.       Assessment:   Contusion left foot, abrasion with localized erythema     Plan:  -Treatment options discussed including all alternatives, risks, and complications. -Etiology of symptoms were discussed -X-rays were obtained and reviewed with the patient.  Arthritic changes present the midfoot that he obvious signs of acute fracture. -Recommend mobilization in cam boot.  He already has one at home. -Ice, elevation, limited activity. -Keflex -Monitor for any clinical signs or symptoms of infection and directed to call the office immediately should any occur or go to the ER.  Return in about 4 weeks (around 11/20/2021), or if symptoms worsen or fail to improve.  Trula Slade DPM

## 2021-10-28 ENCOUNTER — Other Ambulatory Visit (HOSPITAL_COMMUNITY): Payer: Self-pay

## 2021-10-28 DIAGNOSIS — F900 Attention-deficit hyperactivity disorder, predominantly inattentive type: Secondary | ICD-10-CM | POA: Diagnosis not present

## 2021-10-28 DIAGNOSIS — F331 Major depressive disorder, recurrent, moderate: Secondary | ICD-10-CM | POA: Diagnosis not present

## 2021-10-28 MED ORDER — AMPHETAMINE-DEXTROAMPHET ER 20 MG PO CP24
ORAL_CAPSULE | ORAL | 0 refills | Status: DC
Start: 2021-10-28 — End: 2021-11-24
  Filled 2021-10-28: qty 30, 30d supply, fill #0

## 2021-10-28 MED ORDER — FLUOXETINE HCL 20 MG PO CAPS
ORAL_CAPSULE | ORAL | 2 refills | Status: DC
  Filled 2021-10-28: qty 30, 30d supply, fill #0

## 2021-10-30 ENCOUNTER — Other Ambulatory Visit (HOSPITAL_COMMUNITY): Payer: Self-pay

## 2021-11-03 ENCOUNTER — Other Ambulatory Visit (HOSPITAL_COMMUNITY): Payer: Self-pay

## 2021-11-24 ENCOUNTER — Other Ambulatory Visit (HOSPITAL_COMMUNITY): Payer: Self-pay

## 2021-11-24 MED ORDER — TESTOSTERONE 1.62 % TD GEL
TRANSDERMAL | 3 refills | Status: DC
  Filled 2021-11-24: qty 75, 30d supply, fill #0

## 2021-11-24 MED ORDER — AMPHETAMINE-DEXTROAMPHET ER 20 MG PO CP24
ORAL_CAPSULE | ORAL | 0 refills | Status: DC
  Filled 2021-11-26: qty 30, 30d supply, fill #0

## 2021-11-24 MED ORDER — FLUOXETINE HCL 20 MG PO CAPS
ORAL_CAPSULE | ORAL | 2 refills | Status: AC
  Filled 2021-11-24: qty 30, 30d supply, fill #0
  Filled 2021-12-21: qty 30, 30d supply, fill #1
  Filled 2022-03-22: qty 30, 30d supply, fill #2

## 2021-11-24 MED ORDER — TADALAFIL 20 MG PO TABS
ORAL_TABLET | ORAL | 2 refills | Status: DC
  Filled 2021-11-24: qty 20, 20d supply, fill #0
  Filled 2021-12-21: qty 20, 20d supply, fill #1

## 2021-11-26 ENCOUNTER — Other Ambulatory Visit (HOSPITAL_COMMUNITY): Payer: Self-pay

## 2021-12-21 ENCOUNTER — Other Ambulatory Visit (HOSPITAL_COMMUNITY): Payer: Self-pay

## 2021-12-21 MED ORDER — AMLODIPINE BESYLATE-VALSARTAN 10-320 MG PO TABS
1.0000 | ORAL_TABLET | Freq: Every day | ORAL | 2 refills | Status: DC
  Filled 2021-12-21: qty 30, 30d supply, fill #0
  Filled 2022-03-22: qty 30, 30d supply, fill #1

## 2021-12-21 MED ORDER — AMPHETAMINE-DEXTROAMPHET ER 20 MG PO CP24
20.0000 mg | ORAL_CAPSULE | Freq: Every morning | ORAL | 0 refills | Status: DC
  Filled 2021-12-21 – 2021-12-24 (×2): qty 30, 30d supply, fill #0

## 2021-12-22 ENCOUNTER — Other Ambulatory Visit (HOSPITAL_COMMUNITY): Payer: Self-pay

## 2021-12-23 ENCOUNTER — Other Ambulatory Visit (HOSPITAL_COMMUNITY): Payer: Self-pay

## 2021-12-24 ENCOUNTER — Other Ambulatory Visit (HOSPITAL_COMMUNITY): Payer: Self-pay

## 2022-01-26 DIAGNOSIS — Z8546 Personal history of malignant neoplasm of prostate: Secondary | ICD-10-CM | POA: Diagnosis not present

## 2022-01-26 DIAGNOSIS — I4891 Unspecified atrial fibrillation: Secondary | ICD-10-CM | POA: Insufficient documentation

## 2022-01-26 DIAGNOSIS — R7303 Prediabetes: Secondary | ICD-10-CM | POA: Diagnosis not present

## 2022-01-26 DIAGNOSIS — Z0000 Encounter for general adult medical examination without abnormal findings: Secondary | ICD-10-CM | POA: Diagnosis not present

## 2022-01-26 DIAGNOSIS — E782 Mixed hyperlipidemia: Secondary | ICD-10-CM | POA: Diagnosis not present

## 2022-01-26 DIAGNOSIS — Z23 Encounter for immunization: Secondary | ICD-10-CM | POA: Diagnosis not present

## 2022-01-26 DIAGNOSIS — F331 Major depressive disorder, recurrent, moderate: Secondary | ICD-10-CM | POA: Diagnosis not present

## 2022-01-28 DIAGNOSIS — R0600 Dyspnea, unspecified: Secondary | ICD-10-CM | POA: Diagnosis not present

## 2022-01-28 DIAGNOSIS — I480 Paroxysmal atrial fibrillation: Secondary | ICD-10-CM | POA: Diagnosis not present

## 2022-01-28 DIAGNOSIS — E785 Hyperlipidemia, unspecified: Secondary | ICD-10-CM | POA: Diagnosis not present

## 2022-01-28 DIAGNOSIS — G4733 Obstructive sleep apnea (adult) (pediatric): Secondary | ICD-10-CM | POA: Diagnosis not present

## 2022-01-28 DIAGNOSIS — R7303 Prediabetes: Secondary | ICD-10-CM | POA: Diagnosis not present

## 2022-01-28 DIAGNOSIS — I10 Essential (primary) hypertension: Secondary | ICD-10-CM | POA: Diagnosis not present

## 2022-02-02 DIAGNOSIS — I480 Paroxysmal atrial fibrillation: Secondary | ICD-10-CM | POA: Diagnosis not present

## 2022-02-02 DIAGNOSIS — I4891 Unspecified atrial fibrillation: Secondary | ICD-10-CM | POA: Diagnosis not present

## 2022-02-02 DIAGNOSIS — I10 Essential (primary) hypertension: Secondary | ICD-10-CM | POA: Diagnosis not present

## 2022-02-11 ENCOUNTER — Other Ambulatory Visit (HOSPITAL_COMMUNITY): Payer: Self-pay

## 2022-02-11 MED ORDER — AMPHETAMINE-DEXTROAMPHET ER 20 MG PO CP24
20.0000 mg | ORAL_CAPSULE | Freq: Every morning | ORAL | 0 refills | Status: DC
  Filled 2022-02-11 – 2022-03-22 (×2): qty 30, 30d supply, fill #0

## 2022-02-16 DIAGNOSIS — I4891 Unspecified atrial fibrillation: Secondary | ICD-10-CM | POA: Diagnosis not present

## 2022-03-01 DIAGNOSIS — M25561 Pain in right knee: Secondary | ICD-10-CM | POA: Diagnosis not present

## 2022-03-01 DIAGNOSIS — M25551 Pain in right hip: Secondary | ICD-10-CM | POA: Diagnosis not present

## 2022-03-12 DIAGNOSIS — I480 Paroxysmal atrial fibrillation: Secondary | ICD-10-CM | POA: Diagnosis not present

## 2022-03-16 DIAGNOSIS — I480 Paroxysmal atrial fibrillation: Secondary | ICD-10-CM | POA: Diagnosis not present

## 2022-03-16 DIAGNOSIS — R0600 Dyspnea, unspecified: Secondary | ICD-10-CM | POA: Diagnosis not present

## 2022-03-22 ENCOUNTER — Other Ambulatory Visit (HOSPITAL_COMMUNITY): Payer: Self-pay

## 2022-03-26 ENCOUNTER — Other Ambulatory Visit (HOSPITAL_COMMUNITY): Payer: Self-pay

## 2022-03-26 DIAGNOSIS — R4189 Other symptoms and signs involving cognitive functions and awareness: Secondary | ICD-10-CM | POA: Diagnosis not present

## 2022-03-26 DIAGNOSIS — R42 Dizziness and giddiness: Secondary | ICD-10-CM | POA: Diagnosis not present

## 2022-03-26 DIAGNOSIS — R748 Abnormal levels of other serum enzymes: Secondary | ICD-10-CM | POA: Diagnosis not present

## 2022-03-26 DIAGNOSIS — R053 Chronic cough: Secondary | ICD-10-CM | POA: Diagnosis not present

## 2022-03-29 ENCOUNTER — Other Ambulatory Visit (HOSPITAL_COMMUNITY): Payer: Self-pay | Admitting: Adult Health Nurse Practitioner

## 2022-03-29 DIAGNOSIS — R4189 Other symptoms and signs involving cognitive functions and awareness: Secondary | ICD-10-CM

## 2022-03-29 DIAGNOSIS — R42 Dizziness and giddiness: Secondary | ICD-10-CM

## 2022-03-30 DIAGNOSIS — I10 Essential (primary) hypertension: Secondary | ICD-10-CM | POA: Diagnosis not present

## 2022-03-30 DIAGNOSIS — E669 Obesity, unspecified: Secondary | ICD-10-CM | POA: Diagnosis not present

## 2022-03-30 DIAGNOSIS — Z6830 Body mass index (BMI) 30.0-30.9, adult: Secondary | ICD-10-CM | POA: Diagnosis not present

## 2022-03-30 DIAGNOSIS — G4730 Sleep apnea, unspecified: Secondary | ICD-10-CM | POA: Diagnosis not present

## 2022-03-30 DIAGNOSIS — I4819 Other persistent atrial fibrillation: Secondary | ICD-10-CM | POA: Diagnosis not present

## 2022-04-09 DIAGNOSIS — F900 Attention-deficit hyperactivity disorder, predominantly inattentive type: Secondary | ICD-10-CM | POA: Diagnosis not present

## 2022-04-09 DIAGNOSIS — R053 Chronic cough: Secondary | ICD-10-CM | POA: Diagnosis not present

## 2022-04-09 DIAGNOSIS — I4891 Unspecified atrial fibrillation: Secondary | ICD-10-CM | POA: Diagnosis not present

## 2022-04-15 ENCOUNTER — Ambulatory Visit (HOSPITAL_COMMUNITY)
Admission: RE | Admit: 2022-04-15 | Discharge: 2022-04-15 | Disposition: A | Discharge status: Home / Self Care | Payer: PPO | Source: Ambulatory Visit | Attending: Adult Health Nurse Practitioner | Admitting: Adult Health Nurse Practitioner

## 2022-04-15 DIAGNOSIS — R42 Dizziness and giddiness: Secondary | ICD-10-CM | POA: Insufficient documentation

## 2022-04-15 DIAGNOSIS — R4189 Other symptoms and signs involving cognitive functions and awareness: Secondary | ICD-10-CM | POA: Diagnosis present

## 2022-04-23 ENCOUNTER — Telehealth: Payer: Self-pay | Admitting: Podiatry

## 2022-04-23 NOTE — Telephone Encounter (Signed)
Pt called in today at 12:20pm, states he injured his foot and hit it on a piece of wood. He states its painful, swollen, black and bruised. He wanted to be seen today with Dr Jacqualyn Posey. No available appts. Pt stated that he will ice it and call back if it gets worse.

## 2022-04-26 ENCOUNTER — Other Ambulatory Visit (HOSPITAL_COMMUNITY): Payer: Self-pay | Admitting: Adult Health Nurse Practitioner

## 2022-04-26 ENCOUNTER — Ambulatory Visit (HOSPITAL_COMMUNITY)
Admission: RE | Admit: 2022-04-26 | Discharge: 2022-04-26 | Disposition: A | Discharge status: Home / Self Care | Payer: PPO | Source: Ambulatory Visit | Attending: Adult Health Nurse Practitioner | Admitting: Adult Health Nurse Practitioner

## 2022-04-26 DIAGNOSIS — L03032 Cellulitis of left toe: Secondary | ICD-10-CM | POA: Diagnosis not present

## 2022-04-29 ENCOUNTER — Ambulatory Visit: Payer: PPO | Admitting: Podiatry

## 2022-04-30 DIAGNOSIS — R4189 Other symptoms and signs involving cognitive functions and awareness: Secondary | ICD-10-CM | POA: Insufficient documentation

## 2022-05-06 ENCOUNTER — Ambulatory Visit (INDEPENDENT_AMBULATORY_CARE_PROVIDER_SITE_OTHER): Payer: PPO | Admitting: Podiatry

## 2022-05-06 DIAGNOSIS — S9032XA Contusion of left foot, initial encounter: Secondary | ICD-10-CM | POA: Diagnosis not present

## 2022-05-06 DIAGNOSIS — L97521 Non-pressure chronic ulcer of other part of left foot limited to breakdown of skin: Secondary | ICD-10-CM

## 2022-05-06 NOTE — Patient Instructions (Signed)
Wash with soap and water daily, dry well. Use silvadene and cover with silvadene and a bandage  Monitor for any signs/symptoms of infection. Call the office immediately if any occur or go directly to the emergency room. Call with any questions/concerns.

## 2022-05-06 NOTE — Progress Notes (Signed)
Subjective:  DOI 04/21/2022- walking around the yard wearing sandlas and he kicked something  72 year old male presents the office today for concerns of injury to his left foot.  Started after he kicked something and developed a wound.  He states that the wound was opening well but is starting to heal and scab over.  Did not see any drainage or pus.  No swelling to the big toe.  Saw PCP on doxycyline currently   Objective: AAO x3, NAD DP/PT pulses palpable bilaterally, CRT less than 3 seconds Sensation decreased. On the dorsal aspect left hallux there is what appears to be old dried blister, callus formation which I was able to debride today.  There is new, pink skin present underneath.  There is edema present to the toe.  There is no drainage or pus.  There is no fluctuance or crepitation but there is no malodor.  See pictures below. No pain with calf compression, swelling, warmth, erythema       Assessment: Injury left hallux  Plan: -All treatment options discussed with the patient including all alternatives, risks, complications.  -Independent review the x-rays that were done on January 15.  Wound is healing.  Continue doxycycline.  I ordered Silvadene cream to apply to the wound daily.  Offloading.  He needs to monitor closely for signs or symptoms of infection. -Patient encouraged to call the office with any questions, concerns, change in symptoms.    Trula Slade DPM

## 2022-06-24 ENCOUNTER — Encounter: Payer: Self-pay | Admitting: Diagnostic Neuroimaging

## 2022-06-24 ENCOUNTER — Ambulatory Visit: Payer: PPO | Admitting: Diagnostic Neuroimaging

## 2022-06-24 VITALS — BP 144/88 | HR 92 | Ht 72.0 in | Wt 217.0 lb

## 2022-06-24 DIAGNOSIS — G3184 Mild cognitive impairment, so stated: Secondary | ICD-10-CM | POA: Diagnosis not present

## 2022-06-24 NOTE — Progress Notes (Signed)
[facility]  PATIENT: Darren Gay DOB: 05/03/1950  REFERRING CLINICIAN: Pablo Lawrence, NP HISTORY FROM: patient  REASON FOR VISIT: new consult    HISTORICAL  CHIEF COMPLAINT:  Chief Complaint  Patient presents with   New Patient (Initial Visit)    Patient in room #7 with his wife. Patient states he been having some memory issues.    HISTORY OF PRESENT ILLNESS:   72 year old male here for evaluation of memory and cognitive difficulties.  Patient has had some mild issues since 2021 with some difficulty in short-term and long-term recall events.  When confronted with these discrepancies he had become somewhat frustrated and agitated.  Has been having some issues with hearing loss and pay attention.  Has some long-term issues related to depression and decreased motivation, energy, anhedonia.  Has been on Wellbutrin and Prozac in the past.  Symptoms somewhat more prominent in the last 1 year.  This has caused some strain at home.  He still able to maintain his ADLs.  He does note some more difficulty with driving and multitasking compared to previously.  Went to PCP for evaluation.  Patient was tried on Adderall without benefit.  Also started on memantine.   REVIEW OF SYSTEMS: Full 14 system review of systems performed and negative with exception of: as per HPI.  ALLERGIES: Allergies  Allergen Reactions   Ciprofloxacin     Hx tendon rupture   Colchicine Other (See Comments)    makes me fatigue    HOME MEDICATIONS: Outpatient Medications Prior to Visit  Medication Sig Dispense Refill   allopurinol (ZYLOPRIM) 100 MG tablet Take 200 mg by mouth daily.      amLODipine (NORVASC) 10 MG tablet Take 10 mg by mouth daily.     amphetamine-dextroamphetamine (ADDERALL XR) 20 MG 24 hr capsule Take 1 capsule (20 mg dose) by mouth every morning. 30 capsule 0   Ascorbic Acid (VITAMIN C) 1000 MG tablet Take 1,000 mg by mouth daily.     carvedilol (COREG) 6.25 MG  tablet Take 6.25 mg by mouth 2 (two) times daily with a meal.     doxycycline (MONODOX) 100 MG capsule Take 100 mg by mouth 2 (two) times daily.     ELIQUIS 5 MG TABS tablet Take 5 mg by mouth 2 (two) times daily.     fexofenadine (ALLEGRA) 180 MG tablet Take 180 mg by mouth as needed for allergies or rhinitis.     FLUoxetine (PROZAC) 20 MG capsule Take 1 capsule by mouth daily 30 capsule 2   fluticasone (FLONASE) 50 MCG/ACT nasal spray Place 1 spray into both nostrils daily as needed for allergies.      gabapentin (NEURONTIN) 300 MG capsule Take 300 mg by mouth as needed.     methocarbamol (ROBAXIN) 500 MG tablet Take 500 mg by mouth 3 (three) times daily.     omeprazole (PRILOSEC) 40 MG capsule Take 40 mg by mouth daily.     Sodium Fluoride 1.1 % PSTE Apply to toothbrush at bedtime as directed 100 mL 1   tadalafil (CIALIS) 20 MG tablet Take 1 tablet by mouth daily as needed for Erectile Dysfunction. 20 tablet 2   memantine (NAMENDA) 5 MG tablet Take 5 mg by mouth 2 (two) times daily.     amLODipine-valsartan (EXFORGE) 10-320 MG tablet Take 1 tablet by mouth daily.      amLODipine-valsartan (EXFORGE) 10-320 MG tablet Take 1 tablet by mouth daily. 30 tablet 2   buPROPion [facility] SR)  150 MG 12 hr tablet Take 150 mg by mouth daily.      cephALEXin (KEFLEX) 500 MG capsule Take 1 capsule by mouth 3 times daily. 21 capsule 0   CIALIS 20 MG tablet Take 10 mg by mouth daily as needed for erectile dysfunction.      Efinaconazole 10 % SOLN Apply 1 drop topically daily. 4 mL 11   FLUoxetine (PROZAC) 10 MG capsule Take 1 capsule by mouth daily 30 capsule 1   FLUoxetine (PROZAC) 20 MG capsule Take one capsule (20 mg dose) by mouth daily. 30 capsule 2   modafinil (PROVIGIL) 200 MG tablet Take 200 mg by mouth daily.     sulfamethoxazole-trimethoprim (BACTRIM DS) 800-160 MG tablet Take 1 tablet by mouth 2 (two) times daily. Start the day prior to foley removal appointment 6 tablet 0   tadalafil (CIALIS)  20 MG tablet Take 1 tablet by mouth daily as needed for erectile dysfunction 20 tablet 2   Testosterone 1.62 % GEL Place 2 pumps onto the skin daily 75 g 3   Testosterone 20.25 MG/ACT (1.62%) GEL Place 2 pumps onto the skin daily 75 g 3   traMADol (ULTRAM) 50 MG tablet Take 1-2 tablets (50-100 mg total) by mouth every 6 (six) hours as needed for moderate pain or severe pain. 20 tablet 0   No facility-administered medications prior to visit.    PAST MEDICAL HISTORY: Past Medical History:  Diagnosis Date   Allergy    Arthritis    Depression    Gout    High cholesterol    borderline   Hx of adenomatous colonic polyps 03/29/2014   Hypertension    Sleep apnea    wears cpap   Substance abuse ([place])     PAST SURGICAL HISTORY: Past Surgical History:  Procedure Laterality Date   ANKLE SURGERY     tendon repair   BUNIONECTOMY     CARPAL TUNNEL RELEASE Bilateral    COLONOSCOPY     KNEE ARTHROSCOPY  2010   LYMPHADENECTOMY Bilateral 06/21/2019   Procedure: LYMPHADENECTOMY, PELVIC;  Surgeon: Raynelle Bring, MD;  Location: [facility];  Service: Urology;  Laterality: Bilateral;   ROBOT ASSISTED LAPAROSCOPIC RADICAL PROSTATECTOMY N/A 06/21/2019   Procedure: XI ROBOTIC ASSISTED LAPAROSCOPIC RADICAL PROSTATECTOMY LEVEL 2;  Surgeon: Raynelle Bring, MD;  Location: [facility];  Service: Urology;  Laterality: N/A;   TONSILLECTOMY  1960   VASECTOMY  1991    FAMILY HISTORY: Family History  Problem Relation Age of Onset   Skin cancer Father    Hypertension Father    Breast cancer Mother    Stomach cancer Mother    Pancreatic cancer Brother    Colon cancer Neg Hx    Esophageal cancer Neg Hx    Rectal cancer Neg Hx    Colon polyps Neg Hx     SOCIAL HISTORY: Social History   Socioeconomic History   Marital status: Married    Spouse name: Freda Munro   Number of children: 3   Years of education: 4y Trade   Highest education level: Not on file  Occupational History    Employer: [employer]     Comment: [employer]  Tobacco Use   Smoking status: Former    Packs/day: 2.00    Years: 14.00    Additional pack years: 0.00    Total pack years: 28.00    Types: Cigarettes   Smokeless tobacco: Never  Vaping Use   Vaping Use: Never used  Substance and Sexual  Activity   Alcohol use: Yes    Alcohol/week: 21.0 standard drinks of alcohol    Types: 21 Cans of beer per week    Comment: 2-3 beers daily   Drug use: No   Sexual activity: Yes    Birth control/protection: None  Other Topics Concern   Not on file  Social History Narrative   Pt lives at home with his family.   Caffeine Use: 2 cups daily   Social Determinants of Health   Financial Resource Strain: Not on file  Food Insecurity: Not on file  Transportation Needs: Not on file  Physical Activity: Not on file  Stress: Not on file  Social Connections: Not on file  Intimate Partner Violence: Not on file     PHYSICAL EXAM  GENERAL EXAM/CONSTITUTIONAL: Vitals:  Vitals:   06/24/22 1338  BP: (!) 144/88  Pulse: 92  Weight: 217 lb (98.4 kg)  Height: 6' (1.829 m)   Body mass index is 29.43 kg/m. Wt Readings from Last 3 Encounters:  06/24/22 217 lb (98.4 kg)  06/22/19 219 lb 2.2 oz (99.4 kg)  06/18/19 219 lb 3.2 oz (99.4 kg)   Patient is in no distress; well developed, nourished and groomed; neck is supple  CARDIOVASCULAR: Examination of carotid arteries is normal; no carotid bruits Regular rate and rhythm, no murmurs Examination of peripheral vascular system by observation and palpation is normal  EYES: Ophthalmoscopic exam of optic discs and posterior segments is normal; no papilledema or hemorrhages No results found.  MUSCULOSKELETAL: Gait, strength, tone, movements noted in Neurologic exam below  NEUROLOGIC: MENTAL STATUS:     06/24/2022    1:41 PM  MMSE - Mini Mental State Exam  Orientation to time 5  Orientation to Place 5  Registration 3  Attention/ Calculation 5  Recall 3  Language- name  2 objects 2  Language- repeat 1  Language- follow 3 step command 3  Language- read & follow direction 1  Write a sentence 1  Copy design 1  Total score 30   awake, alert, oriented to person, place and time recent and remote memory intact normal attention and concentration language fluent, comprehension intact, naming intact fund of knowledge appropriate  CRANIAL NERVE:  2nd - no papilledema on fundoscopic exam 2nd, 3rd, 4th, 6th - pupils equal and reactive to light, visual fields full to confrontation, extraocular muscles intact, no nystagmus 5th - facial sensation symmetric 7th - facial strength symmetric 8th - hearing intact 9th - palate elevates symmetrically, uvula midline 11th - shoulder shrug symmetric 12th - tongue protrusion midline  MOTOR:  normal bulk and tone, full strength in the BUE, BLE  SENSORY:  normal and symmetric to light touch, temperature, vibration  COORDINATION:  finger-nose-finger, fine finger movements normal  REFLEXES:  deep tendon reflexes TRACE and symmetric  GAIT/STATION:  narrow based gait     DIAGNOSTIC DATA (LABS, IMAGING, TESTING) - I reviewed patient records, labs, notes, testing and imaging myself where available.  Lab Results  Component Value Date   WBC 6.3 06/18/2019   HGB 11.1 (L) 06/22/2019   HCT 33.0 (L) 06/22/2019   MCV 91.9 06/18/2019   PLT 208 06/18/2019      Component Value Date/Time   NA 141 06/18/2019 1343   K 4.4 06/18/2019 1343   CL 106 06/18/2019 1343   CO2 28 06/18/2019 1343   GLUCOSE 144 (H) 06/18/2019 1343   BUN 19 06/18/2019 1343   CREATININE 0.99 06/18/2019 1343   CREATININE 1.08 10/29/2014  1426   CALCIUM 9.4 06/18/2019 1343   PROT 7.5 10/29/2014 1426   ALBUMIN 4.3 10/29/2014 1426   AST 28 10/29/2014 1426   ALT 32 10/29/2014 1426   ALKPHOS 68 10/29/2014 1426   BILITOT 0.4 10/29/2014 1426   GFRNONAA >60 06/18/2019 1343   GFRAA >60 06/18/2019 1343   Lab Results  Component Value Date   CHOL  224 (H) 06/04/2009   HDL 58.80 06/04/2009   LDLDIRECT 150.6 06/04/2009   TRIG 205.0 (H) 06/04/2009   CHOLHDL 4 06/04/2009   No results found for: "HGBA1C" No results found for: "VITAMINB12" Lab Results  Component Value Date   TSH 0.50 06/04/2009    04/15/22 CT head [I reviewed images myself and agree with interpretation. -VRP]  - no acute findings    ASSESSMENT AND PLAN  72 y.o. year old male here with:   Dx:  1. Mild cognitive impairment     PLAN:  BRAIN FOG, dizziness, decreased attention (MMSE 30/30; no changes in ADLs; could be related to underlying stress, low testosterone, OSA on CPAP, insomnia, depression) - recommend hearing testing and labs - no evidence of dementia at this time (do not recommend memantine) - not much benefit with ADHD meds - safety / supervision issues reviewed - daily physical activity / exercise (at least 15-30 minutes) - eat more plants / vegetables - increase social activities, brain stimulation, games, puzzles, hobbies, crafts, arts, music - aim for at least 7-8 hours sleep per night (or more) - avoid smoking and alcohol - caution with medications, finances, driving  Orders Placed This Encounter  Procedures   Vitamin B12   TSH Rfx on Abnormal to Free T4   Ambulatory referral to Audiology   Return for return to PCP, pending if symptoms worsen or fail to improve.  I spent 60 minutes of face-to-face and non-face-to-face time with patient.  This included previsit chart review, lab review, study review, order entry, electronic health record documentation, patient education.     Penni Bombard, MD [id number], [id number] PM Certified in Neurology, Neurophysiology and Neuroimaging  [facility] [address], [place], [place] [address] [phone]

## 2022-06-24 NOTE — Patient Instructions (Signed)
BRAIN FOG, dizziness, decreased attention (MMSE 30/30; no changes in ADLs; could be related to underlying stress, low testosterone, OSA on CPAP, insomnia, depression) - recommend hearing testing - no evidence of dementia at this time (do not recommend memantine) - not much benefit with ADHD meds - safety / supervision issues reviewed - daily physical activity / exercise (at least 15-30 minutes) - eat more plants / vegetables - increase social activities, brain stimulation, games, puzzles, hobbies, crafts, arts, music - aim for at least 7-8 hours sleep per night (or more) - avoid smoking and alcohol - caution with medications, finances, driving

## 2022-06-25 LAB — VITAMIN B12: Vitamin B-12: 1186 pg/mL (ref 232–1245)

## 2022-06-25 LAB — TSH RFX ON ABNORMAL TO FREE T4: TSH: 1.29 u[IU]/mL (ref 0.450–4.500)

## 2022-06-28 ENCOUNTER — Ambulatory Visit: Payer: PPO | Attending: Audiology | Admitting: Audiology

## 2022-06-28 DIAGNOSIS — H903 Sensorineural hearing loss, bilateral: Secondary | ICD-10-CM | POA: Diagnosis present

## 2022-06-29 NOTE — Procedures (Signed)
  Outpatient Audiology and [facility], [place]  [id number] [phone]  AUDIOLOGICAL  EVALUATION  NAME: Darren Gay     DOB:   23-May-1950      MRN: [id number]                                                                                     DATE: 06/29/2022     REFERENT: Pablo Lawrence, NP STATUS: Outpatient DIAGNOSIS: Sensorineural hearing loss, bilateral    History: [name] was seen for an audiological evaluation due to decreased hearing. [name] reports difficulty hearing in the presence of background noise. [name] reports difficulty communicating with his wife. [name] reports bilateral aural fullness, and intermittent tinnitus. The tinnitus is non-bothersome. [name] reports dizziness. [name] denies otalgia. He has a history of occupational noise exposure and reports utilizing hearing protection at his previous job. [name] has been diagnosed with Mild cognitive impairment.   Evaluation:  Otoscopy showed a clear view of the tympanic membranes, bilaterally Tympanometry could not be measured. A hermetic seal could not be maintained to measure tympanometry.  Audiometric testing was completed using Conventional Audiometry techniques with insert earphones and TDH headphones. Test results are consistent in the left ear with normal hearing sensitivity ([phone] Hz) sloping to a mild sensorineural hearing loss at 8000 Hz and in the right ear with normal hearing sensitivity ([id number]) Hz sloping to a mild sensorineural hearing loss 3000-8000 Hz . Speech Recognition Thresholds were obtained at 10 dB HL in the right ear and at 20  dB HL in the left ear. Word Recognition Testing was completed at 70 dB HL and [name] scored 96%, bilaterally.      Results:  The test results were reviewed with [facility]. Today's test results are consistent in the left ear with normal hearing sensitivity ([phone] Hz) sloping to a mild sensorineural hearing loss at 8000 Hz and in the right ear  with normal hearing sensitivity ([id number]) Hz sloping to a mild sensorineural hearing loss 3000-8000 Hz . [name] may have communication difficulty in adverse listening environments. He will benefit from the use of good communication strategies. [name] is not a hearing aid candidate at this time.   Recommendations: 1.   Monitor hearing sensitivity. Due to [name]'s history of mild cognitive impairment, more frequent audiological monitoring is recommended.  2.   Return in 1-2 years for an Audiological evaluation.    30 minutes spent testing and counseling on results.   If you have any questions please feel free to contact me at [phone].  Bari Mantis Audiologist, Au.D., CCC-A 06/29/2022  12:41 PM  Cc: Pablo Lawrence, NP

## 2022-11-19 ENCOUNTER — Ambulatory Visit (INDEPENDENT_AMBULATORY_CARE_PROVIDER_SITE_OTHER): Payer: PPO

## 2022-11-19 ENCOUNTER — Ambulatory Visit: Payer: PPO | Admitting: Podiatry

## 2022-11-19 ENCOUNTER — Encounter: Payer: Self-pay | Admitting: Podiatry

## 2022-11-19 DIAGNOSIS — M19072 Primary osteoarthritis, left ankle and foot: Secondary | ICD-10-CM | POA: Diagnosis not present

## 2022-11-19 DIAGNOSIS — M2032 Hallux varus (acquired), left foot: Secondary | ICD-10-CM

## 2022-11-19 DIAGNOSIS — M19079 Primary osteoarthritis, unspecified ankle and foot: Secondary | ICD-10-CM

## 2022-11-19 DIAGNOSIS — M24575 Contracture, left foot: Secondary | ICD-10-CM

## 2022-11-19 NOTE — Progress Notes (Unsigned)
Subjective: Chief Complaint  Patient presents with   Toe Pain    Hallux left - follow up on injury from January 2024, wound has healed fine, but it seems the toe is getting shorter, neuropathy at night, sitting more forward   72 year old male presents the office with above concerns.  He states that the wounds that he had a left big toe healed well but since this is healed he is notices toe contracted and somewhat shorter.  Is not causing pain or rubbing or any irritation.  No recent treatment since I saw him last.  Objective: AAO x3, NAD DP/PT pulses palpable bilaterally, CRT less than 3 seconds The toe does sit contracted dorsiflex at the level of the MPJ.  There is no pain to the toe or with MPJ range of motion.  Hallux malleus is noted.  No other areas of discomfort. No pain with calf compression, swelling, warmth, erythema  Assessment: 72 year old male with contracture first MPJ, hallux malleus  Plan: -All treatment options discussed with the patient including all alternatives, risks, complications.  -X-rays obtained reviewed.  3 views of the foot were obtained.  Healing fracture along the hallux.  No other evidence of acute fracture.  Hardware intact from prior surgery.  Arthritic changes noted. -I reviewed the x-ray findings with the patient.  I do think that given the old fracture of the still shortened toe but this should not account for the contracture.  Discussed possible tendon or ligamentous injury.  Discussed MRI but he went to problems.  Dispensed Darco splint. -Patient encouraged to call the office with any questions, concerns, change in symptoms.    Vivi Barrack DPM

## 2022-12-24 ENCOUNTER — Other Ambulatory Visit (HOSPITAL_COMMUNITY): Payer: Self-pay | Admitting: Family Medicine

## 2022-12-24 ENCOUNTER — Ambulatory Visit (HOSPITAL_COMMUNITY)
Admission: RE | Admit: 2022-12-24 | Discharge: 2022-12-24 | Disposition: A | Discharge status: Home / Self Care | Payer: PPO | Source: Ambulatory Visit | Attending: Family Medicine | Admitting: Family Medicine

## 2022-12-24 DIAGNOSIS — R059 Cough, unspecified: Secondary | ICD-10-CM | POA: Diagnosis present

## 2023-01-05 DIAGNOSIS — K219 Gastro-esophageal reflux disease without esophagitis: Secondary | ICD-10-CM | POA: Insufficient documentation

## 2023-01-11 ENCOUNTER — Other Ambulatory Visit (HOSPITAL_COMMUNITY): Payer: Self-pay | Admitting: Adult Health Nurse Practitioner

## 2023-01-11 ENCOUNTER — Ambulatory Visit (HOSPITAL_COMMUNITY)
Admission: RE | Admit: 2023-01-11 | Discharge: 2023-01-11 | Disposition: A | Discharge status: Home / Self Care | Payer: PPO | Source: Ambulatory Visit | Attending: Adult Health Nurse Practitioner | Admitting: Adult Health Nurse Practitioner

## 2023-01-11 DIAGNOSIS — R109 Unspecified abdominal pain: Secondary | ICD-10-CM | POA: Diagnosis present

## 2023-01-12 ENCOUNTER — Other Ambulatory Visit (HOSPITAL_COMMUNITY): Payer: Self-pay | Admitting: Adult Health Nurse Practitioner

## 2023-01-12 DIAGNOSIS — R748 Abnormal levels of other serum enzymes: Secondary | ICD-10-CM

## 2023-01-13 ENCOUNTER — Ambulatory Visit (HOSPITAL_COMMUNITY)
Admission: RE | Admit: 2023-01-13 | Discharge: 2023-01-13 | Disposition: A | Discharge status: Home / Self Care | Payer: PPO | Source: Ambulatory Visit | Attending: Adult Health Nurse Practitioner | Admitting: Adult Health Nurse Practitioner

## 2023-01-13 DIAGNOSIS — R748 Abnormal levels of other serum enzymes: Secondary | ICD-10-CM | POA: Insufficient documentation

## 2023-01-26 DIAGNOSIS — Z8679 Personal history of other diseases of the circulatory system: Secondary | ICD-10-CM | POA: Insufficient documentation

## 2023-01-26 HISTORY — PX: OTHER SURGICAL HISTORY: SHX169

## 2023-02-11 ENCOUNTER — Telehealth: Payer: Self-pay

## 2023-02-11 ENCOUNTER — Emergency Department (HOSPITAL_COMMUNITY)
Admission: EM | Admit: 2023-02-11 | Discharge: 2023-02-11 | Disposition: A | Discharge status: Home / Self Care | Payer: PPO

## 2023-02-11 DIAGNOSIS — E876 Hypokalemia: Secondary | ICD-10-CM | POA: Insufficient documentation

## 2023-02-11 DIAGNOSIS — R188 Other ascites: Secondary | ICD-10-CM | POA: Insufficient documentation

## 2023-02-11 DIAGNOSIS — R918 Other nonspecific abnormal finding of lung field: Secondary | ICD-10-CM | POA: Insufficient documentation

## 2023-02-11 DIAGNOSIS — R600 Localized edema: Secondary | ICD-10-CM | POA: Insufficient documentation

## 2023-02-11 DIAGNOSIS — Z7901 Long term (current) use of anticoagulants: Secondary | ICD-10-CM | POA: Insufficient documentation

## 2023-02-11 DIAGNOSIS — R7401 Elevation of levels of liver transaminase levels: Secondary | ICD-10-CM | POA: Diagnosis not present

## 2023-02-11 DIAGNOSIS — R109 Unspecified abdominal pain: Secondary | ICD-10-CM | POA: Diagnosis present

## 2023-02-11 HISTORY — DX: Alcoholic cirrhosis of liver without ascites: K70.30

## 2023-02-11 HISTORY — DX: Unspecified atrial fibrillation: I48.91

## 2023-02-11 HISTORY — DX: Other nonspecific abnormal finding of lung field: R91.8

## 2023-02-11 LAB — CBC WITH DIFFERENTIAL/PLATELET
Abs Immature Granulocytes: 0.01 10*3/uL (ref 0.00–0.07)
Basophils Absolute: 0.1 10*3/uL (ref 0.0–0.1)
Basophils Relative: 1 %
Eosinophils Absolute: 0.2 10*3/uL (ref 0.0–0.5)
Eosinophils Relative: 3 %
HCT: 42.1 % (ref 39.0–52.0)
Hemoglobin: 14.3 g/dL (ref 13.0–17.0)
Immature Granulocytes: 0 %
Lymphocytes Relative: 32 %
Lymphs Abs: 2.2 10*3/uL (ref 0.7–4.0)
MCH: 34 pg (ref 26.0–34.0)
MCHC: 34 g/dL (ref 30.0–36.0)
MCV: 100 fL (ref 80.0–100.0)
Monocytes Absolute: 0.8 10*3/uL (ref 0.1–1.0)
Monocytes Relative: 12 %
Neutro Abs: 3.5 10*3/uL (ref 1.7–7.7)
Neutrophils Relative %: 52 %
Platelets: 226 10*3/uL (ref 150–400)
RBC: 4.21 MIL/uL — ABNORMAL LOW (ref 4.22–5.81)
RDW: 13.2 % (ref 11.5–15.5)
WBC: 6.7 10*3/uL (ref 4.0–10.5)
nRBC: 0 % (ref 0.0–0.2)

## 2023-02-11 LAB — COMPREHENSIVE METABOLIC PANEL
ALT: 43 U/L (ref 0–44)
AST: 110 U/L — ABNORMAL HIGH (ref 15–41)
Albumin: 3.2 g/dL — ABNORMAL LOW (ref 3.5–5.0)
Alkaline Phosphatase: 185 U/L — ABNORMAL HIGH (ref 38–126)
Anion gap: 7 (ref 5–15)
BUN: 10 mg/dL (ref 8–23)
CO2: 29 mmol/L (ref 22–32)
Calcium: 8.5 mg/dL — ABNORMAL LOW (ref 8.9–10.3)
Chloride: 101 mmol/L (ref 98–111)
Creatinine, Ser: 0.73 mg/dL (ref 0.61–1.24)
GFR, Estimated: >60 mL/min (ref >60)
Glucose, Bld: 145 mg/dL — ABNORMAL HIGH (ref 70–99)
Potassium: 3.3 mmol/L — ABNORMAL LOW (ref 3.5–5.1)
Sodium: 137 mmol/L (ref 135–145)
Total Bilirubin: 0.7 mg/dL (ref 0.3–1.2)
Total Protein: 6.3 g/dL — ABNORMAL LOW (ref 6.5–8.1)

## 2023-02-11 LAB — BRAIN NATRIURETIC PEPTIDE: B Natriuretic Peptide: 225 pg/mL — ABNORMAL HIGH (ref 0.0–100.0)

## 2023-02-11 NOTE — ED Provider Triage Note (Signed)
Emergency Medicine Provider Triage Evaluation Note  Darren Gay , a 72 y.o. male  was evaluated in triage.  Pt reports he was told by GI provider to come here for ascites for paracentesis. Review of Systems  Positive: Distended abdomen, bilateral pedal edema Negative: Fever, Sob, CP  Physical Exam  BP 101/70 (BP Location: Right Arm)   Pulse 64   Temp (!) 97.5 F (36.4 C)   Resp 18   SpO2 98%  Gen:   Awake, no distress   Resp:  Normal effort  MSK:   Moves extremities without difficulty  Other:  Bilateral pedal edema and distended abd  Medical Decision Making  Medically screening exam initiated at 4:34 PM.  Appropriate orders placed.  Verdell Face was informed that the remainder of the evaluation will be completed by another provider, this initial triage assessment does not replace that evaluation, and the importance of remaining in the ED until their evaluation is complete.  Labs ordered   Judithann Sheen, [place] 02/11/23 [phone]

## 2023-02-11 NOTE — Telephone Encounter (Signed)
Scheduled OV with patient's wife for 11/5 at 11:30 am with Dr. Leone Payor. Spoke with PCP & advised her of MD recommendations. She wanted me to let him know that she's ordered everything stat (labs & CT) and will be forwarding him the results. Pt's BP is good, weight stable. EKG normal.

## 2023-02-11 NOTE — Telephone Encounter (Signed)
Received a call from patient's PCP, [name] requesting that patient be seen ASAP for OV with Dr. Leone Payor. He is currently scheduled for January. Last seen here in 2019 with Dr. Leone Payor. Patient has alcoholic cirrhosis with severe swelling in his abdomen. Elevated alk phos & LFT's. She ordered a stat CT for today. Recently had an [place] that showed severe gallbladder sludge as well & she referred him to [facility]. She offered to refer patient to another office to be seen sooner, but patient & wife requesting our office. She is going to send copy of notes & results to Dr. Leone Payor. It is also available in CareEverywhere.

## 2023-02-11 NOTE — Discharge Instructions (Addendum)
Please follow-up with your primary doctor and gastroenterologist.  You may call interventional radiology to schedule paracentesis outpatient.  Please return immediately for fevers, chills, worsening abdominal pain, chest pain, shortness of breath or any new or worsening symptoms that are concerning to you.

## 2023-02-11 NOTE — Telephone Encounter (Signed)
I can see him at 1130 11/5 but it sounds like he may need ED before then - tell the PCP that

## 2023-02-11 NOTE — ED Triage Notes (Signed)
Pt states that he has had a CT scan and it was recommend that he have fluid removed froom his abdoman. Pt has an appointment to be seen in the office but was recommended he come to the ED to have a paracentesis.

## 2023-02-11 NOTE — ED Provider Notes (Signed)
[place] EMERGENCY DEPARTMENT AT [facility] Provider Note   CSN: [id number] Arrival date & time: 02/11/23  1511     History {Add pertinent medical, surgical, social history, OB history to HPI:1} Chief Complaint  Patient presents with   Abdominal Pain    Darren Gay is a 72 y.o. male.   Abdominal Pain      Home Medications Prior to Admission medications   Medication Sig Start Date End Date Taking? Authorizing Provider  allopurinol (ZYLOPRIM) 100 MG tablet Take 200 mg by mouth daily.     [provider]  amiodarone (PACERONE) 200 MG tablet Take by mouth. 11/08/22   [provider]  amLODipine (NORVASC) 10 MG tablet Take 10 mg by mouth daily. 03/30/22   [provider]  amphetamine-dextroamphetamine (ADDERALL XR) 20 MG 24 hr capsule Take 1 capsule (20 mg dose) by mouth every morning. 02/11/22     Ascorbic Acid (VITAMIN C) 1000 MG tablet Take 1,000 mg by mouth daily.    [provider]  carvedilol (COREG) 6.25 MG tablet Take 6.25 mg by mouth 2 (two) times daily with a meal. 03/30/22   [provider]  doxycycline (MONODOX) 100 MG capsule Take 100 mg by mouth 2 (two) times daily. 04/26/22   [provider]  ELIQUIS 5 MG TABS tablet Take 5 mg by mouth 2 (two) times daily. 04/19/22   [provider]  fexofenadine (ALLEGRA) 180 MG tablet Take 180 mg by mouth as needed for allergies or rhinitis.    [provider]  FLUoxetine (PROZAC) 20 MG capsule Take 1 capsule by mouth daily 11/24/21     fluticasone (FLONASE) 50 MCG/ACT nasal spray Place 1 spray into both nostrils daily as needed for allergies.     [provider]  gabapentin (NEURONTIN) 300 MG capsule Take 300 mg by mouth as needed.    [provider]  methocarbamol (ROBAXIN) 500 MG tablet Take 500 mg by mouth 3 (three) times daily.    [provider]  omeprazole (PRILOSEC) 40 MG capsule Take 40 mg by mouth daily. 03/26/22    [provider]  Sodium Fluoride 1.1 % PSTE Apply to toothbrush at bedtime as directed 09/15/21     tadalafil (CIALIS) 20 MG tablet Take 1 tablet by mouth daily as needed for Erectile Dysfunction. 11/24/21         Allergies    Ciprofloxacin and Colchicine    Review of Systems   Review of Systems  Gastrointestinal:  Positive for abdominal pain.    Physical Exam Updated Vital Signs BP 115/79   Pulse 60   Temp (!) 97.5 F (36.4 C)   Resp 16   SpO2 96%  Physical Exam  ED Results / Procedures / Treatments   Labs (all labs ordered are listed, but only abnormal results are displayed) Labs Reviewed  CBC WITH DIFFERENTIAL/PLATELET - Abnormal; Notable for the following components:      Result Value   RBC 4.21 (*)    All other components within normal limits  COMPREHENSIVE METABOLIC PANEL - Abnormal; Notable for the following components:   Potassium 3.3 (*)    Glucose, Bld 145 (*)    Calcium 8.5 (*)    Total Protein 6.3 (*)    Albumin 3.2 (*)    AST 110 (*)    Alkaline Phosphatase 185 (*)    All other components within normal limits  BRAIN NATRIURETIC PEPTIDE - Abnormal; Notable for the following components:  B Natriuretic Peptide 225.0 (*)    All other components within normal limits    EKG None  Radiology No results found.  Procedures Procedures  {Document cardiac monitor, telemetry assessment procedure when appropriate:1}  Medications Ordered in ED Medications - No data to display  ED Course/ Medical Decision Making/ A&P   {   Click here for ABCD2, HEART and other calculatorsREFRESH Note before signing :1}                              Medical Decision Making Amount and/or Complexity of Data Reviewed Labs: ordered.   ***  {Document critical care time when appropriate:1} {Document review of labs and clinical decision tools ie heart score, Chads2Vasc2 etc:1}  {Document your independent review of radiology images, and any outside  records:1} {Document your discussion with family members, caretakers, and with consultants:1} {Document social determinants of health affecting pt's care:1} {Document your decision making why or why not admission, treatments were needed:1} Final Clinical Impression(s) / ED Diagnoses Final diagnoses:  Other ascites    Rx / DC Orders ED Discharge Orders     None

## 2023-02-15 ENCOUNTER — Telehealth: Payer: Self-pay

## 2023-02-15 ENCOUNTER — Encounter: Payer: Self-pay | Admitting: Internal Medicine

## 2023-02-15 ENCOUNTER — Other Ambulatory Visit (INDEPENDENT_AMBULATORY_CARE_PROVIDER_SITE_OTHER): Payer: PPO

## 2023-02-15 ENCOUNTER — Ambulatory Visit: Payer: PPO | Admitting: Internal Medicine

## 2023-02-15 VITALS — BP 110/70 | HR 74 | Ht 72.0 in | Wt 220.0 lb

## 2023-02-15 DIAGNOSIS — R601 Generalized edema: Secondary | ICD-10-CM

## 2023-02-15 DIAGNOSIS — K7031 Alcoholic cirrhosis of liver with ascites: Secondary | ICD-10-CM | POA: Diagnosis not present

## 2023-02-15 DIAGNOSIS — R296 Repeated falls: Secondary | ICD-10-CM | POA: Diagnosis not present

## 2023-02-15 DIAGNOSIS — Z7901 Long term (current) use of anticoagulants: Secondary | ICD-10-CM | POA: Insufficient documentation

## 2023-02-15 DIAGNOSIS — E7220 Disorder of urea cycle metabolism, unspecified: Secondary | ICD-10-CM

## 2023-02-15 DIAGNOSIS — Z860101 Personal history of adenomatous and serrated colon polyps: Secondary | ICD-10-CM | POA: Diagnosis not present

## 2023-02-15 DIAGNOSIS — I4891 Unspecified atrial fibrillation: Secondary | ICD-10-CM

## 2023-02-15 DIAGNOSIS — R188 Other ascites: Secondary | ICD-10-CM

## 2023-02-15 LAB — IBC + FERRITIN
Ferritin: 339.6 ng/mL — ABNORMAL HIGH (ref 22.0–322.0)
Iron: 65 ug/dL (ref 42–165)
Saturation Ratios: 29.2 % (ref 20.0–50.0)
TIBC: 222.6 ug/dL — ABNORMAL LOW (ref 250.0–450.0)
Transferrin: 159 mg/dL — ABNORMAL LOW (ref 212.0–360.0)

## 2023-02-15 LAB — PROTIME-INR
INR: 1.6 {ratio} — ABNORMAL HIGH (ref 0.8–1.0)
Prothrombin Time: 16.3 s — ABNORMAL HIGH (ref 9.6–13.1)

## 2023-02-15 LAB — MAGNESIUM: Magnesium: 1.6 mg/dL (ref 1.5–2.5)

## 2023-02-15 LAB — BUN: BUN: 6 mg/dL (ref 6–23)

## 2023-02-15 NOTE — Patient Instructions (Addendum)
VISIT SUMMARY:  During today's visit, we discussed your chronic liver disease, chronic edema, atrial fibrillation, and fall risk. We reviewed your symptoms, including lower extremity swelling, abdominal bloating, weight gain, and fatigue. We also discussed your alcohol use and its potential impact on your liver health. A plan was made to address each of these issues and provide you with the necessary support and treatment options.  YOUR PLAN:  -CHRONIC LIVER DISEASE: Chronic liver disease, likely due to long-term alcohol use, can lead to liver damage and cirrhosis. We will order a paracentesis to drain the fluid in your abdomen and additional labs to rule out other causes of liver disease. It is crucial that you stop drinking alcohol entirely, and we can provide resources to help you with this. We may also add a medication called spironolactone to help manage the fluid buildup after reviewing your lab results.  -CHRONIC EDEMA: Chronic edema is swelling caused by fluid accumulation, which has worsened recently. You should continue taking furosemide 40mg  daily, and we may add spironolactone to your treatment plan after reviewing your lab results.  -ATRIAL FIBRILLATION: Atrial fibrillation is an irregular heart rhythm that you have been managing with amiodarone. Since you are experiencing fatigue, we will review your medication regimen and consider making adjustments if necessary.  -FALL RISK: Due to the swelling in your legs and difficulty walking, you are at an increased risk of falling. We recommend limiting your activity and using a cane or other assistive device for stability. Please follow up with your primary care provider to discuss further interventions to reduce your fall risk.  -GENERAL HEALTH MAINTENANCE: We will defer your colonoscopy until your condition is more stable. Additionally, we will provide you with resources to support you in stopping alcohol consumption.  INSTRUCTIONS:  Please  follow up with your primary care provider regarding your fall risk and potential interventions. We will also need to review your lab results before making any changes to your medication regimen. If you have any questions or concerns, do not hesitate to contact our office.   You need to stop drinking to improve your health. Here are a few treatment centers you can try. I recommend you call and ask for help with alcohol addiction.   [facility] [phone]   [facility] [phone]   [facility] [phone]  We will see if you can hold Eliquis and if so will schedule paracentesis  Will contact with results and plans otherwise.   I appreciate the opportunity to care for you. Stan Head, MD, [facility]

## 2023-02-15 NOTE — Progress Notes (Signed)
Darren Gay 72 y.o. 1951/01/26 [id number]  Assessment & Plan:   Encounter Diagnoses  Name Primary?   Alcoholic cirrhosis of liver with ascites (HCC) Yes   Anasarca    Falls    Hx of adenomatous colonic polyps    Hyperammonemia (HCC)    Long term current use of anticoagulant - eliquis    Atrial fibrillation, unspecified type [facility]) s/p ablation 01/2023        Chronic Liver Disease Likely secondary to alcohol use, with evidence of cirrhosis on CT scan.  Possible metabolic syndrome contribution also.  Patient reports regular alcohol use of approximately 4 shots of whiskey daily. Physical exam consistent with moderate ascites and liver palpable three fingerbreadths below the costal margin. -Order paracentesis to drain ascites if he can hold Eliquis - ask cardiology prior to scheduling -Order additional labs to rule out other causes of liver disease. -Strongly advise patient to cease alcohol consumption entirely.  He was given phone numbers of treatment clinics. -Consider adding spironolactone to current furosemide regimen for fluid management after reviewing labs. -2 g sodium diet -Continue carvedilol, since he is on this he does not need screening EGD (varices).  Normal platelets reduce the likelihood of needing that as well. - I did order an INR as you will see below, I know this will be inaccurate in the setting of Eliquis but there could be some information obtained from the test today.  Cannot calculate MELD or Child-Pugh score accurately in the setting of the Eliquis given its potential effect on INR. -Keep in mind that amiodarone is a hepatotoxin -I do not think the elevated ammonia level warrants treatment with lactulose, he is alert and oriented x 3 and does not have asterixis.  There could be slight subclinical encephalopathy, he has had a neurologic evaluation for some chronic issues and they did not find anything significant per the patient and his wife.  Will monitor.  He  may need treatment Xifaxan plus or minus lactulose but it did not seem imperative to do that now.  Follow clinical exam.   Chronic Edema/anasarca  Longstanding issue, worse recently. Patient reports using support hose with some success in the past, but less effective recently. -Continue furosemide 40mg  daily. -Consider adding spironolactone after reviewing labs.  Fall Risk Patient reports recent falls due to leg swelling and difficulty walking. -Advise patient to limit activity and consider use of a cane or other assistive device for stability. -Recommend follow-up with primary care provider regarding fall risk and potential interventions.      Was due for a colonoscopy due to a history of polyps this year but hold on that given these other problems.  Follow-up not scheduled yet today it will depend upon results and clinical course.  We will get that arranged.  Orders Placed This Encounter  Procedures   ANA   Anti-smooth muscle antibody, IgG   BUN   Magnesium   Mitochondrial antibodies   Alpha-1-antitrypsin   AFP tumor marker   Hepatitis C antibody   Hepatitis B surface antibody,qualitative   Hepatitis B surface antigen   Hepatitis B core antibody, total   Hepatitis A antibody, total   Protime-INR   IBC + Ferritin   BUN should be be met we will try to add CC: Darren Rutherford, NP      Subjective:   Chief Complaint: Abdominal pain bloating altered taste.  New diagnosis of liver disease.  HPI 72 year old white man presents with his wife today, seen recently  by primary care with abdominal distention and a workup that revealed changes of alcoholic liver disease/cirrhosis and ascites.   Discussed the use of AI scribe software for clinical note transcription with the patient, who gave verbal consent to proceed.   The patient, with a history of atrial fibrillation and recent ablation, presents with chronic and worsening lower extremity edema, abdominal bloating, and  weight gain despite loss of appetite. He reports a sensation of early satiety and altered taste. The patient also notes chronic cough and persistent nausea. The lower extremity edema has been a long-standing issue, previously managed with support hose, but has recently worsened and now involves both feet.  The patient has a history of regular alcohol use, consuming approximately four shots of whiskey daily for several years. He also reports a history of falls, attributing this to difficulty bending his knees due to fluid accumulation.  The patient has been on amiodarone since July, which he believes has contributed to his symptoms of fatigue and confusion. He also reports changes in his scrotum, suggesting possible fluid accumulation.      Data/chart review.  Last seen by [name] NP at primary care on 11 1.  She was seen in the ED and any pending given the ascites but they did not have anybody available to perform paracentesis so it was not done.  TSH was normal at 2.56 Free T41.5 On that date, but ammonia level was 184.  There has been some chronic mild confusion and he has seen neurology but they did not find any significant abnormalities.  Albumin 3.7 alk phos 243 AST 108 with ALT 38 on 11 1.  On October 1 alk phos was 292 AST 152 ALT 56.  Bilirubin has been normal.  Renal function has been normal.  He had a consultation with Dr. Luisa Hart of [facility] surgery due to gallbladder sludge on ultrasound.  No intervention recommended.  CT abdomen pelvis with IV contrast at [facility] 02/11/2023 with minimal atelectasis at the lung bases, cirrhosis with ascites, pancreatic atrophy versus fat deposition, and some degenerative disc disease lumbar as well as foraminal stenosis pars defects and anterolisthesis.  I reviewed the ED visit from 02/11/2023 as well.  Repeat labs there were similar to the office labs on the same date.  Hemoglobin 14.3 platelets 226.  He had a good ejection fraction on  echocardiogram in late 2023.  Cardiology care is through [facility].  Dr. Dyane Dustman.  Neurology evaluation 06/24/2022 1. Mild cognitive impairment       PLAN:   BRAIN FOG, dizziness, decreased attention (MMSE 30/30; no changes in ADLs; could be related to underlying stress, low testosterone, OSA on CPAP, insomnia, depression)  Allergies  Allergen Reactions   Ciprofloxacin     Hx tendon rupture   Colchicine Other (See Comments)    makes me fatigue   Current Meds  Medication Sig   allopurinol (ZYLOPRIM) 100 MG tablet Take 200 mg by mouth daily.    amiodarone (PACERONE) 200 MG tablet Take by mouth.   Ascorbic Acid (VITAMIN C) 1000 MG tablet Take 1,000 mg by mouth daily.   carvedilol (COREG) 6.25 MG tablet Take 6.25 mg by mouth 2 (two) times daily with a meal.   ELIQUIS 5 MG TABS tablet Take 5 mg by mouth 2 (two) times daily.   fexofenadine (ALLEGRA) 180 MG tablet Take 180 mg by mouth as needed for allergies or rhinitis.   FLUoxetine (PROZAC) 20 MG capsule Take 1 capsule by mouth daily  fluticasone (FLONASE) 50 MCG/ACT nasal spray Place 1 spray into both nostrils daily as needed for allergies.    gabapentin (NEURONTIN) 300 MG capsule Take 300 mg by mouth as needed.   methocarbamol (ROBAXIN) 500 MG tablet Take 500 mg by mouth 3 (three) times daily.   omeprazole (PRILOSEC) 40 MG capsule Take 40 mg by mouth daily.   Sodium Fluoride 1.1 % PSTE Apply to toothbrush at bedtime as directed   tadalafil (CIALIS) 20 MG tablet Take 1 tablet by mouth daily as needed for Erectile Dysfunction.   Past Medical History:  Diagnosis Date   Alcoholic cirrhosis of liver (HCC)    Allergy    Arthritis    Atrial fibrillation (HCC)    Depression    Gout    High cholesterol    borderline   Hx of adenomatous colonic polyps 03/29/2014   Hypertension    Pulmonary nodules    Sleep apnea    wears cpap   Substance abuse [facility])    Past Surgical History:  Procedure Laterality Date   ANKLE SURGERY      tendon repair   BUNIONECTOMY     CARPAL TUNNEL RELEASE Bilateral    COLONOSCOPY     heart ablation  01/26/2023   KNEE ARTHROSCOPY  2010   LYMPHADENECTOMY Bilateral 06/21/2019   Procedure: LYMPHADENECTOMY, PELVIC;  Surgeon: Heloise Purpura, MD;  Location: [facility];  Service: Urology;  Laterality: Bilateral;   ROBOT ASSISTED LAPAROSCOPIC RADICAL PROSTATECTOMY N/A 06/21/2019   Procedure: XI ROBOTIC ASSISTED LAPAROSCOPIC RADICAL PROSTATECTOMY LEVEL 2;  Surgeon: Heloise Purpura, MD;  Location: [facility];  Service: Urology;  Laterality: N/A;   TONSILLECTOMY  1960   VASECTOMY  1991   Social History   Social History Narrative   Pt lives at home with his family.   Caffeine Use: 2 cups daily   family history includes Breast cancer in his mother; Hypertension in his father; Pancreatic cancer in his brother; Skin cancer in his father; Stomach cancer in his mother.   Review of Systems As per HPI.  He has dyspnea and early satiety, insomnia, headaches, depressed mood at times allergy and sinus problems and joint complaints consistent with arthritis.  Otherwise negative.  Objective:   Physical Exam BP 110/70 (BP Location: Left Arm, Patient Position: Sitting, Cuff Size: Normal)   Pulse 74   Ht 6' (1.829 m)   Wt 220 lb (99.8 kg)   SpO2 97%   BMI 29.84 kg/m  Physical Exam   HEENT: Eyes without jaundice. CHEST: Slight crackles at the right base, otherwise clear. Good air movement. CARDIOVASCULAR: Heart sounds with a normal rhythm and rate, no rubs, murmurs, or gallops. ABDOMEN: Protuberant, presence of a supraumbilical scar, small umbilical hernia soft but above the umbilicus, induration, nontender. Liver palpable three fingerbreadths below the costal margin, firm. Spleen not palpable.  EXTREMITIES: Forearms with multiple ecchymoses, some with eschar. Lower extremities show three plus pitting edema bilaterally, left slightly greater than right up to below the knee. SKIN: No spider angiomas observed.      Neuro he is alert noted x 3 there is no asterixis

## 2023-02-15 NOTE — Telephone Encounter (Signed)
Anti-Coag letter faxed to Dr Gabriela Eves office in [facility]. Fax # [phone]. Will await response.

## 2023-02-16 ENCOUNTER — Other Ambulatory Visit: Payer: Self-pay | Admitting: Internal Medicine

## 2023-02-16 ENCOUNTER — Other Ambulatory Visit (INDEPENDENT_AMBULATORY_CARE_PROVIDER_SITE_OTHER): Payer: PPO

## 2023-02-16 DIAGNOSIS — K7031 Alcoholic cirrhosis of liver with ascites: Secondary | ICD-10-CM

## 2023-02-16 LAB — BASIC METABOLIC PANEL
BUN: 7 mg/dL (ref 6–23)
CO2: 28 meq/L (ref 19–32)
Calcium: 8.9 mg/dL (ref 8.4–10.5)
Chloride: 98 meq/L (ref 96–112)
Creatinine, Ser: 0.77 mg/dL (ref 0.40–1.50)
GFR: 89.33 mL/min (ref >60.00)
Glucose, Bld: 89 mg/dL (ref 70–99)
Potassium: 3.9 meq/L (ref 3.5–5.1)
Sodium: 140 meq/L (ref 135–145)

## 2023-02-17 ENCOUNTER — Other Ambulatory Visit: Payer: Self-pay | Admitting: Internal Medicine

## 2023-02-17 ENCOUNTER — Other Ambulatory Visit: Payer: Self-pay

## 2023-02-17 DIAGNOSIS — K7031 Alcoholic cirrhosis of liver with ascites: Secondary | ICD-10-CM

## 2023-02-17 MED ORDER — SPIRONOLACTONE 50 MG PO TABS: 100.0000 mg | ORAL_TABLET | Freq: Every day | ORAL | 3 refills | Status: DC

## 2023-02-18 NOTE — Telephone Encounter (Signed)
We received a fax from Dr Archie's office that the "Risk is Low". I have faxed it back to them to get clarification on him holding the Eliquis 2 days prior to a paracentesis.

## 2023-02-20 LAB — HEPATITIS B SURFACE ANTIGEN: Hepatitis B Surface Ag: NONREACTIVE

## 2023-02-20 LAB — AFP TUMOR MARKER: AFP-Tumor Marker: 4.3 ng/mL (ref <6.1)

## 2023-02-20 LAB — HEPATITIS B SURFACE ANTIBODY,QUALITATIVE: Hep B S Ab: NONREACTIVE

## 2023-02-20 LAB — ANTI-NUCLEAR AB-TITER (ANA TITER): ANA Titer 1: 1:40 {titer} — ABNORMAL HIGH

## 2023-02-20 LAB — ALPHA-1-ANTITRYPSIN: A-1 Antitrypsin, Ser: 229 mg/dL — ABNORMAL HIGH (ref 83–199)

## 2023-02-20 LAB — HEPATITIS A ANTIBODY, TOTAL: Hepatitis A AB,Total: REACTIVE — AB

## 2023-02-20 LAB — ANTI-SMOOTH MUSCLE ANTIBODY, IGG: Actin (Smooth Muscle) Antibody (IGG): <20 U (ref <20)

## 2023-02-20 LAB — HEPATITIS C ANTIBODY: Hepatitis C Ab: NONREACTIVE

## 2023-02-20 LAB — ANA: Anti Nuclear Antibody (ANA): POSITIVE — AB

## 2023-02-20 LAB — MITOCHONDRIAL ANTIBODIES: Mitochondrial M2 Ab, IgG: <=20 U (ref <=20.0)

## 2023-02-20 LAB — HEPATITIS B CORE ANTIBODY, TOTAL: Hep B Core Total Ab: NONREACTIVE

## 2023-02-20 NOTE — Telephone Encounter (Signed)
Please order paracentesis up to 5 L.  Let IR know we have clearance to hold the Eliquis 2 days and they may act accordingly.  Studies that need to be done:  Cell count plus differential Albumin Culture

## 2023-02-21 ENCOUNTER — Other Ambulatory Visit: Payer: Self-pay | Admitting: *Deleted

## 2023-02-21 DIAGNOSIS — E559 Vitamin D deficiency, unspecified: Secondary | ICD-10-CM | POA: Insufficient documentation

## 2023-02-21 DIAGNOSIS — K7031 Alcoholic cirrhosis of liver with ascites: Secondary | ICD-10-CM

## 2023-02-21 DIAGNOSIS — R188 Other ascites: Secondary | ICD-10-CM

## 2023-02-21 NOTE — Telephone Encounter (Signed)
Called patient and explained to him that Dr Leone Payor agreed if IR doesn't require that he hold the Eliquis then he doesn't have to.

## 2023-02-21 NOTE — Telephone Encounter (Signed)
If IR does not hold then he does not need to

## 2023-02-21 NOTE — Telephone Encounter (Signed)
[phone]   Per IR Patients do not have to hold Blood thinners any more.His Paracentesis is 11/14 at 1 pm.   Dr Leone Payor Do you still want patient to hold Eliquis 2 days since IR does not require for paracentesis anymore I told the patient I would let him know by the end of the day   Thanks!

## 2023-02-24 ENCOUNTER — Ambulatory Visit (HOSPITAL_COMMUNITY)
Admission: EM | Admit: 2023-02-24 | Discharge: 2023-02-24 | Disposition: A | Discharge status: Home / Self Care | Payer: PPO | Attending: Family Medicine | Admitting: Family Medicine

## 2023-02-24 ENCOUNTER — Ambulatory Visit (HOSPITAL_COMMUNITY)
Admission: RE | Admit: 2023-02-24 | Discharge: 2023-02-24 | Disposition: A | Discharge status: Home / Self Care | Payer: PPO | Source: Ambulatory Visit | Attending: Internal Medicine | Admitting: Internal Medicine

## 2023-02-24 DIAGNOSIS — K7031 Alcoholic cirrhosis of liver with ascites: Secondary | ICD-10-CM | POA: Diagnosis present

## 2023-02-24 DIAGNOSIS — R233 Spontaneous ecchymoses: Secondary | ICD-10-CM | POA: Diagnosis not present

## 2023-02-24 DIAGNOSIS — R600 Localized edema: Secondary | ICD-10-CM

## 2023-02-24 DIAGNOSIS — R188 Other ascites: Secondary | ICD-10-CM

## 2023-02-24 HISTORY — PX: IR PARACENTESIS: IMG2679

## 2023-02-24 LAB — BODY FLUID CELL COUNT WITH DIFFERENTIAL
Eos, Fluid: 0 %
Lymphs, Fluid: 79 %
Monocyte-Macrophage-Serous Fluid: 21 % — ABNORMAL LOW (ref 50–90)
Neutrophil Count, Fluid: 0 % (ref 0–25)
Total Nucleated Cell Count, Fluid: 443 uL (ref 0–1000)

## 2023-02-24 LAB — GRAM STAIN

## 2023-02-24 LAB — ALBUMIN, PLEURAL OR PERITONEAL FLUID: Albumin, Fluid: 1.6 g/dL

## 2023-02-24 MED ORDER — LIDOCAINE HCL 1 % IJ SOLN
INTRAMUSCULAR | Status: AC
  Filled 2023-02-24: qty 20

## 2023-02-24 NOTE — ED Provider Notes (Signed)
[facility]    CSN: [id number] Arrival date & time: 02/24/23  1332      History   Chief Complaint Chief Complaint  Patient presents with   Rash    HPI Darren Gay is a 72 y.o. male.    Rash Associated symptoms: no abdominal pain    Patient is here for rash at the bilateral lower legs.  He has swelling and fluid build up in the LE for a while.  He just had several liters of fluid removed from his abdomen at the hospital.  He has cirrhosis of the liver, and seeing GI.  He is on laxix and spironolactone for edema.  He had his legs wrapped by his pcp earlier this month.  Took them off two ago, and things looked okay.  The edema is overall much better, but then noted the rash to the lower extremities today.   This is concerning to them overall, and wanted to be seen today for this.  He is otherwise feeling okay, at his baseline.  No sob at this time.   He just had blood work 3 days ago, normal CBC including platelets.       Past Medical History:  Diagnosis Date   Alcoholic cirrhosis of liver (HCC)    Allergy    Arthritis    Atrial fibrillation (HCC)    Depression    Gout    High cholesterol    borderline   Hx of adenomatous colonic polyps 03/29/2014   Hypertension    Pulmonary nodules    Sleep apnea    wears cpap   Substance abuse [facility])     Patient Active Problem List   Diagnosis Date Noted   Alcoholic cirrhosis of liver with ascites (HCC) 02/15/2023   Long term current use of anticoagulant - eliquis 02/15/2023   Persistent cognitive impairment 04/30/2022   Atrial fibrillation (HCC) 01/26/2022   Attention deficit hyperactivity disorder (ADHD), predominantly inattentive type 01/12/2021   History of prostate cancer 01/12/2021   Major depressive disorder, recurrent episode, moderate degree (HCC) 01/12/2021   Mixed hyperlipidemia 11/11/2020   Prediabetes 11/11/2020   Erectile dysfunction 10/31/2020   Isthmic spondylolisthesis 02/25/2020   Low  back pain 02/25/2020   Prostate cancer (HCC) 06/21/2019   Tendon tear 08/08/2017   Acute pain of right shoulder 07/01/2017   Bunion, left 06/11/2016   Idiopathic chronic gout of left foot without tophus 06/11/2016   Hx of adenomatous colonic polyps 03/29/2014   Chest pain 01/13/2013   Plantar fasciitis of right foot 01/21/2012   TESTICULAR HYPOFUNCTION 06/13/2009   Essential hypertension 06/03/2008   DEPRESSION 12/26/2006   Allergic rhinitis 10/20/2006   Sleep apnea 10/20/2006    Past Surgical History:  Procedure Laterality Date   ANKLE SURGERY     tendon repair   BUNIONECTOMY     CARPAL TUNNEL RELEASE Bilateral    COLONOSCOPY     heart ablation  01/26/2023   IR PARACENTESIS  02/24/2023   KNEE ARTHROSCOPY  2010   LYMPHADENECTOMY Bilateral 06/21/2019   Procedure: LYMPHADENECTOMY, PELVIC;  Surgeon: Heloise Purpura, MD;  Location: [facility];  Service: Urology;  Laterality: Bilateral;   ROBOT ASSISTED LAPAROSCOPIC RADICAL PROSTATECTOMY N/A 06/21/2019   Procedure: XI ROBOTIC ASSISTED LAPAROSCOPIC RADICAL PROSTATECTOMY LEVEL 2;  Surgeon: Heloise Purpura, MD;  Location: [facility];  Service: Urology;  Laterality: N/A;   TONSILLECTOMY  1960   VASECTOMY  1991       Home Medications    Prior to  Admission medications   Medication Sig Start Date End Date Taking? Authorizing Provider  allopurinol (ZYLOPRIM) 100 MG tablet Take 200 mg by mouth daily.     [provider]  amiodarone (PACERONE) 200 MG tablet Take by mouth. 11/08/22   [provider]  Ascorbic Acid (VITAMIN C) 1000 MG tablet Take 1,000 mg by mouth daily.    [provider]  carvedilol (COREG) 6.25 MG tablet Take 6.25 mg by mouth 2 (two) times daily with a meal. 03/30/22   [provider]  ELIQUIS 5 MG TABS tablet Take 5 mg by mouth 2 (two) times daily. 04/19/22   [provider]  fexofenadine (ALLEGRA) 180 MG tablet Take 180 mg by mouth as needed for allergies or rhinitis.    [provider]  FLUoxetine (PROZAC) 20 MG capsule Take 1 capsule by mouth daily 11/24/21     fluticasone (FLONASE) 50 MCG/ACT nasal spray Place 1 spray into both nostrils daily as needed for allergies.     [provider]  furosemide (LASIX) 40 MG tablet Take 40 mg by mouth daily. 02/11/23   [provider]  gabapentin (NEURONTIN) 300 MG capsule Take 300 mg by mouth as needed.    [provider]  methocarbamol (ROBAXIN) 500 MG tablet Take 500 mg by mouth 3 (three) times daily.    [provider]  omeprazole (PRILOSEC) 40 MG capsule Take 40 mg by mouth daily. 03/26/22   [provider]  potassium chloride (KLOR-CON M) 10 MEQ tablet Take 10 mEq by mouth daily. 01/27/23   [provider]  Sodium Fluoride 1.1 % PSTE Apply to toothbrush at bedtime as directed 09/15/21     spironolactone (ALDACTONE) 50 MG tablet Take 2 tablets (100 mg total) by mouth daily. 02/17/23   Iva Boop, MD  tadalafil (CIALIS) 20 MG tablet Take 1 tablet by mouth daily as needed for Erectile Dysfunction. 11/24/21       Family History Family History  Problem Relation Age of Onset   Skin cancer Father    Hypertension Father    Breast cancer Mother    Stomach cancer Mother    Pancreatic cancer Brother    Colon cancer Neg Hx    Esophageal cancer Neg Hx    Rectal cancer Neg Hx    Colon polyps Neg Hx     Social History Social History   Tobacco Use   Smoking status: Former    Current packs/day: 2.00    Average packs/day: 2.0 packs/day for 14.0 years (28.0 ttl pk-yrs)    Types: Cigarettes   Smokeless tobacco: Never  Vaping Use   Vaping status: Never Used  Substance Use Topics   Alcohol use: Yes    Alcohol/week: 21.0 standard drinks of alcohol    Types: 21 Cans of beer per week    Comment: 2-3 shots of liq   Drug use: No     Allergies   Ciprofloxacin and Colchicine   Review of Systems Review of Systems  Constitutional: Negative.   HENT: Negative.     Respiratory: Negative.    Cardiovascular:  Positive for leg swelling.  Gastrointestinal:  Positive for abdominal distention. Negative for abdominal pain.  Skin:  Positive for color change and rash.     Physical Exam Triage Vital Signs ED Triage Vitals  Encounter Vitals Group     BP 02/24/23 1346 124/72     Systolic BP Percentile --      Diastolic BP Percentile --  Pulse Rate 02/24/23 1346 (!) 57     Resp 02/24/23 1346 15     Temp 02/24/23 1346 98.5 F (36.9 C)     Temp Source 02/24/23 1346 Oral     SpO2 02/24/23 1346 96 %     Weight --      Height --      Head Circumference --      Peak Flow --      Pain Score 02/24/23 1348 0     Pain Loc --      Pain Education --      Exclude from Growth Chart --    No data found.  Updated Vital Signs BP 124/72 (BP Location: Left Arm)   Pulse (!) 57   Temp 98.5 F (36.9 C) (Oral)   Resp 15   SpO2 96%   Visual Acuity Right Eye Distance:   Left Eye Distance:   Bilateral Distance:    Right Eye Near:   Left Eye Near:    Bilateral Near:     Physical Exam Constitutional:      General: He is not in acute distress.    Appearance: Normal appearance. He is not ill-appearing.  Cardiovascular:     Rate and Rhythm: Normal rate and regular rhythm.  Pulmonary:     Effort: Pulmonary effort is normal.     Breath sounds: Normal breath sounds.  Skin:    Comments: Small patch of petechial rash at the mid upper shin;  nontender, nonblanching;  similar rash along the right shin as well;  No calf pain or tenderness;  He does have 2+edema to the lower legs/ankles  Neurological:     General: No focal deficit present.     Mental Status: He is alert.  Psychiatric:        Mood and Affect: Mood normal.      UC Treatments / Results  Labs (all labs ordered are listed, but only abnormal results are displayed) Labs Reviewed - No data to display  EKG   Radiology IR Paracentesis  Result Date: 02/24/2023 INDICATION: Patient with  alcoholic cirrhosis of the liver presents today with new onset ascites. Diagnostic and therapeutic paracentesis requested. EXAM: ULTRASOUND GUIDED PARACENTESIS MEDICATIONS: 1% lidocaine 10 mL COMPLICATIONS: None immediate. PROCEDURE: Informed written consent was obtained from the patient after a discussion of the risks, benefits and alternatives to treatment. A timeout was performed prior to the initiation of the procedure. Initial ultrasound scanning demonstrates a large amount of ascites within the right lower abdominal quadrant. The right lower abdomen was prepped and draped in the usual sterile fashion. 1% lidocaine was used for local anesthesia. Following this, a 19 gauge, 7-cm, Yueh catheter was introduced. An ultrasound image was saved for documentation purposes. The paracentesis was performed. The catheter was removed and a dressing was applied. The patient tolerated the procedure well without immediate post procedural complication. Patient received post-procedure intravenous albumin; see nursing notes for details. FINDINGS: A total of approximately 2.5 L of clear yellow fluid was removed. Samples were sent to the laboratory as requested by the clinical team. IMPRESSION: Successful ultrasound-guided paracentesis yielding 2.5 L of peritoneal fluid. Procedure performed by Alwyn Ren NP PLAN: If the patient eventually requires >/=2 paracenteses in a 30 day period, candidacy for formal evaluation by the [facility] Interventional Radiology Portal Hypertension Clinic will be assessed. Roanna Banning, MD [facility] Radiology Electronically Signed   By: Roanna Banning M.D.   On: 02/24/2023 13:34  Procedures Procedures (including critical care time)  Medications Ordered in UC Medications - No data to display  Initial Impression / Assessment and Plan / UC Course  I have reviewed the triage vital signs and the nursing notes.  Pertinent labs & imaging  results that were available during my care of the patient were reviewed by me and considered in my medical decision making (see chart for details).   Final Clinical Impressions(s) / UC Diagnoses   Final diagnoses:  Lower extremity edema  Petechial rash     Discharge Instructions      You were seen today for a rash to the lower extremities.  This is likely due to the chronic swelling and compression wrap that was applied.  I see no evidence of infection or side affect to medication.  Please keep an eye on this for now.  Keep your legs elevated and wear your compression stockings to help with swelling.   If you develop worsening edema, pain or shortness of breath then please go to the ER for further evaluation.     ED Prescriptions   None    PDMP not reviewed this encounter.   Jannifer Franklin, MD 02/24/23 [phone]

## 2023-02-24 NOTE — ED Triage Notes (Signed)
Pt states he woke up today with a rash on bilateral legs. Started 2 new diuretic meds and states that his legs were wrapped this past Monday.

## 2023-02-24 NOTE — Procedures (Signed)
PROCEDURE SUMMARY:  Successful ultrasound guided paracentesis from the right lower quadrant.  Yielded 2.5 L of clear yellow fluid.  No immediate complications.  The patient tolerated the procedure well.   Specimen sent for labs.  EBL < 2 mL  If the patient eventually requires >/=2 paracenteses in a 30 day period, screening evaluation by the [facility] Interventional Radiology Portal Hypertension Clinic will be assessed.  Alwyn Ren, [place] [phone] 02/24/2023, 1:06 PM

## 2023-02-24 NOTE — Discharge Instructions (Signed)
You were seen today for a rash to the lower extremities.  This is likely due to the chronic swelling and compression wrap that was applied.  I see no evidence of infection or side affect to medication.  Please keep an eye on this for now.  Keep your legs elevated and wear your compression stockings to help with swelling.   If you develop worsening edema, pain or shortness of breath then please go to the ER for further evaluation.

## 2023-02-25 ENCOUNTER — Telehealth: Payer: Self-pay | Admitting: Internal Medicine

## 2023-02-25 LAB — PATHOLOGIST SMEAR REVIEW

## 2023-02-25 NOTE — Telephone Encounter (Signed)
Spoke with patient regarding results & MD recommendations. Pt will be here on 11/20 at 11:30 am.

## 2023-02-25 NOTE — Telephone Encounter (Signed)
Let him know no infection or other problems found in the fluid drawn off during paracentesis  See if he could see me at 1130 or 350 on 11/20 and add on please

## 2023-03-01 LAB — CULTURE, BODY FLUID W GRAM STAIN -BOTTLE: Culture: NO GROWTH

## 2023-03-02 ENCOUNTER — Encounter: Payer: Self-pay | Admitting: Internal Medicine

## 2023-03-02 ENCOUNTER — Other Ambulatory Visit (INDEPENDENT_AMBULATORY_CARE_PROVIDER_SITE_OTHER): Payer: PPO

## 2023-03-02 ENCOUNTER — Telehealth: Payer: Self-pay | Admitting: Internal Medicine

## 2023-03-02 ENCOUNTER — Ambulatory Visit: Payer: PPO | Admitting: Internal Medicine

## 2023-03-02 VITALS — BP 110/70 | HR 58 | Ht 72.0 in | Wt 198.0 lb

## 2023-03-02 DIAGNOSIS — Z7901 Long term (current) use of anticoagulants: Secondary | ICD-10-CM

## 2023-03-02 DIAGNOSIS — Z860101 Personal history of adenomatous and serrated colon polyps: Secondary | ICD-10-CM

## 2023-03-02 DIAGNOSIS — K7031 Alcoholic cirrhosis of liver with ascites: Secondary | ICD-10-CM | POA: Diagnosis not present

## 2023-03-02 DIAGNOSIS — R188 Other ascites: Secondary | ICD-10-CM

## 2023-03-02 DIAGNOSIS — Z23 Encounter for immunization: Secondary | ICD-10-CM | POA: Diagnosis not present

## 2023-03-02 LAB — BASIC METABOLIC PANEL
BUN: 8 mg/dL (ref 6–23)
CO2: 33 meq/L — ABNORMAL HIGH (ref 19–32)
Calcium: 9.3 mg/dL (ref 8.4–10.5)
Chloride: 97 meq/L (ref 96–112)
Creatinine, Ser: 0.81 mg/dL (ref 0.40–1.50)
GFR: 87.95 mL/min (ref >60.00)
Glucose, Bld: 119 mg/dL — ABNORMAL HIGH (ref 70–99)
Potassium: 4 meq/L (ref 3.5–5.1)
Sodium: 136 meq/L (ref 135–145)

## 2023-03-02 NOTE — Telephone Encounter (Signed)
This encounter was created in error - please disregard.

## 2023-03-02 NOTE — Patient Instructions (Signed)
We are placing a referral to [facility] to see [name], NP. They are located at [address] [place] [address], phone # [phone]. They will contact you with an appointment date/time.  We will call you with your lab results.  Stop all alcohol.  Today we are giving you a Heplisav vaccine.    I appreciate the opportunity to care for you. Stan Head, MD, [facility]

## 2023-03-02 NOTE — Progress Notes (Addendum)
Darren Gay 72 y.o. 08/01/1950 [id number]  Assessment & Plan:   Encounter Diagnoses  Name Primary?   Alcoholic cirrhosis of liver with ascites (HCC) Yes   Other ascites    Long term current use of anticoagulant - eliquis    Hx of adenomatous colonic polyps     The patient is significantly improved with diuretic therapy and paracentesis.  He continues to drink and is counseled that alcohol cessation is important.  He is encouraged to pursue alcohol rehab.  I explained that I do not use medications that prevent alcohol consumption and suggested he pursue that with experience practitioners in that regard.  Plan for hepatitis B vaccine.  He was vaccinated in the past he thinks due to a job but he does not have immunity so we will revaccinate.  Other vaccinations through primary care.   Continue carvedilol.  I am going to refer him to the [facility] in [place].  Await any additional recommendations.  Otherwise continue spironolactone and furosemide.  He had a BMP performed prior to the visit today and results have returned afterwards.  This is acceptable.  We will contact the patient, I had deferred scheduling the colonoscopy for his polyps when seen the last time and did not discuss that with him today but we will contact him and schedule a follow-up for February to reassess and schedule colonoscopy if medically acceptable.  I think it will be.  Will need to hold Eliquis 2 days prior to the procedure.  We can get an INR at that time and get a more accurate assessment of his coagulation and calculate MELD and Child score.  I do think with an INR of 1.6 while on Eliquis and with his other numbers he is an acceptable patient to continue his Eliquis but I await hepatology input as well.  Recent Labs  Lab 03/02/23 1102  NA 136  K 4.0  CL 97  CO2 33*  GLUCOSE 119*  BUN 8  CREATININE 0.81  CALCIUM 9.3   CC: Darren Rutherford, NP   Subjective:   Gastroenterology  summary:  Alcoholic cirrhosis of the liver with ascites and also presenting with anasarca diagnosed Fall 2024 CT abdomen pelvis and ultrasound, [facility].-question component of metabolic syndrome 2.5 L paracentesis 02/24/2023.  SAAG greater than 1.1 consistent with portal hypertension.  Cell count negative for SBP culture negative.  Hyperammonemia without obvious hepatic encephalopathy.  Furosemide and spironolactone initiated also.  Patient continues to drink.  Serologic workup with hepatitis C and B serologies (negative and no immunity to hepatitis B), immune to hepatitis A.  Iron studies do not indicate hemochromatosis, AFP 4.3, alpha-1 antitrypsin slightly high, mitochondrial antibodies negative, anti-smooth muscle antibodies negative, ANA is positive at 1:40  Patient on carvedilol also no EGD performed.  Unable to calculate MELD or child's score accurately because the patient is on Eliquis which can alter INR.    History of adenomatous colon polyps 03/2014 3 adenomas max 10 mm 09/20/2017 diminutive polyp - adenoma   ------------------------------------------------------------------------------------------------------------------------------------- Chief Complaint: Follow-up of cirrhosis and ascites, plus edema  HPI 72 year old white man with recently diagnosed cirrhosis as mentioned above, presents for follow-up.  He is lost a considerable amount of weight and can wear shoes that he has not worn in years now.  Combination of furosemide/spironolactone plus the paracentesis as above.  After he started to have less lower extremity edema he had quite a problem with erythematous skin and he went to the ER and  was reassured.  That has resolved.  No fever no signs of cellulitis.  He reports that he continues to use alcohol, though he is trying to cut back it is a significant part of his social life.  He has not pursued alcohol rehab.  Primary care provider has raised the possibility of using  medications to prevent drinking.  Wt Readings from Last 3 Encounters:  03/02/23 198 lb (89.8 kg)  02/15/23 220 lb (99.8 kg)  06/24/22 217 lb (98.4 kg)    Allergies  Allergen Reactions   Ciprofloxacin     Hx tendon rupture   Colchicine Other (See Comments)    makes me fatigue   Current Meds  Medication Sig   allopurinol (ZYLOPRIM) 100 MG tablet Take 200 mg by mouth daily.    amiodarone (PACERONE) 200 MG tablet Take by mouth.   Ascorbic Acid (VITAMIN C) 1000 MG tablet Take 1,000 mg by mouth daily.   carvedilol (COREG) 6.25 MG tablet Take 6.25 mg by mouth 2 (two) times daily with a meal.   ELIQUIS 5 MG TABS tablet Take 5 mg by mouth 2 (two) times daily.   fexofenadine (ALLEGRA) 180 MG tablet Take 180 mg by mouth as needed for allergies or rhinitis.   FLUoxetine (PROZAC) 20 MG capsule Take 1 capsule by mouth daily   fluticasone (FLONASE) 50 MCG/ACT nasal spray Place 1 spray into both nostrils daily as needed for allergies.    furosemide (LASIX) 40 MG tablet Take 40 mg by mouth daily.   gabapentin (NEURONTIN) 300 MG capsule Take 300 mg by mouth as needed.   omeprazole (PRILOSEC) 40 MG capsule Take 40 mg by mouth daily.   potassium chloride (KLOR-CON M) 10 MEQ tablet Take 10 mEq by mouth daily.   Sodium Fluoride 1.1 % PSTE Apply to toothbrush at bedtime as directed   spironolactone (ALDACTONE) 50 MG tablet Take 2 tablets (100 mg total) by mouth daily.   Past Medical History:  Diagnosis Date   Alcoholic cirrhosis of liver (HCC)    Allergy    Arthritis    Atrial fibrillation (HCC)    Depression    Gout    High cholesterol    borderline   Hx of adenomatous colonic polyps 03/29/2014   Hypertension    Pulmonary nodules    Sleep apnea    wears cpap   Substance abuse [facility])    Past Surgical History:  Procedure Laterality Date   ANKLE SURGERY     tendon repair   BUNIONECTOMY     CARPAL TUNNEL RELEASE Bilateral    COLONOSCOPY     heart ablation  01/26/2023   IR PARACENTESIS   02/24/2023   KNEE ARTHROSCOPY  2010   LYMPHADENECTOMY Bilateral 06/21/2019   Procedure: LYMPHADENECTOMY, PELVIC;  Surgeon: Heloise Purpura, MD;  Location: [facility];  Service: Urology;  Laterality: Bilateral;   ROBOT ASSISTED LAPAROSCOPIC RADICAL PROSTATECTOMY N/A 06/21/2019   Procedure: XI ROBOTIC ASSISTED LAPAROSCOPIC RADICAL PROSTATECTOMY LEVEL 2;  Surgeon: Heloise Purpura, MD;  Location: [facility];  Service: Urology;  Laterality: N/A;   TONSILLECTOMY  1960   VASECTOMY  1991   Social History   Social History Narrative   Married to Power    2 daughters   pt lives at home with his family.Caffeine Use: 1 cup daily   4 shots of whiskey or more daily times years   Former smoker no current tobacco or drug use   family history includes Breast cancer in his mother; Hypertension in his  father; Pancreatic cancer in his brother; Skin cancer in his father; Stomach cancer in his mother.   Review of Systems As per HPI  Objective:   Physical Exam BP 110/70   Pulse (!) 58   Ht 6' (1.829 m)   Wt 198 lb (89.8 kg)   BMI 26.85 kg/m  Elderly white man no acute distress Eyes are anicteric Lungs are clear Heart sounds are normal S1-S2 The abdomen is protuberant and distended and has moderate ascites.  Soft and nontender liver edge firm and palpable 2.5 fingerbreadths below the costal margin.  I cannot palpate a spleen. Extremities with trace edema in the ankles and pretibial area, there is some spotty hyperpigmentation that is brown but no signs of erythema. He appears are alert and oriented x 3.

## 2023-03-02 NOTE — Telephone Encounter (Signed)
Please let the patient know that his labs looked okay no significant issues.  I would like to see him in February, and we can review things and consider scheduling a colonoscopy which was due in 2024 that was not urgent, due to a history of colon polyps.  Please arrange that appointment.

## 2023-03-03 NOTE — Telephone Encounter (Signed)
Spoke with patient & wife regarding MD recommendations. He did inquire about his glucose & patient was educated on this lab and what it references to. Scheduled OV for 06/01/23 at 9:30 am with Dr. Leone Payor.

## 2023-03-16 DIAGNOSIS — K7031 Alcoholic cirrhosis of liver with ascites: Secondary | ICD-10-CM | POA: Diagnosis not present

## 2023-03-16 MED ORDER — LIDOCAINE HCL (PF) 1 % IJ SOLN
10.0000 mL | Freq: Once | INTRAMUSCULAR | Status: AC
  Administered 2023-03-16: 10 mL

## 2023-04-08 ENCOUNTER — Ambulatory Visit: Payer: PPO | Admitting: Internal Medicine

## 2023-04-08 DIAGNOSIS — Z23 Encounter for immunization: Secondary | ICD-10-CM | POA: Diagnosis not present

## 2023-04-08 DIAGNOSIS — K7031 Alcoholic cirrhosis of liver with ascites: Secondary | ICD-10-CM

## 2023-04-25 ENCOUNTER — Ambulatory Visit: Payer: PPO | Admitting: Internal Medicine

## 2023-05-24 ENCOUNTER — Other Ambulatory Visit: Payer: Self-pay | Admitting: Nurse Practitioner

## 2023-05-24 DIAGNOSIS — N4340 Spermatocele of epididymis, unspecified: Secondary | ICD-10-CM

## 2023-05-30 ENCOUNTER — Ambulatory Visit
Admission: RE | Admit: 2023-05-30 | Discharge: 2023-05-30 | Disposition: A | Discharge status: Home / Self Care | Payer: PPO | Source: Ambulatory Visit | Attending: Nurse Practitioner

## 2023-05-30 DIAGNOSIS — N4340 Spermatocele of epididymis, unspecified: Secondary | ICD-10-CM

## 2023-06-01 ENCOUNTER — Telehealth: Payer: Self-pay

## 2023-06-01 ENCOUNTER — Ambulatory Visit: Payer: PPO | Admitting: Internal Medicine

## 2023-06-01 ENCOUNTER — Encounter: Payer: Self-pay | Admitting: Internal Medicine

## 2023-06-01 VITALS — BP 110/72 | HR 61 | Ht 72.0 in | Wt 203.0 lb

## 2023-06-01 DIAGNOSIS — I4891 Unspecified atrial fibrillation: Secondary | ICD-10-CM | POA: Diagnosis not present

## 2023-06-01 DIAGNOSIS — K7031 Alcoholic cirrhosis of liver with ascites: Secondary | ICD-10-CM

## 2023-06-01 DIAGNOSIS — Z7901 Long term (current) use of anticoagulants: Secondary | ICD-10-CM

## 2023-06-01 DIAGNOSIS — Z860101 Personal history of adenomatous and serrated colon polyps: Secondary | ICD-10-CM | POA: Diagnosis not present

## 2023-06-01 MED ORDER — SUFLAVE 178.7 G PO SOLR: 1.0000 | Freq: Once | ORAL | 0 refills | Status: AC

## 2023-06-01 MED ORDER — SPIRONOLACTONE 50 MG PO TABS: 50.0000 mg | ORAL_TABLET | Freq: Every day | ORAL | 3 refills | Status: AC

## 2023-06-01 NOTE — Progress Notes (Signed)
 Darren Gay 73 y.o. [birth year] [id number]  Assessment & Plan:   Encounter Diagnoses  Name Primary?   Alcoholic cirrhosis of liver with ascites (HCC) Yes   Hx of adenomatous colonic polyps    Long term current use of anticoagulant - eliquis    Atrial fibrillation, unspecified type Darren Gay)     Schedule colonoscopy for polyp surveillance.  Hold Eliquis x 2 days clarify with cardiology.  Rare but real increased risk of stroke off Eliquis explained to the patient and he understands and accepts those risks and the other traditional risks of colonoscopy.  When the patient presents for colonoscopy office Eliquis will check INR to get a more accurate level of that.  Keep new patient evaluation with [facility]  (Darren Major, NP).  Continue new doses of carvedilol, spironolactone and furosemide.  He had some hypotension and dose reduction has alleviated that and he does not seem to be accumulating excess fluid to any great degree.  Continue alcohol cessation efforts and treatment.  Appreciate Ms. Darren Gay' help  CC: Darren Rutherford, NP  Subjective:  Gastroenterology summary:   Alcoholic cirrhosis of the liver with ascites and also presenting with anasarca diagnosed Fall 2024 CT abdomen pelvis and ultrasound, [facility].-question component of metabolic syndrome 2.5 L paracentesis 02/24/2023.  SAAG greater than 1.1 consistent with portal hypertension.  Cell count negative for SBP culture negative.  Hyperammonemia without obvious hepatic encephalopathy.  Furosemide and spironolactone initiated also.  Patient continues to drink.   Serologic workup with hepatitis C and B serologies (negative and no immunity to hepatitis B), immune to hepatitis A.  Iron studies do not indicate hemochromatosis, AFP 4.3, alpha-1 antitrypsin slightly high, mitochondrial antibodies negative, anti-smooth muscle antibodies negative, ANA is positive at 1:40   Patient on carvedilol also no EGD  performed.  Unable to calculate MELD or child's score accurately because the patient is on Eliquis which can alter INR.    Revaccinated HBV 11 and 12/24   History of adenomatous colon polyps 03/2014 3 adenomas max 10 mm 09/20/2017 diminutive polyp - adenoma   Chief Complaint: Cirrhosis and polyp follow-up  HPI 73 year old white man with a history of atrial fibrillation on Eliquis, alcoholic cirrhosis with ascites, colon polyps as outlined above who presents for a follow-up visit today.  In the interim since last visit in the fall 2024, he experienced some hypotension issues he was seen on 05/19/2022 and 05/27/2023 by primary care.  He experienced some dizziness and his blood pressure was 84/56.  Carvedilol was decreased to 3.125 mg twice daily and furosemide was temporarily reduced.  Plans to start naltrexone and acamprosate were discussed as well.  At follow-up on the 14th he felt better and his blood pressure was improved.  Cardiology visit 05/10/2023.  Notes indicate he is being referred for a Watchman device.  His potassium was up to 5.5 so his potassium supplement was held and his spironolactone was reduced.  Darren Sia NP prescribed acamprosate and naltrexone to treat alcohol dependence and he has been started those medications and his last drink was about 4 days ago. Wt Readings from Last 3 Encounters:  06/01/23 203 lb (92.1 kg)  03/02/23 198 lb (89.8 kg)  02/15/23 220 lb (99.8 kg)   Labs 05/20/2023 AST 67 ALT 55 alk phos 118 bilirubin 0.7 Albumin 4.3 Sodium 138 Potassium 4.2 GFR 59 creatinine 1.29 compared to 1.22 on January 23 CBC with platelets 142 hemoglobin 14.9 white count 5.8 Allergies  Allergen Reactions  Ciprofloxacin     Hx tendon rupture   Colchicine Other (See Comments)    makes me fatigue   Current Meds  Medication Sig   acamprosate (CAMPRAL) 333 MG tablet Take by mouth.   allopurinol (ZYLOPRIM) 100 MG tablet Take 200 mg by mouth daily.    Ascorbic Acid  (VITAMIN C) 1000 MG tablet Take 1,000 mg by mouth daily.   carvedilol (COREG) 6.25 MG tablet Take 3.125 mg by mouth 2 (two) times daily with a meal.   ELIQUIS 5 MG TABS tablet Take 5 mg by mouth 2 (two) times daily.   fexofenadine (ALLEGRA) 180 MG tablet Take 180 mg by mouth as needed for allergies or rhinitis.   FLUoxetine (PROZAC) 20 MG capsule Take 1 capsule by mouth daily   fluticasone (FLONASE) 50 MCG/ACT nasal spray Place 1 spray into both nostrils daily as needed for allergies.    furosemide (LASIX) 40 MG tablet Take 20 mg by mouth daily.   gabapentin (NEURONTIN) 300 MG capsule Take 300 mg by mouth as needed.   naltrexone (DEPADE) 50 MG tablet Take 25 mg by mouth at bedtime.   omeprazole (PRILOSEC) 40 MG capsule Take 40 mg by mouth daily.   Sodium Fluoride 1.1 % PSTE Apply to toothbrush at bedtime as directed   spironolactone (ALDACTONE) 50 MG tablet Take 2 tablets (100 mg total) by mouth daily.   tadalafil (CIALIS) 20 MG tablet Take 1 tablet by mouth daily as needed for Erectile Dysfunction.   Past Medical History:  Diagnosis Date   Alcoholic cirrhosis of liver (HCC)    Allergy    Arthritis    Atrial fibrillation (HCC)    Depression    Gout    High cholesterol    borderline   Hx of adenomatous colonic polyps 03/29/2014   Hypertension    Pulmonary nodules    Sleep apnea    wears cpap   Substance abuse [facility])    Past Surgical History:  Procedure Laterality Date   ANKLE SURGERY     tendon repair   BUNIONECTOMY     CARPAL TUNNEL RELEASE Bilateral    COLONOSCOPY     heart ablation  01/26/2023   IR PARACENTESIS  02/24/2023   KNEE ARTHROSCOPY  2010   LYMPHADENECTOMY Bilateral 06/21/2019   Procedure: LYMPHADENECTOMY, PELVIC;  Surgeon: Heloise Purpura, MD;  Location: [facility];  Service: Urology;  Laterality: Bilateral;   ROBOT ASSISTED LAPAROSCOPIC RADICAL PROSTATECTOMY N/A 06/21/2019   Procedure: XI ROBOTIC ASSISTED LAPAROSCOPIC RADICAL PROSTATECTOMY LEVEL 2;  Surgeon: Heloise Purpura, MD;  Location: [facility];  Service: Urology;  Laterality: N/A;   TONSILLECTOMY  1960   VASECTOMY  1991   Social History   Social History Narrative   Married to Darren Gay    2 daughters   pt lives at home with his family.Caffeine Use: 1 cup daily   4 shots of whiskey or more daily times years   Former smoker no current tobacco or drug use   family history includes Breast cancer in his mother; Hypertension in his father; Pancreatic cancer in his brother; Skin cancer in his father; Stomach cancer in his mother.   Review of Systems As per HPI  Objective:   Physical Exam BP 110/72   Pulse 61   Ht 6' (1.829 m)   Wt 203 lb (92.1 kg)   BMI 27.53 kg/m  @BP  110/72   Pulse 61   Ht 6' (1.829 m)   Wt 203 lb (92.1 kg)  BMI 27.53 kg/m @  General:  Well-developed, well-nourished and in no acute distress Eyes:  anicteric.  Lungs: Clear to auscultation bilaterally. Heart:   S1S2, no rubs, murmurs, gallops. Abdomen:  soft, non-tender, no hepatosplenomegaly, small umbilical hernia, or mass and BS+. Diastasis recti Lymph:  no cervical or supraclavicular adenopathy. Extremities:   no edema, cyanosis or clubbing Neuro:  A&O x 3.  Psych:  appropriate mood and  Affect.   Data Reviewed: As per HPI

## 2023-06-01 NOTE — Telephone Encounter (Signed)
 Anti-coag clearance letter for Eliquis  faxed to Dr Marcille Blanco office at fax # [phone], phone # [phone]. He is with [facility] in [facility].

## 2023-06-01 NOTE — Patient Instructions (Signed)
 You have been scheduled for a colonoscopy. Please follow written instructions given to you at your visit today.   If you use inhalers (even only as needed), please bring them with you on the day of your procedure.  You will be contaced by our office prior to your procedure for directions on holding your Eliquis. If you do not hear from our office 1 week prior to your scheduled procedure, please call [phone] to discuss. Ask for PJ, CMA   DO NOT TAKE 7 DAYS PRIOR TO TEST- Trulicity (dulaglutide) Ozempic, Wegovy (semaglutide) Mounjaro (tirzepatide) Bydureon Bcise (exanatide extended release)  DO NOT TAKE 1 DAY PRIOR TO YOUR TEST Rybelsus (semaglutide) Adlyxin (lixisenatide) Victoza (liraglutide) Byetta (exanatide) ___________________________________________________________________________  Bonita Quin will receive your bowel preparation through [facility], which ensures the lowest copay and home delivery, with outreach via text or call from an [phone] number. Please respond promptly to avoid rescheduling of your procedure. If you are interested in alternative options or have any questions regarding your prep, please contact them at [phone] ____________________________________________________________________________  Your Provider Has Sent Your Bowel Prep Regimen To [facility]   [facility] will contact you to verify your information and collect your copay, if applicable. Enjoy the comfort of your home while your prescription is mailed to you, FREE of any shipping charges.   [facility] accepts all major insurance benefits and applies discounts & coupons.  Have additional questions?   Chat: [email or web address] Call: [phone] Email: [email or web address] NCPDP: [id number]  How will [facility] contact you?  With a Welcome phone call,  a Welcome text and a checkout link in text form.  Texts you receive from [phone] Are NOT Spam.  *To set up delivery, you must complete  the checkout process via link or speak to one of the patient care representatives. If [facility] is unable to reach you, your prescription may be delayed.  To avoid long hold times on the phone, you may also utilize the secure chat feature on the [facility] website to request that they call you back for transaction completion or to expedite your concerns.    I appreciate the opportunity to care for you. Stan Head, MD, [facility]

## 2023-06-02 NOTE — Telephone Encounter (Signed)
 Darren Gay has been informed and verbalized understanding to hold his Eliquis 2 days prior to his procedure. We received a fax from Dr Dyane Dustman at [facility] with this approval. Response letter sent to be scanned into his chart.

## 2023-06-14 ENCOUNTER — Ambulatory Visit: Payer: PPO | Admitting: Podiatry

## 2023-06-14 ENCOUNTER — Ambulatory Visit (INDEPENDENT_AMBULATORY_CARE_PROVIDER_SITE_OTHER)

## 2023-06-14 ENCOUNTER — Other Ambulatory Visit: Payer: Self-pay | Admitting: Podiatry

## 2023-06-14 ENCOUNTER — Encounter: Payer: Self-pay | Admitting: Podiatry

## 2023-06-14 DIAGNOSIS — G621 Alcoholic polyneuropathy: Secondary | ICD-10-CM

## 2023-06-14 DIAGNOSIS — M7752 Other enthesopathy of left foot: Secondary | ICD-10-CM

## 2023-06-15 NOTE — Progress Notes (Signed)
 He presents today concerned about the dorsal forefoot where he had previous surgeries.  States that he still gets some swelling and some pain at the base of the second toe as well as the third toe.  He takes 500 mg of gabapentin at bedtime and he states that is really not helping and does not want to have to take more medication.  He takes the medication for neuropathy which she relates through discussion possibly alcohol etiology.  Objective: Vital signs are stable alert and oriented x 3.  Pulses are palpable.  Left foot demonstrates more swelling than that of the right with hyperpigmentation overlying the dorsal distal aspect of the foot left no open lesions or wounds.  Foot deformity consists of hallux valgus with hallux malleus lateralization of toes 2 and 3 left he has what appears to be a bony bridge between the second and third metatarsals.  Neurologic sensorium is diminished per Semmes Weinstein monofilament.  Radiographs taken today demonstrate osseously mature individual hypertrophic bone growth along the lateral side of the first metatarsal from a previous capital osteotomy.  Retention of a second metatarsal Hemi implant and screw to the third metatarsal head.  There does appear to be proliferative bone growth around the second and third metatarsals but does not coalesce.  Impression: Most likely pain is symptomatic due to the peripheral neuropathy primarily nocturnal.  Plan: Discussed etiology pathology conservative versus surgical therapies with him at this time we did discuss fusion of the first metatarsal phalangeal joint and resection of the heads of the second third fourth and fifth metatarsals with pinning of the toes.  He understands this but is opting for more testing of the neuropathy.  We did discuss some conservative therapies.  We are going to send him to neurology for evaluation EMG and NCVE.  We will await those findings.  Referral was placed for [facility] neurology

## 2023-06-24 ENCOUNTER — Other Ambulatory Visit: Payer: Self-pay | Admitting: Nurse Practitioner

## 2023-06-24 DIAGNOSIS — K7031 Alcoholic cirrhosis of liver with ascites: Secondary | ICD-10-CM

## 2023-06-24 DIAGNOSIS — R188 Other ascites: Secondary | ICD-10-CM

## 2023-07-01 ENCOUNTER — Ambulatory Visit
Admission: RE | Admit: 2023-07-01 | Discharge: 2023-07-01 | Disposition: A | Discharge status: Home / Self Care | Source: Ambulatory Visit | Attending: Nurse Practitioner | Admitting: Nurse Practitioner

## 2023-07-01 DIAGNOSIS — R188 Other ascites: Secondary | ICD-10-CM

## 2023-07-01 DIAGNOSIS — K7031 Alcoholic cirrhosis of liver with ascites: Secondary | ICD-10-CM

## 2023-07-11 ENCOUNTER — Encounter: Payer: Self-pay | Admitting: Internal Medicine

## 2023-07-19 ENCOUNTER — Encounter: Payer: PPO | Admitting: Internal Medicine

## 2023-07-21 ENCOUNTER — Other Ambulatory Visit (HOSPITAL_COMMUNITY): Payer: Self-pay | Admitting: Orthopedic Surgery

## 2023-07-21 ENCOUNTER — Ambulatory Visit (HOSPITAL_COMMUNITY)
Admission: RE | Admit: 2023-07-21 | Discharge: 2023-07-21 | Disposition: A | Discharge status: Home / Self Care | Source: Ambulatory Visit | Attending: Orthopedic Surgery | Admitting: Orthopedic Surgery

## 2023-07-21 DIAGNOSIS — M5416 Radiculopathy, lumbar region: Secondary | ICD-10-CM

## 2023-07-21 DIAGNOSIS — M545 Low back pain, unspecified: Secondary | ICD-10-CM | POA: Diagnosis present

## 2023-08-11 HISTORY — PX: OTHER SURGICAL HISTORY: SHX169

## 2023-08-18 ENCOUNTER — Other Ambulatory Visit: Payer: Self-pay | Admitting: Orthopedic Surgery

## 2023-08-18 ENCOUNTER — Ambulatory Visit
Admission: RE | Admit: 2023-08-18 | Discharge: 2023-08-18 | Disposition: A | Discharge status: Home / Self Care | Source: Ambulatory Visit | Attending: Orthopedic Surgery | Admitting: Orthopedic Surgery

## 2023-08-18 DIAGNOSIS — M4856XS Collapsed vertebra, not elsewhere classified, lumbar region, sequela of fracture: Secondary | ICD-10-CM

## 2023-09-06 ENCOUNTER — Institutional Professional Consult (permissible substitution): Admitting: Diagnostic Neuroimaging

## 2023-09-07 ENCOUNTER — Encounter: Admitting: Internal Medicine

## 2023-09-08 ENCOUNTER — Ambulatory Visit: Admitting: Diagnostic Neuroimaging

## 2023-09-08 ENCOUNTER — Encounter: Payer: Self-pay | Admitting: Diagnostic Neuroimaging

## 2023-09-08 VITALS — BP 126/72 | HR 64 | Ht 71.0 in | Wt 193.0 lb

## 2023-09-08 DIAGNOSIS — M5416 Radiculopathy, lumbar region: Secondary | ICD-10-CM | POA: Diagnosis not present

## 2023-09-08 DIAGNOSIS — M21371 Foot drop, right foot: Secondary | ICD-10-CM | POA: Diagnosis not present

## 2023-09-08 DIAGNOSIS — G629 Polyneuropathy, unspecified: Secondary | ICD-10-CM | POA: Diagnosis not present

## 2023-09-08 DIAGNOSIS — M21372 Foot drop, left foot: Secondary | ICD-10-CM

## 2023-09-08 NOTE — Progress Notes (Signed)
 [facility]  PATIENT: Darren Gay DOB: 1951/03/28  REFERRING CLINICIAN: Hyatt, Max T, DPM HISTORY FROM: patient  REASON FOR VISIT: new consult    HISTORICAL  CHIEF COMPLAINT:  Chief Complaint  Patient presents with   Peripheral Neuropathy    Rm 7 with spouse Pt is well, reports he was having tingling, burning, sharp pains in both feet. He has had neuropathy for 4-5 yrs Since he broke his back, he is only having tingling around toes.     HISTORY OF PRESENT ILLNESS:   UPDATE (09/08/23, VRP): Since last visit, memory and brain fog are improved. Now here for eval of neuropathy in feet. Numbness, pain and burning in left > right foot / toes, since ~ 2019 / 2020 (following left foot surgeries). Also with chronic alcohol use, leading to liver cirrhosis and ascites, now abstinent since Jan / Feb 2025. In March 2025 fell and had lumbar compression fracture. Had L4 kyphoplasty Aug 26, 2023. Also had A1c 6.7 and now on metformin. Continues on gabapentin, now on 4 tabs of gabapentin 300mg  per day for the back issues.   PRIOR HPI (8920): 73 year old male here for evaluation of memory and cognitive difficulties.  Patient has had some mild issues since 2021 with some difficulty in short-term and long-term recall events.  When confronted with these discrepancies he had become somewhat frustrated and agitated.  Has been having some issues with hearing loss and pay attention.  Has some long-term issues related to depression and decreased motivation, energy, anhedonia.  Has been on Wellbutrin  and Prozac  in the past.  Symptoms somewhat more prominent in the last 1 year.  This has caused some strain at home.  He still able to maintain his ADLs.  He does note some more difficulty with driving and multitasking compared to previously.  Went to PCP for evaluation.  Patient was tried on Adderall without benefit.  Also started on memantine.   REVIEW OF SYSTEMS: Full 14 system review of  systems performed and negative with exception of: as per HPI.  ALLERGIES: Allergies  Allergen Reactions   Ciprofloxacin     Hx tendon rupture   Colchicine  Other (See Comments)    makes me fatigue    HOME MEDICATIONS: Outpatient Medications Prior to Visit  Medication Sig Dispense Refill   allopurinol  (ZYLOPRIM ) 100 MG tablet Take 200 mg by mouth daily.      Ascorbic Acid (VITAMIN C) 1000 MG tablet Take 1,000 mg by mouth daily.     carvedilol (COREG) 3.125 MG tablet Take 3.125 mg by mouth 2 (two) times daily with a meal.     desvenlafaxine (PRISTIQ) 25 MG 24 hr tablet Take 25 mg by mouth daily.     ELIQUIS 5 MG TABS tablet Take 5 mg by mouth 2 (two) times daily.     fexofenadine (ALLEGRA) 180 MG tablet Take 180 mg by mouth as needed for allergies or rhinitis.     FLUoxetine  (PROZAC ) 20 MG capsule Take 1 capsule by mouth daily 30 capsule 2   fluticasone  (FLONASE ) 50 MCG/ACT nasal spray Place 1 spray into both nostrils daily as needed for allergies.      furosemide (LASIX) 20 MG tablet Take 20 mg by mouth daily.     gabapentin (NEURONTIN) 300 MG capsule Take 300 mg by mouth as needed.     Sodium Fluoride  1.1 % PSTE Apply to toothbrush at bedtime as directed 100 mL 1   spironolactone  (ALDACTONE ) 50 MG tablet Take 1  tablet (50 mg total) by mouth daily. 90 tablet 3   tadalafil  (CIALIS ) 20 MG tablet Take 1 tablet by mouth daily as needed for Erectile Dysfunction. 20 tablet 2   acamprosate (CAMPRAL) 333 MG tablet Take by mouth. (Patient not taking: Reported on 09/08/2023)     naltrexone (DEPADE) 50 MG tablet Take 25 mg by mouth at bedtime.     omeprazole (PRILOSEC) 40 MG capsule Take 40 mg by mouth daily.     pantoprazole (PROTONIX) 40 MG tablet SMARTSIG:1 Tablet(s) By Mouth Every Evening     No facility-administered medications prior to visit.    PAST MEDICAL HISTORY: Past Medical History:  Diagnosis Date   Alcoholic cirrhosis of liver (HCC)    Allergy    Arthritis    Atrial  fibrillation (HCC)    Depression    Gout    High cholesterol    borderline   Hx of adenomatous colonic polyps 03/29/2014   Hypertension    Pulmonary nodules    Sleep apnea    wears cpap   Substance abuse (HCC)     PAST SURGICAL HISTORY: Past Surgical History:  Procedure Laterality Date   ANKLE SURGERY     tendon repair   BUNIONECTOMY     CARPAL TUNNEL RELEASE Bilateral    COLONOSCOPY     heart ablation  01/26/2023   IR PARACENTESIS  02/24/2023   KNEE ARTHROSCOPY  2010   kyphoplasty  08/2023   LYMPHADENECTOMY Bilateral 06/21/2019   Procedure: LYMPHADENECTOMY, PELVIC;  Surgeon: Florencio Hunting, MD;  Location: [facility];  Service: Urology;  Laterality: Bilateral;   ROBOT ASSISTED LAPAROSCOPIC RADICAL PROSTATECTOMY N/A 06/21/2019   Procedure: XI ROBOTIC ASSISTED LAPAROSCOPIC RADICAL PROSTATECTOMY LEVEL 2;  Surgeon: Florencio Hunting, MD;  Location: [facility];  Service: Urology;  Laterality: N/A;   TONSILLECTOMY  1960   VASECTOMY  1991    FAMILY HISTORY: Family History  Problem Relation Age of Onset   Skin cancer Father    Hypertension Father    Breast cancer Mother    Stomach cancer Mother    Pancreatic cancer Brother    Colon cancer Neg Hx    Esophageal cancer Neg Hx    Rectal cancer Neg Hx    Colon polyps Neg Hx     SOCIAL HISTORY: Social History   Socioeconomic History   Marital status: Married    Spouse name: Louanna Rouse   Number of children: 3   Years of education: 4y Trade   Highest education level: Not on file  Occupational History    Employer: [employer]    Comment: [employer]  Tobacco Use   Smoking status: Former    Current packs/day: 2.00    Average packs/day: 2.0 packs/day for 14.0 years (28.0 ttl pk-yrs)    Types: Cigarettes   Smokeless tobacco: Never  Vaping Use   Vaping status: Never Used  Substance and Sexual Activity   Alcohol use: Yes    Alcohol/week: 21.0 standard drinks of alcohol    Types: 21 Cans of beer per week    Comment: 2-3 shots  of liq   Drug use: No   Sexual activity: Yes    Birth control/protection: None  Other Topics Concern   Not on file  Social History Narrative   Married to Terra Bella    2 daughters   pt lives at home with his family.Caffeine Use: 1 cup daily   4 shots of whiskey or more daily times years   Former smoker no current  tobacco or drug use   Social Drivers of Corporate investment banker Strain: Low Risk  (05/17/2023)   Received from [facility]   Overall Financial Resource Strain (CARDIA)    Difficulty of Paying Living Expenses: Not very hard  Food Insecurity: Low Risk  (06/24/2023)   Received from [facility]   Hunger Vital Sign    Worried About Running Out of Food in the Last Year: Never true    Ran Out of Food in the Last Year: Never true  Transportation Needs: No Transportation Needs (06/24/2023)   Received from [facility]    In the past 12 months, has lack of reliable transportation kept you from medical appointments, meetings, work or from getting things needed for daily living? : No  Physical Activity: Unknown (05/17/2023)   Received from [facility]   Exercise Vital Sign    Days of Exercise per Week: Patient declined    Minutes of Exercise per Session: Not on file  Stress: No Stress Concern Present (05/17/2023)   Received from [facility] - Occupational Stress Questionnaire    Feeling of Stress : Not at all  Recent Concern: Stress - Stress Concern Present (02/20/2023)   Received from [facility] - Occupational Stress Questionnaire    Feeling of Stress : To some extent  Social Connections: Patient Declined (05/17/2023)   Received from [facility]   Social Network    How would you rate your social network (family, work, friends)?: Patient declined  Intimate Partner Violence: Not At Risk (02/20/2023)   Received from [facility]   HITS    Over the last 12 months how  often did your partner physically hurt you?: Never    Over the last 12 months how often did your partner insult you or talk down to you?: Rarely    Over the last 12 months how often did your partner threaten you with physical harm?: Never    Over the last 12 months how often did your partner scream or curse at you?: Never     PHYSICAL EXAM  GENERAL EXAM/CONSTITUTIONAL: Vitals:  Vitals:   09/08/23 1034  BP: 126/72  Pulse: 64  Weight: 193 lb (87.5 kg)  Height: 5\' 11"  (1.803 m)   Body mass index is 26.92 kg/m. Wt Readings from Last 3 Encounters:  09/08/23 193 lb (87.5 kg)  06/01/23 203 lb (92.1 kg)  03/02/23 198 lb (89.8 kg)   Patient is in no distress; well developed, nourished and groomed; neck is supple  CARDIOVASCULAR: Examination of carotid arteries is normal; no carotid bruits Regular rate and rhythm, no murmurs Examination of peripheral vascular system by observation and palpation is normal  EYES: Ophthalmoscopic exam of optic discs and posterior segments is normal; no papilledema or hemorrhages No results found.  MUSCULOSKELETAL: Gait, strength, tone, movements noted in Neurologic exam below  NEUROLOGIC: MENTAL STATUS:     06/24/2022    1:41 PM  MMSE - Mini Mental State Exam  Orientation to time 5  Orientation to Place 5  Registration 3  Attention/ Calculation 5  Recall 3  Language- name 2 objects 2  Language- repeat 1  Language- follow 3 step command 3  Language- read & follow direction 1  Write a sentence 1  Copy design 1  Total score 30   awake, alert, oriented to person, place and time recent and remote memory intact normal  attention and concentration language fluent, comprehension intact, naming intact fund of knowledge appropriate  CRANIAL NERVE:  2nd - no papilledema on fundoscopic exam 2nd, 3rd, 4th, 6th - pupils equal and reactive to light, visual fields full to confrontation, extraocular muscles intact, no nystagmus 5th - facial  sensation symmetric 7th - facial strength symmetric 8th - hearing intact 9th - palate elevates symmetrically, uvula midline 11th - shoulder shrug symmetric 12th - tongue protrusion midline  MOTOR:  normal bulk and tone, full strength in the BUE, BLE; EXCEPT RIGHT FOOT DF 3, LEFT FOOT DF 4  SENSORY:  normal and symmetric to light touch, temperature, vibration; EXCEPT DECR IN TOES  COORDINATION:  finger-nose-finger, fine finger movements normal  REFLEXES:  deep tendon reflexes TRACE and symmetric  GAIT/STATION:  narrow based gait; MILD STEPPAGE GAIT     DIAGNOSTIC DATA (LABS, IMAGING, TESTING) - I reviewed patient records, labs, notes, testing and imaging myself where available.  Lab Results  Component Value Date   WBC 6.7 02/11/2023   HGB 14.3 02/11/2023   HCT 42.1 02/11/2023   MCV 100.0 02/11/2023   PLT 226 02/11/2023      Component Value Date/Time   NA 136 03/02/2023 1102   K 4.0 03/02/2023 1102   CL 97 03/02/2023 1102   CO2 33 (H) 03/02/2023 1102   GLUCOSE 119 (H) 03/02/2023 1102   BUN 8 03/02/2023 1102   CREATININE 0.81 03/02/2023 1102   CREATININE 1.08 10/29/2014 1426   CALCIUM 9.3 03/02/2023 1102   PROT 6.3 (L) 02/11/2023 1602   ALBUMIN 3.2 (L) 02/11/2023 1602   AST 110 (H) 02/11/2023 1602   ALT 43 02/11/2023 1602   ALKPHOS 185 (H) 02/11/2023 1602   BILITOT 0.7 02/11/2023 1602   GFRNONAA >60 02/11/2023 1602   GFRAA >60 06/18/2019 1343   Lab Results  Component Value Date   CHOL 224 (H) 06/04/2009   HDL 58.80 06/04/2009   LDLDIRECT 150.6 06/04/2009   TRIG 205.0 (H) 06/04/2009   CHOLHDL 4 06/04/2009   No results found for: "HGBA1C" Lab Results  Component Value Date   VITAMINB12 1,186 06/24/2022   Lab Results  Component Value Date   TSH 1.290 06/24/2022   06/21/23 Component Ref Range & Units 2 mo ago Comments  Hemoglobin A1c 4.8 - 5.6 % 6.7 High      04/15/22 CT head [I reviewed images myself and agree with interpretation. -VRP]  - no  acute findings  07/21/23 MRI lumbar spine [I reviewed images myself and agree with interpretation. -VRP]  1. Acute compression fracture involving the L4 vertebral body with up to 70% height loss. 8 mm bony retropulsion and/or blood products within the adjacent ventral epidural space, with resultant moderate spinal stenosis at this level. 2. Associated 3.6 cm intramuscular hematoma within the left psoas muscle. 3. Chronic bilateral pars defects at L5 with associated 14 mm spondylolisthesis of L5 on S1. Resultant moderate to severe bilateral L5 foraminal stenosis. 4. Additional multilevel lumbar spondylosis as above. Resultant moderate spinal stenosis at L3-4, with mild to moderate bilateral L3 foraminal narrowing.    ASSESSMENT AND PLAN  73 y.o. year old male here with:   Dx:  1. Neuropathy   2. Bilateral foot-drop   3. Bilateral lumbar radiculopathy      PLAN:  NUMBNESS / PAIN IN FEET (L > R) --> peripheral neuropathies + lumbar radiculopathies - mild left foot / toe numbness started after multiple left foot surgeries (last surgery ~2019) - symptoms worsened over time;  then more gait difficulty and falls; contributing factors include chronic alcohol use, elevated glucose levels and lumbar radiculopathies - pain issues have now improved; recommend to continue gabapentin - refer to PT evaluation for foot drop; AFO eval  Orders Placed This Encounter  Procedures   Ambulatory referral to Physical Therapy   Return for pending if symptoms worsen or fail to improve.  I spent 41 minutes of face-to-face and non-face-to-face time with patient.  This included previsit chart review, lab review, study review, order entry, electronic health record documentation, patient education.     Omega Bible, MD 09/08/2023, 11:32 AM Certified in Neurology, Neurophysiology and Neuroimaging  [facility] [address], [address] [place], [place] [address] [phone]

## 2023-09-20 ENCOUNTER — Other Ambulatory Visit (HOSPITAL_COMMUNITY): Payer: Self-pay | Admitting: Neurosurgery

## 2023-09-20 DIAGNOSIS — M48062 Spinal stenosis, lumbar region with neurogenic claudication: Secondary | ICD-10-CM

## 2023-09-21 NOTE — Therapy (Signed)
 OUTPATIENT PHYSICAL THERAPY THORACOLUMBAR EVALUATION   Patient Name: Darren Gay MRN: [id number] DOB:1950-09-09, 73 y.o., male Today's Date: 09/22/2023  END OF SESSION:  PT End of Session - 09/22/23 1155     Visit Number 1    Number of Visits 1    Date for PT Re-Evaluation 10/21/23    Authorization Type HTA    Authorization Time Period no auth needed    PT Start Time 1151    PT Stop Time 1230    PT Time Calculation (min) 39 min    Activity Tolerance Patient tolerated treatment well    Behavior During Therapy [facility] for tasks assessed/performed          Past Medical History:  Diagnosis Date   Alcoholic cirrhosis of liver (HCC)    Allergy    Arthritis    Atrial fibrillation (HCC)    Depression    Gout    High cholesterol    borderline   Hx of adenomatous colonic polyps 03/29/2014   Hypertension    Pulmonary nodules    Sleep apnea    wears cpap   Substance abuse (HCC)    Past Surgical History:  Procedure Laterality Date   ANKLE SURGERY     tendon repair   BUNIONECTOMY     CARPAL TUNNEL RELEASE Bilateral    COLONOSCOPY     heart ablation  01/26/2023   IR PARACENTESIS  02/24/2023   KNEE ARTHROSCOPY  2010   kyphoplasty  08/2023   LYMPHADENECTOMY Bilateral 06/21/2019   Procedure: LYMPHADENECTOMY, PELVIC;  Surgeon: Florencio Hunting, MD;  Location: [facility];  Service: Urology;  Laterality: Bilateral;   ROBOT ASSISTED LAPAROSCOPIC RADICAL PROSTATECTOMY N/A 06/21/2019   Procedure: XI ROBOTIC ASSISTED LAPAROSCOPIC RADICAL PROSTATECTOMY LEVEL 2;  Surgeon: Florencio Hunting, MD;  Location: [facility];  Service: Urology;  Laterality: N/A;   TONSILLECTOMY  1960   VASECTOMY  1991   Patient Active Problem List   Diagnosis Date Noted   Vitamin D deficiency 02/21/2023   Alcoholic cirrhosis of liver with ascites (HCC) 02/15/2023   Long term current use of anticoagulant - eliquis 02/15/2023   Multiple pulmonary nodules 02/11/2023   S/P ablation of atrial fibrillation 01/26/2023    Gastroesophageal reflux disease without esophagitis 01/05/2023   Atrial fibrillation (HCC) 01/26/2022   Attention deficit hyperactivity disorder (ADHD), predominantly inattentive type 01/12/2021   History of prostate cancer 01/12/2021   Major depressive disorder, recurrent episode, moderate degree (HCC) 01/12/2021   Mixed hyperlipidemia 11/11/2020   Prediabetes 11/11/2020   Erectile dysfunction 10/31/2020   Isthmic spondylolisthesis 02/25/2020   Low back pain 02/25/2020   Prostate cancer (HCC) 06/21/2019   Tendon tear 08/08/2017   Acute pain of right shoulder 07/01/2017   Bunion, left 06/11/2016   Idiopathic chronic gout of left foot without tophus 06/11/2016   Hx of adenomatous colonic polyps 03/29/2014   Chest pain 01/13/2013   Plantar fasciitis of right foot 01/21/2012   TESTICULAR HYPOFUNCTION 06/13/2009   Essential hypertension 06/03/2008   DEPRESSION 12/26/2006   Allergic rhinitis 10/20/2006   Sleep apnea 10/20/2006    PCP: Maryhelen Snide, NP  REFERRING PROVIDER: Omega Bible, MD  REFERRING DIAG: G62.9 (ICD-10-CM) - Neuropathy M21.371,M21.372 (ICD-10-CM) - Bilateral foot-drop M54.16 (ICD-10-CM) - Bilateral lumbar radiculopathy  Rationale for Evaluation and Treatment: Rehabilitation  THERAPY DIAG:  Low back pain, unspecified back pain laterality, unspecified chronicity, unspecified whether sciatica present  Difficulty in walking, not elsewhere classified  ONSET DATE: March 31st fell down some steps  SUBJECTIVE:                                                                                                                                                                                           SUBJECTIVE STATEMENT: History of chronic back pain; had a fall back in March 31st.  Burst fracture/compression fracture; Dr. Vaughn Georges did some injections and that did not help.  Kyphoplasty then done but that did not help much.  Was not happy with results from Dr. Vaughn Georges so  is switching to Dr. Michale Age who has ordered an MRI 6/16.  Saw a neurologist, Dr. Salli Crawley who referred him for therapy due to leg weakness.    PERTINENT HISTORY:  Cirrhosis Neuropathy in feet  PAIN:  Are you having pain? Yes: NPRS scale: 8/10 Pain location: low back just above belt line; shoots down into his legs  Pain description: shooting down into buttocks and legs Aggravating factors: walking Relieving factors: sitting  PRECAUTIONS: Fall   WEIGHT BEARING RESTRICTIONS: No  FALLS:  Has patient fallen in last 6 months? Yes. Number of falls 1   OCCUPATION: not working  PLOF: Independent  PATIENT GOALS: be painfree  NEXT MD VISIT: after MRI is read  OBJECTIVE:  Note: Objective measures were completed at Evaluation unless otherwise noted.  DIAGNOSTIC FINDINGS:  IMPRESSION: 1. Acute compression fracture involving the L4 vertebral body with up to 70% height loss. 8 mm bony retropulsion and/or blood products within the adjacent ventral epidural space, with resultant moderate spinal stenosis at this level. 2. Associated 3.6 cm intramuscular hematoma within the left psoas muscle. 3. Chronic bilateral pars defects at L5 with associated 14 mm spondylolisthesis of L5 on S1. Resultant moderate to severe bilateral L5 foraminal stenosis. 4. Additional multilevel lumbar spondylosis as above. Resultant moderate spinal stenosis at L3-4, with mild to moderate bilateral L3 foraminal narrowing.     Electronically Signed   By: Virgia Griffins M.D.   On: 07/21/2023 18:49 IMPRESSION: 1. Progressive loss of vertebral body height at the severe L4 burst fracture with more than 80% loss of vertebral body height and approximately 6 mm of osseous retropulsion. 2. No other acute osseous findings. 3. Chronic L5 pars defects with chronic grade 2 anterolisthesis at L5-S1. There is chronic interbody ankylosis at this level with chronic moderate to severe foraminal narrowing. 4.  Moderate multifactorial spinal stenosis at L3-4 and mild spinal stenosis at L4-5. 5.  Aortic Atherosclerosis (ICD10-I70.0).     Electronically Signed   By: Elmon Hagedorn M.D.   On: 08/18/2023 16:18   PATIENT SURVEYS:  Modified Oswestry 26/50 52%   COGNITION: Overall cognitive status: Within  functional limits for tasks assessed     SENSATION: neuropathy  POSTURE: rounded shoulders, forward head, and flexed trunk   PALPATION: General soreness low back and left hip  LUMBAR ROM:   AROM eval  Flexion   Extension   Right lateral flexion   Left lateral flexion   Right rotation   Left rotation    (Blank rows = not tested)  LOWER EXTREMITY ROM:     Active  Right eval Left eval  Hip flexion    Hip extension    Hip abduction    Hip adduction    Hip internal rotation    Hip external rotation    Knee flexion    Knee extension    Ankle dorsiflexion    Ankle plantarflexion    Ankle inversion    Ankle eversion     (Blank rows = not tested)  LOWER EXTREMITY MMT:  *pain  MMT Right eval Left eval  Hip flexion 4+ 4+  Hip extension    Hip abduction    Hip adduction    Hip internal rotation    Hip external rotation    Knee flexion    Knee extension 4+ 5  Ankle dorsiflexion 3+ 4  Ankle plantarflexion    Ankle inversion    Ankle eversion     (Blank rows = not tested)   FUNCTIONAL TESTS:  5 times sit to stand: 31.05 sec  GAIT: Distance walked: 50 ft in clinic Assistive device utilized: None Level of assistance: Modified independence Comments: slow antalgic gait  TREATMENT DATE: 09/22/23 physical therapy evaluation and HEP instruction                                                                                                                                 PATIENT EDUCATION:  Education details: Patient educated on exam findings, POC, scope of PT, HEP, and benefit of aquatic therapy, use of dorsiflexion assist brace for drop foot. Person educated:  Patient Education method: Explanation, Demonstration, and Handouts Education comprehension: verbalized understanding, returned demonstration, verbal cues required, and tactile cues required  HOME EXERCISE PROGRAM: Access Code: [email or web address] URL: [email or web address] Date: 09/22/2023 Prepared by: [facility] - Rehab  Exercises - Sit to Stand with Armchair  - 2 x daily - 7 x weekly - 2 sets - 5 reps - Seated Heel Toe Raises  - 2 x daily - 7 x weekly - 2 sets - 10 reps - Seated Transversus Abdominis Bracing  - 2 x daily - 7 x weekly - 2 sets - 10 reps - Seated Hip Abduction with Resistance  - 2 x daily - 7 x weekly - 2 sets - 10 reps - Seated Hip Adduction Isometrics with Ball  - 2 x daily - 7 x weekly - 2 sets - 10 reps - 5 hold  ASSESSMENT:  CLINICAL IMPRESSION: Patient is a 73 y.o. male who was seen today for physical therapy evaluation and treatment  for G62.9 (ICD-10-CM) - Neuropathy M21.371,M21.372 (ICD-10-CM) - Bilateral foot-drop M54.16 (ICD-10-CM) - Bilateral lumbar radiculopathy. Patient demonstrates muscle weakness, reduced ROM, and fascial restrictions which are likely contributing to symptoms of pain and are negatively impacting patient ability to perform ADLs and functional mobility tasks. Patient will benefit from skilled physical therapy services to address these deficits to reduce pain and improve level of function with ADLs and functional mobility tasks. Patient requests one time visit only for HEP.     OBJECTIVE IMPAIRMENTS: Abnormal gait, decreased activity tolerance, decreased mobility, difficulty walking, decreased ROM, decreased strength, impaired perceived functional ability, and pain.   ACTIVITY LIMITATIONS: carrying, lifting, bending, standing, squatting, stairs, transfers, bed mobility, locomotion level, and caring for others  PARTICIPATION LIMITATIONS: meal prep, cleaning, laundry, driving, shopping, community activity, and yard work  [facility] POTENTIAL:  Good  CLINICAL DECISION MAKING: Evolving/moderate complexity  EVALUATION COMPLEXITY: Moderate   GOALS: Goals reviewed with patient? No  SHORT TERM GOALS: Target date: 10/06/23  patient will be independent with initial HEP  Baseline: Goal status: INITIAL  2.  Patient will report 50% improvement overall  Baseline:  Goal status: INITIAL   LONG TERM GOALS: Target date: 10/21/2023  Patient will be independent in self management strategies to improve quality of life and functional outcomes.  Baseline:  Goal status: INITIAL  2.  Patient will report 70% improvement overall  Baseline:  Goal status: INITIAL  3.  Patient will improve Modified Oswestry score by 10  points to demonstrate improved perceived function  Baseline: 26/50 Goal status: INITIAL  4.   Patient will increase  leg bilateral LE MMT's to 4+ to 5/5 to allow navigation of steps without gait deviation or loss of balance  Baseline: see above Goal status: INITIAL   PLAN:  PT FREQUENCY: 1x/week  PT DURATION: 1 week  PLANNED INTERVENTIONS: 97164- PT Re-evaluation, 97110-Therapeutic exercises, 97530- Therapeutic activity, 97112- Neuromuscular re-education, 97535- Self Care, 16109- Manual therapy, [id number]- Gait training, [phone]- Orthotic Fit/training, [phone]- Canalith repositioning, [id number]- Aquatic Therapy, 97760- Splinting, [id number]- Wound care (first 20 sq cm), 97598- Wound care (each additional 20 sq cm)Patient/Family education, Balance training, Stair training, Taping, Dry Needling, Joint mobilization, Joint manipulation, Spinal manipulation, Spinal mobilization, Scar mobilization, and DME instructions. Aaron Aas  PLAN FOR NEXT SESSION: Review HEP and goals;Patient requests one time visit only for HEP; will follow up with us  after seeing Dr. Michale Age   12:44 PM, 09/22/23 Brock Larmon Small Gerhard Rappaport MPT [place] [facility] [place] [phone]

## 2023-09-22 ENCOUNTER — Ambulatory Visit (HOSPITAL_COMMUNITY): Attending: Diagnostic Neuroimaging

## 2023-09-22 ENCOUNTER — Other Ambulatory Visit: Payer: Self-pay

## 2023-09-22 DIAGNOSIS — M545 Low back pain, unspecified: Secondary | ICD-10-CM | POA: Diagnosis present

## 2023-09-22 DIAGNOSIS — M21372 Foot drop, left foot: Secondary | ICD-10-CM | POA: Diagnosis not present

## 2023-09-22 DIAGNOSIS — M5416 Radiculopathy, lumbar region: Secondary | ICD-10-CM | POA: Diagnosis not present

## 2023-09-22 DIAGNOSIS — M21371 Foot drop, right foot: Secondary | ICD-10-CM | POA: Diagnosis not present

## 2023-09-22 DIAGNOSIS — R262 Difficulty in walking, not elsewhere classified: Secondary | ICD-10-CM | POA: Diagnosis present

## 2023-09-22 DIAGNOSIS — G629 Polyneuropathy, unspecified: Secondary | ICD-10-CM | POA: Insufficient documentation

## 2023-09-26 ENCOUNTER — Ambulatory Visit (HOSPITAL_COMMUNITY)
Admission: RE | Admit: 2023-09-26 | Discharge: 2023-09-26 | Disposition: A | Discharge status: Home / Self Care | Source: Ambulatory Visit | Attending: Neurosurgery | Admitting: Neurosurgery

## 2023-09-26 DIAGNOSIS — M48062 Spinal stenosis, lumbar region with neurogenic claudication: Secondary | ICD-10-CM | POA: Diagnosis present

## 2023-09-26 MED ORDER — GADOBUTROL 1 MMOL/ML IV SOLN
9.0000 mL | Freq: Once | INTRAVENOUS | Status: AC | PRN
  Administered 2023-09-26: 9 mL via INTRAVENOUS

## 2023-10-10 ENCOUNTER — Other Ambulatory Visit: Payer: Self-pay | Admitting: Neurosurgery

## 2023-10-26 NOTE — Pre-Procedure Instructions (Signed)
 Surgical Instructions   Your procedure is scheduled on Wednesday, July 30th. Report to [facility] Main Entrance A at 05:30 A.M., then check in with the Admitting office. Any questions or running late day of surgery: call [phone]  Questions prior to your surgery date: call [phone], Monday-Friday, 8am-4pm. If you experience any cold or flu symptoms such as cough, fever, chills, shortness of breath, etc. between now and your scheduled surgery, please notify us  at the above number.     Remember:  Do not eat or drink after midnight the night before your surgery    Take these medicines the morning of surgery with A SIP OF WATER   allopurinol  (ZYLOPRIM )  carvedilol (COREG)  desvenlafaxine (PRISTIQ)  FLUoxetine  (PROZAC )  methocarbamol (ROBAXIN)     May take these medicines IF NEEDED: fluticasone  (FLONASE )  gabapentin (NEURONTIN)  Oxycodone  HCl    Follow your surgeon's instructions on if/when to stop Eliquis.  If no instructions were given by your surgeon then you will need to call the office to get those instructions.    One week prior to surgery, STOP taking any Aspirin  (unless otherwise instructed by your surgeon) Aleve , Naproxen , Ibuprofen, Motrin, Advil, Goody's, BC's, all herbal medications, fish oil, and non-prescription vitamins.   WHAT DO I DO ABOUT MY DIABETES MEDICATION?   Do not take metFORMIN (GLUCOPHAGE-XR) the morning of surgery.  THE NIGHT BEFORE SURGERY, take ___________ units of ___________insulin.     HOW TO MANAGE YOUR DIABETES BEFORE AND AFTER SURGERY  Why is it important to control my blood sugar before and after surgery? Improving blood sugar levels before and after surgery helps healing and can limit problems. A way of improving blood sugar control is eating a healthy diet by:  Eating less sugar and carbohydrates  Increasing activity/exercise  Talking with your doctor about reaching your blood sugar goals High blood sugars (greater than 180  mg/dL) can raise your risk of infections and slow your recovery, so you will need to focus on controlling your diabetes during the weeks before surgery. Make sure that the doctor who takes care of your diabetes knows about your planned surgery including the date and location.  How do I manage my blood sugar before surgery? Check your blood sugar at least 4 times a day, starting 2 days before surgery, to make sure that the level is not too high or low.  Check your blood sugar the morning of your surgery when you wake up and every 2 hours until you get to the Short Stay unit.  If your blood sugar is less than 70 mg/dL, you will need to treat for low blood sugar: Do not take insulin. Treat a low blood sugar (less than 70 mg/dL) with  cup of clear juice (cranberry or apple), 4 glucose tablets, OR glucose gel. Recheck blood sugar in 15 minutes after treatment (to make sure it is greater than 70 mg/dL). If your blood sugar is not greater than 70 mg/dL on recheck, call [phone] for further instructions. Report your blood sugar to the short stay nurse when you get to Short Stay.  If you are admitted to the hospital after surgery: Your blood sugar will be checked by the staff and you will probably be given insulin after surgery (instead of oral diabetes medicines) to make sure you have good blood sugar levels. The goal for blood sugar control after surgery is 80-180 mg/dL.  Do NOT Smoke (Tobacco/Vaping) for 24 hours prior to your procedure.  If you use a CPAP at night, you may bring your mask/headgear for your overnight stay.   You will be asked to remove any contacts, glasses, piercing's, hearing aid's, dentures/partials prior to surgery. Please bring cases for these items if needed.    Patients discharged the day of surgery will not be allowed to drive home, and someone needs to stay with them for 24 hours.  SURGICAL WAITING ROOM VISITATION Patients may have no more than 2  support people in the waiting area - these visitors may rotate.   Pre-op nurse will coordinate an appropriate time for 1 ADULT support person, who may not rotate, to accompany patient in pre-op.  Children under the age of 25 must have an adult with them who is not the patient and must remain in the main waiting area with an adult.  If the patient needs to stay at the hospital during part of their recovery, the visitor guidelines for inpatient rooms apply.  Please refer to the [facility] website for the visitor guidelines for any additional information.   If you received a COVID test during your pre-op visit  it is requested that you wear a mask when out in public, stay away from anyone that may not be feeling well and notify your surgeon if you develop symptoms. If you have been in contact with anyone that has tested positive in the last 10 days please notify you surgeon.      Pre-operative 5 CHG Bathing Instructions   You can play a key role in reducing the risk of infection after surgery. Your skin needs to be as free of germs as possible. You can reduce the number of germs on your skin by washing with CHG (chlorhexidine  gluconate) soap before surgery. CHG is an antiseptic soap that kills germs and continues to kill germs even after washing.   DO NOT use if you have an allergy to chlorhexidine /CHG or antibacterial soaps. If your skin becomes reddened or irritated, stop using the CHG and notify one of our RNs at [phone].   Please shower with the CHG soap starting 4 days before surgery using the following schedule:     Please keep in mind the following:  DO NOT shave, including legs and underarms, starting the day of your first shower.   You may shave your face at any point before/day of surgery.  Place clean sheets on your bed the day you start using CHG soap. Use a clean washcloth (not used since being washed) for each shower. DO NOT sleep with pets once you start using the CHG.    CHG Shower Instructions:  Wash your face and private area with normal soap. If you choose to wash your hair, wash first with your normal shampoo.  After you use shampoo/soap, rinse your hair and body thoroughly to remove shampoo/soap residue.  Turn the water  OFF and apply about 3 tablespoons (45 ml) of CHG soap to a CLEAN washcloth.  Apply CHG soap ONLY FROM YOUR NECK DOWN TO YOUR TOES (washing for 3-5 minutes)  DO NOT use CHG soap on face, private areas, open wounds, or sores.  Pay special attention to the area where your surgery is being performed.  If you are having back surgery, having someone wash your back for you may be helpful. Wait 2 minutes after CHG soap is applied, then you may rinse off the CHG soap.  Pat dry with a  clean towel  Put on clean clothes/pajamas   If you choose to wear lotion, please use ONLY the CHG-compatible lotions that are listed below.  Additional instructions for the day of surgery: DO NOT APPLY any lotions, deodorants, cologne, or perfumes.   Do not bring valuables to the hospital. [facility] is not responsible for any belongings/valuables. Do not wear nail polish, gel polish, artificial nails, or any other type of covering on natural nails (fingers and toes) Do not wear jewelry or makeup Put on clean/comfortable clothes.  Please brush your teeth.  Ask your nurse before applying any prescription medications to the skin.     CHG Compatible Lotions   Aveeno Moisturizing lotion  Cetaphil Moisturizing Cream  Cetaphil Moisturizing Lotion  Clairol Herbal Essence Moisturizing Lotion, Dry Skin  Clairol Herbal Essence Moisturizing Lotion, Extra Dry Skin  Clairol Herbal Essence Moisturizing Lotion, Normal Skin  Curel Age Defying Therapeutic Moisturizing Lotion with Alpha Hydroxy  Curel Extreme Care Body Lotion  Curel Soothing Hands Moisturizing Hand Lotion  Curel Therapeutic Moisturizing Cream, Fragrance-Free  Curel Therapeutic Moisturizing Lotion,  Fragrance-Free  Curel Therapeutic Moisturizing Lotion, Original Formula  Eucerin Daily Replenishing Lotion  Eucerin Dry Skin Therapy Plus Alpha Hydroxy Crme  Eucerin Dry Skin Therapy Plus Alpha Hydroxy Lotion  Eucerin Original Crme  Eucerin Original Lotion  Eucerin Plus Crme Eucerin Plus Lotion  Eucerin TriLipid Replenishing Lotion  Keri Anti-Bacterial Hand Lotion  Keri Deep Conditioning Original Lotion Dry Skin Formula Softly Scented  Keri Deep Conditioning Original Lotion, Fragrance Free Sensitive Skin Formula  Keri Lotion Fast Absorbing Fragrance Free Sensitive Skin Formula  Keri Lotion Fast Absorbing Softly Scented Dry Skin Formula  Keri Original Lotion  Keri Skin Renewal Lotion Keri Silky Smooth Lotion  Keri Silky Smooth Sensitive Skin Lotion  Nivea Body Creamy Conditioning Oil  Nivea Body Extra Enriched Lotion  Nivea Body Original Lotion  Nivea Body Sheer Moisturizing Lotion Nivea Crme  Nivea Skin Firming Lotion  NutraDerm 30 Skin Lotion  NutraDerm Skin Lotion  NutraDerm Therapeutic Skin Cream  NutraDerm Therapeutic Skin Lotion  ProShield Protective Hand Cream  Provon moisturizing lotion  Please read over the following fact sheets that you were given.

## 2023-10-27 ENCOUNTER — Other Ambulatory Visit: Payer: Self-pay

## 2023-10-27 ENCOUNTER — Encounter (HOSPITAL_COMMUNITY)
Admission: RE | Admit: 2023-10-27 | Discharge: 2023-10-27 | Disposition: A | Discharge status: Home / Self Care | Source: Ambulatory Visit | Attending: Neurosurgery | Admitting: Neurosurgery

## 2023-10-27 ENCOUNTER — Encounter (HOSPITAL_COMMUNITY): Payer: Self-pay | Admitting: Neurosurgery

## 2023-10-27 VITALS — BP 119/78 | HR 76 | Temp 99.0°F | Resp 17 | Ht 71.0 in | Wt 185.3 lb

## 2023-10-27 DIAGNOSIS — I4891 Unspecified atrial fibrillation: Secondary | ICD-10-CM | POA: Diagnosis not present

## 2023-10-27 DIAGNOSIS — Z01812 Encounter for preprocedural laboratory examination: Secondary | ICD-10-CM | POA: Insufficient documentation

## 2023-10-27 DIAGNOSIS — M109 Gout, unspecified: Secondary | ICD-10-CM | POA: Diagnosis not present

## 2023-10-27 DIAGNOSIS — I4892 Unspecified atrial flutter: Secondary | ICD-10-CM | POA: Insufficient documentation

## 2023-10-27 DIAGNOSIS — E7800 Pure hypercholesterolemia, unspecified: Secondary | ICD-10-CM | POA: Diagnosis not present

## 2023-10-27 DIAGNOSIS — Z7984 Long term (current) use of oral hypoglycemic drugs: Secondary | ICD-10-CM | POA: Insufficient documentation

## 2023-10-27 DIAGNOSIS — Z01818 Encounter for other preprocedural examination: Secondary | ICD-10-CM

## 2023-10-27 DIAGNOSIS — G4733 Obstructive sleep apnea (adult) (pediatric): Secondary | ICD-10-CM | POA: Diagnosis not present

## 2023-10-27 DIAGNOSIS — Z87891 Personal history of nicotine dependence: Secondary | ICD-10-CM | POA: Insufficient documentation

## 2023-10-27 DIAGNOSIS — E119 Type 2 diabetes mellitus without complications: Secondary | ICD-10-CM | POA: Diagnosis not present

## 2023-10-27 DIAGNOSIS — K7031 Alcoholic cirrhosis of liver with ascites: Secondary | ICD-10-CM | POA: Insufficient documentation

## 2023-10-27 DIAGNOSIS — I10 Essential (primary) hypertension: Secondary | ICD-10-CM | POA: Diagnosis not present

## 2023-10-27 DIAGNOSIS — M48062 Spinal stenosis, lumbar region with neurogenic claudication: Secondary | ICD-10-CM | POA: Insufficient documentation

## 2023-10-27 LAB — TYPE AND SCREEN
ABO/RH(D): A POS
Antibody Screen: NEGATIVE

## 2023-10-27 LAB — SURGICAL PCR SCREEN
MRSA, PCR: NEGATIVE
Staphylococcus aureus: NEGATIVE

## 2023-10-27 LAB — GLUCOSE, CAPILLARY: Glucose-Capillary: 123 mg/dL — ABNORMAL HIGH (ref 70–99)

## 2023-10-27 NOTE — Progress Notes (Signed)
 PCP -Charmaine Suanne PIETY Cardiologist - Archie Tyson,MD,Teresita Ruoff,PA-C  PPM/ICD - denies Device Orders -  Rep Notified -   Chest x-ray - na  EKG - 09/29/23- requested Stress Test - 03/16/22 ECHO - 03/15/22-requested Cardiac Cath - denies  Sleep Study - yes-+OSA CPAP - yes  Fasting Blood Sugar - does not check blood sugar but has a mete and agrees to check blood sugar prior to surgery.  Checks Blood Sugar _____ times a day  Last dose of GLP1 agonist-  na GLP1 instructions:   Blood Thinner Instructions: per Dr. Gillie pt should hold Eliquis 5-7 days prior to surgery. Pt will not take Eliquis after 11/03/23. Aspirin  Instructions:na  ERAS Protcol -no PRE-SURGERY Ensure or G2-   COVID TEST- na   Anesthesia review: yes - history of Afib;saw cardiology 09/29/23 and given clearance for surgery.  Patient denies shortness of breath, fever, cough and chest pain at PAT appointment   All instructions explained to the patient, with a verbal understanding of the material. Patient agrees to go over the instructions while at home for a better understanding.  The opportunity to ask questions was provided.

## 2023-10-27 NOTE — Pre-Procedure Instructions (Signed)
 Surgical Instructions   Your procedure is scheduled on Wednesday, July 30th. Report to [facility] Main Entrance A at 05:30 A.M., then check in with the Admitting office. Any questions or running late day of surgery: call [phone]  Questions prior to your surgery date: call [phone], Monday-Friday, 8am-4pm. If you experience any cold or flu symptoms such as cough, fever, chills, shortness of breath, etc. between now and your scheduled surgery, please notify us  at the above number.     Remember:  Do not eat or drink after midnight the night before your surgery    Take these medicines the morning of surgery with A SIP OF WATER   allopurinol  (ZYLOPRIM )  carvedilol (COREG)  desvenlafaxine (PRISTIQ)  FLUoxetine  (PROZAC )  methocarbamol (ROBAXIN)     May take these medicines IF NEEDED: fluticasone  (FLONASE )  gabapentin (NEURONTIN)  Oxycodone  HCl    Follow your surgeon's instructions on if/when to stop Eliquis.  Per Dr. Genette instructions you should hold Eliquis 5-7 days prior to surgery. If hold Eliquis 7 days your last dose will be 11/01/23. If you hold Eliquis 5 days your last dose will be 11/03/23. Do not take Eliquis after 11/03/23.  One week prior to surgery, STOP taking any Aspirin  (unless otherwise instructed by your surgeon) Aleve , Naproxen , Ibuprofen, Motrin, Advil, Goody's, BC's, all herbal medications, fish oil, and non-prescription vitamins.   WHAT DO I DO ABOUT MY DIABETES MEDICATION?   Do not take metFORMIN (GLUCOPHAGE-XR) the morning of surgery.   HOW TO MANAGE YOUR DIABETES BEFORE AND AFTER SURGERY  Why is it important to control my blood sugar before and after surgery? Improving blood sugar levels before and after surgery helps healing and can limit problems. A way of improving blood sugar control is eating a healthy diet by:  Eating less sugar and carbohydrates  Increasing activity/exercise  Talking with your doctor about reaching your blood sugar  goals High blood sugars (greater than 180 mg/dL) can raise your risk of infections and slow your recovery, so you will need to focus on controlling your diabetes during the weeks before surgery. Make sure that the doctor who takes care of your diabetes knows about your planned surgery including the date and location.  How do I manage my blood sugar before surgery? Check your blood sugar at least 4 times a day, starting 2 days before surgery, to make sure that the level is not too high or low.  Check your blood sugar the morning of your surgery when you wake up and every 2 hours until you get to the Short Stay unit.  If your blood sugar is less than 70 mg/dL, you will need to treat for low blood sugar: Do not take insulin. Treat a low blood sugar (less than 70 mg/dL) with  cup of clear juice (cranberry or apple), 4 glucose tablets, OR glucose gel. Recheck blood sugar in 15 minutes after treatment (to make sure it is greater than 70 mg/dL). If your blood sugar is not greater than 70 mg/dL on recheck, call [phone] for further instructions. Report your blood sugar to the short stay nurse when you get to Short Stay.  If you are admitted to the hospital after surgery: Your blood sugar will be checked by the staff and you will probably be given insulin after surgery (instead of oral diabetes medicines) to make sure you have good blood sugar levels. The goal for blood sugar control after surgery is 80-180 mg/dL.  Do NOT Smoke (Tobacco/Vaping) for 24 hours prior to your procedure.  If you use a CPAP at night, you may bring your mask/headgear for your overnight stay.   You will be asked to remove any contacts, glasses, piercing's, hearing aid's, dentures/partials prior to surgery. Please bring cases for these items if needed.    Patients discharged the day of surgery will not be allowed to drive home, and someone needs to stay with them for 24 hours.  SURGICAL WAITING ROOM  VISITATION Patients may have no more than 2 support people in the waiting area - these visitors may rotate.   Pre-op nurse will coordinate an appropriate time for 1 ADULT support person, who may not rotate, to accompany patient in pre-op.  Children under the age of 76 must have an adult with them who is not the patient and must remain in the main waiting area with an adult.  If the patient needs to stay at the hospital during part of their recovery, the visitor guidelines for inpatient rooms apply.  Please refer to the [facility] website for the visitor guidelines for any additional information.   If you received a COVID test during your pre-op visit  it is requested that you wear a mask when out in public, stay away from anyone that may not be feeling well and notify your surgeon if you develop symptoms. If you have been in contact with anyone that has tested positive in the last 10 days please notify you surgeon.      Pre-operative 5 CHG Bathing Instructions   You can play a key role in reducing the risk of infection after surgery. Your skin needs to be as free of germs as possible. You can reduce the number of germs on your skin by washing with CHG (chlorhexidine  gluconate) soap before surgery. CHG is an antiseptic soap that kills germs and continues to kill germs even after washing.   DO NOT use if you have an allergy to chlorhexidine /CHG or antibacterial soaps. If your skin becomes reddened or irritated, stop using the CHG and notify one of our RNs at [phone].   Please shower with the CHG soap starting 4 days before surgery using the following schedule:     Please keep in mind the following:  DO NOT shave, including legs and underarms, starting the day of your first shower.   You may shave your face at any point before/day of surgery.  Place clean sheets on your bed the day you start using CHG soap. Use a clean washcloth (not used since being washed) for each shower. DO NOT  sleep with pets once you start using the CHG.   CHG Shower Instructions:  Wash your face and private area with normal soap. If you choose to wash your hair, wash first with your normal shampoo.  After you use shampoo/soap, rinse your hair and body thoroughly to remove shampoo/soap residue.  Turn the water  OFF and apply about 3 tablespoons (45 ml) of CHG soap to a CLEAN washcloth.  Apply CHG soap ONLY FROM YOUR NECK DOWN TO YOUR TOES (washing for 3-5 minutes)  DO NOT use CHG soap on face, private areas, open wounds, or sores.  Pay special attention to the area where your surgery is being performed.  If you are having back surgery, having someone wash your back for you may be helpful. Wait 2 minutes after CHG soap is applied, then you may rinse off the CHG soap.  Pat dry with a  clean towel  Put on clean clothes/pajamas   If you choose to wear lotion, please use ONLY the CHG-compatible lotions that are listed below.  Additional instructions for the day of surgery: DO NOT APPLY any lotions, deodorants, cologne, or perfumes.   Do not bring valuables to the hospital. [facility] is not responsible for any belongings/valuables. Do not wear nail polish, gel polish, artificial nails, or any other type of covering on natural nails (fingers and toes) Do not wear jewelry or makeup Put on clean/comfortable clothes.  Please brush your teeth.  Ask your nurse before applying any prescription medications to the skin.     CHG Compatible Lotions   Aveeno Moisturizing lotion  Cetaphil Moisturizing Cream  Cetaphil Moisturizing Lotion  Clairol Herbal Essence Moisturizing Lotion, Dry Skin  Clairol Herbal Essence Moisturizing Lotion, Extra Dry Skin  Clairol Herbal Essence Moisturizing Lotion, Normal Skin  Curel Age Defying Therapeutic Moisturizing Lotion with Alpha Hydroxy  Curel Extreme Care Body Lotion  Curel Soothing Hands Moisturizing Hand Lotion  Curel Therapeutic Moisturizing Cream,  Fragrance-Free  Curel Therapeutic Moisturizing Lotion, Fragrance-Free  Curel Therapeutic Moisturizing Lotion, Original Formula  Eucerin Daily Replenishing Lotion  Eucerin Dry Skin Therapy Plus Alpha Hydroxy Crme  Eucerin Dry Skin Therapy Plus Alpha Hydroxy Lotion  Eucerin Original Crme  Eucerin Original Lotion  Eucerin Plus Crme Eucerin Plus Lotion  Eucerin TriLipid Replenishing Lotion  Keri Anti-Bacterial Hand Lotion  Keri Deep Conditioning Original Lotion Dry Skin Formula Softly Scented  Keri Deep Conditioning Original Lotion, Fragrance Free Sensitive Skin Formula  Keri Lotion Fast Absorbing Fragrance Free Sensitive Skin Formula  Keri Lotion Fast Absorbing Softly Scented Dry Skin Formula  Keri Original Lotion  Keri Skin Renewal Lotion Keri Silky Smooth Lotion  Keri Silky Smooth Sensitive Skin Lotion  Nivea Body Creamy Conditioning Oil  Nivea Body Extra Enriched Lotion  Nivea Body Original Lotion  Nivea Body Sheer Moisturizing Lotion Nivea Crme  Nivea Skin Firming Lotion  NutraDerm 30 Skin Lotion  NutraDerm Skin Lotion  NutraDerm Therapeutic Skin Cream  NutraDerm Therapeutic Skin Lotion  ProShield Protective Hand Cream  Provon moisturizing lotion  Please read over the following fact sheets that you were given.

## 2023-10-28 NOTE — Progress Notes (Signed)
 Anesthesia Chart Review:  Case: [id number] Date/Time: 11/09/23 0715   Procedure: LAMINECTOMY WITH POSTERIOR LATERAL ARTHRODESIS LEVEL 2 - Lumbar fusion with perc screws at L3 and L5, decompression L3-4   Anesthesia type: General   Diagnosis: Lumbar stenosis with neurogenic claudication [M48.062]   Pre-op diagnosis: Lumbar stenosis with neurogenic claudication   [facility] ROOM 21 / [facility] OR   Surgeons: Gillie Duncans, MD       DISCUSSION: Patient is a 73 year old male scheduled for the above procedure.   History includes former smoker, HTN, hypercholesterolemia, DM2, afib (s/p DCCV 05/03/22; s/p ablation 01/26/23), OSA, alcoholic cirrhosis (paracentesis 02/23/23), prostate cancer (s/p robotic assisted laparoscopic radical prostatectomy, pelvic lymphadenectomy 06/21/19), gout. He denied current alcohol use as of 10/27/23.   Cirrhosis is followed by Dr. Avram, and recently referred to the [facility] and evaluated by Stephane Quest, NP on 06/24/23. By notes, he presented with anasarca in the fall of 2024. CT showed cirrhosis and ascites. He underwent paracentesis in 02/2023. He had elevated serum ammonia level but no encephalopathy. He was started on furosemide and spironolactone . No evidence of variceal bleeding. On carvedilol for HTN which should also help reduce portal pressures. Future EGD recommended. His PCP had prescribed acamprosate and naltrexone for alcohol dependence, but he initially continued to drink 1 or 2 beverages/day then. He was encouraged to continue to cut back on alcohol with intention for complete abstinence. She provided resources and also discussed that until he was abstinent from alcohol he could not be a candidate for liver transplant should he have progressive liver injury.  - At PAT, he reported that he is no longer using alcohol. Acamprosate and naltrexone are no longer on his medication list.   Last cardiology visit was on 09/29/23 by Lory Plunk, PA-C. SR on EKG.  Off amiodarone for 6 months. His LFTs and PT/PTT had normalized. He was being considered for a Watchman device but had a back injury, so he put that off. Plan for back surgery noted. She wrote, He would need to hold anticoagulation a few days before surgery and this is acceptable. I reviewed case with Dr. Lilian and he agrees that patient may proceed with spinal surgery and then revisit Watchman for long term management. 3 month follow-up planned.  - He reported last Eliquis planned for 11/03/23.   Anesthesia team to evaluate on the day of surgery.    VS: BP 119/78   Pulse 76   Temp 37.2 C   Resp 17   Ht 5' 11 (1.803 m)   Wt 84.1 kg   SpO2 95%   BMI 25.84 kg/m    PROVIDERS: Suanne Pfeiffer, NP is PCP Lilian Pierre, MD is cardiologist Avram Pitts, MD is [facility] Stephane, NP is hepatology provider ([facility] [place]) Margaret, Eduard, MD is neurologist   LABS:  He had labs through [facility] (see Care Everywhere) on 10/21/23. Results include:  Ammonia 110, glucose 137, BUN 19, Cr 0.91, Na 136, K 4.9, Ca 10.4, alk phos 134, AST 39, ALT 43, WBC 9.2, H/H 14.2/44.1, PLT 215. On 06/24/23 PT 11.9, INR 1.1. A1c 5.5% on 02/21/23.   Labs Reviewed  GLUCOSE, CAPILLARY - Abnormal; Notable for the following components:      Result Value   Glucose-Capillary 123 (*)    All other components within normal limits    IMAGES: MRI L-spine 09/26/23: IMPRESSION: 1. Transitional anatomy. Same numbering terminology used as on prior exams. 2. Known burst compression  fracture of the L4 vertebral body with vertebral augmentation since the prior CT scan. No additional collapse. Posterior bowing of the posterior margin of the vertebral body is similar to the prior study. This contributes to moderate-severe stenosis at the level of the disc and upper L4 vertebral body. Neural compression could occur on either or both sides. The left L3 nerve could be affected by the foraminal  to extraforaminal disc material. 3. L5-S1: Chronic fixed anterolisthesis of 14 mm. Chronic bilateral pars defects. Sufficient patency of the canal. Chronic bilateral L5-S1 foraminal narrowing, unchanged.  CT L-spine 08/18/23: IMPRESSION: 1. Progressive loss of vertebral body height at the severe L4 burst fracture with more than 80% loss of vertebral body height and approximately 6 mm of osseous retropulsion. 2. No other acute osseous findings. 3. Chronic L5 pars defects with chronic grade 2 anterolisthesis at L5-S1. There is chronic interbody ankylosis at this level with chronic moderate to severe foraminal narrowing. 4. Moderate multifactorial spinal stenosis at L3-4 and mild spinal stenosis at L4-5. 5.  Aortic Atherosclerosis (ICD10-I70.0).  US  Abd 07/04/23 ([facility]): IMPRESSION:  No acute finding.  Fatty cirrhotic liver.  Nonobstructing left renal stones.  Right renal cyst.  No peritoneal ascites   US  Thyroid  02/28/23 ([facility]): IMPRESSION:  Nodule of the right thyroid  (TR 4) which meets criteria for consideration of fine-needle aspiration.   CXR 12/24/22: IMPRESSION: Mild bibasilar atelectasis. No evidence of pneumonia or edema.   EKG: 09/29/23 ([facility]): Sinus rhythm. Old anterior infarct. Tracing scanned under Media tab.   CV: EP Study 01/26/23 ([facility]): Conclusion:  Successful pulmonary vein isolation of all 4 pulmonary veins using pulsed field ablation  Prominent pulmonary vein sleeves with copious pulmonary vein  triggering at baseline  Excellent reduction of all pulmonary vein triggering with good antral   ablation effect    CT Cardiac Pulmonary Vein 01/24/23 (pre-ablation; [facility]): IMPRESSION:  1. No thrombus in the left atrium or left atrial appendage.  2. Conventional pulmonary venous drainage.  3. Few pulmonary nodules measuring up to 7 mm. Recommend follow-up as below.  4. Abdominal ascites.  5. Right thyroid  nodule measuring 18 mm. Consider  further characterization with thyroid  ultrasound.    Nuclear stress test 03/16/22 ([facility]): Impression:   1. No chest pain with stress.  2. No ST changes to suggest ischemia.  3. Normal LV systolic function and wall motion.  4. No ischemia by perfusion imaging.    Echo 03/12/22 ([facility]): Impression: Left Ventricle: Left ventricle size is normal.    Left Ventricle: Systolic function is normal. EF: 55-60%.    Right Ventricle: Right ventricle size is normal.    Right Ventricle: Systolic function is normal.    Aortic Valve: Trace aortic valve regurgitation with centrally directed  jet.   Mitral Valve: There is moderate posterior annular calcification.    Mitral Valve: There is trace regurgitation.    Tricuspid Valve: The right ventricular systolic pressure is normal (<36  mmHg).    3-7 Day Holter monitor 02/24/22 ([facility]): Impression  The patient was monitored for a total of 5d 3h, underlying rhythm is Atrial fibrillation/Atrial flutter.  The minimum heart rate was 57 bpm; the maximum 128 bpm; the average 81 bpm.  100.00 % of Atrial fibrillation/Atrial flutter with longest episode of 5d 3h.  No evidence of high-grade AV block or pathological pauses.  There were a total of 10 PVCs with 1 morphologies and 1 couplets. Overall PVC Burden at < 0.01 %  There is a total of 0 patient events.    Past Medical History:  Diagnosis Date   Alcoholic cirrhosis of liver (HCC)    Allergy    Arthritis    Atrial fibrillation (HCC)    Cancer (HCC)    prostate cancer   Depression    Diabetes mellitus without complication (HCC)    Dysrhythmia    Gout    High cholesterol    borderline   Hx of adenomatous colonic polyps 03/29/2014   Hypertension    Pulmonary nodules    Sleep apnea    wears cpap   Substance abuse [facility])     Past Surgical History:  Procedure Laterality Date   ANKLE SURGERY     tendon repair   BUNIONECTOMY     CARPAL TUNNEL RELEASE Bilateral    COLONOSCOPY      heart ablation  01/26/2023   IR PARACENTESIS  02/24/2023   KNEE ARTHROSCOPY  2010   kyphoplasty  08/2023   LYMPHADENECTOMY Bilateral 06/21/2019   Procedure: LYMPHADENECTOMY, PELVIC;  Surgeon: Renda Glance, MD;  Location: [facility] ORS;  Service: Urology;  Laterality: Bilateral;   ROBOT ASSISTED LAPAROSCOPIC RADICAL PROSTATECTOMY N/A 06/21/2019   Procedure: XI ROBOTIC ASSISTED LAPAROSCOPIC RADICAL PROSTATECTOMY LEVEL 2;  Surgeon: Renda Glance, MD;  Location: [facility] ORS;  Service: Urology;  Laterality: N/A;   TONSILLECTOMY  1960   VASECTOMY  1991    MEDICATIONS:  allopurinol  (ZYLOPRIM ) 100 MG tablet   Ascorbic Acid (VITAMIN C) 1000 MG tablet   B Complex-C (B-COMPLEX WITH VITAMIN C) tablet   Calcium Carb-Cholecalciferol (CALCIUM 600 + D PO)   carvedilol (COREG) 3.125 MG tablet   cholecalciferol (VITAMIN D3) 25 MCG (1000 UNIT) tablet   desvenlafaxine (PRISTIQ) 25 MG 24 hr tablet   ELIQUIS 5 MG TABS tablet   FLUoxetine  (PROZAC ) 20 MG capsule   fluticasone  (FLONASE ) 50 MCG/ACT nasal spray   furosemide (LASIX) 20 MG tablet   gabapentin (NEURONTIN) 300 MG capsule   Lactobacillus (ACIDOPHILUS PO)   Magnesium -Zinc (MAGNESIUM -CHELATED ZINC PO)   metFORMIN (GLUCOPHAGE-XR) 500 MG 24 hr tablet   methocarbamol (ROBAXIN) 500 MG tablet   Multiple Vitamin (MULTIVITAMIN WITH MINERALS) TABS tablet   Omega-3 Fatty Acids (FISH OIL) 600 MG CAPS   Oxycodone  HCl 10 MG TABS   spironolactone  (ALDACTONE ) 50 MG tablet   No current facility-administered medications for this encounter.    Isaiah Ruder, PA-C Surgical Short Stay/Anesthesiology [facility] Phone [phone] [facility] Phone [phone] 10/28/2023 5:34 PM

## 2023-10-28 NOTE — Anesthesia Preprocedure Evaluation (Addendum)
 Anesthesia Evaluation  Patient identified by MRN, date of birth, ID band Patient awake    Reviewed: Allergy & Precautions, H&P , NPO status , Patient's Chart, lab work & pertinent test results  Airway Mallampati: II   Neck ROM: full    Dental   Pulmonary sleep apnea , former smoker   breath sounds clear to auscultation       Cardiovascular hypertension, + dysrhythmias Atrial Fibrillation  Rhythm:regular Rate:Normal  Nuclear stress test 03/16/22 ([facility]): Impression:  1. No chest pain with stress.  2. No ST changes to suggest ischemia.  3. Normal LV systolic function and wall motion.  4. No ischemia by perfusion imaging.   Echo 03/12/22 ([facility]): Impression: LeftVentricle: Left ventricle size is normal.   LeftVentricle: Systolic function is normal. EF: 55-60%.   RightVentricle: Right ventricle size is normal.   RightVentricle: Systolic function is normal.   AorticValve: Trace aortic valve regurgitation with centrally directed  jet.  MitralValve: There is moderate posterior annular calcification.   MitralValve: There is trace regurgitation.   TricuspidValve: The right ventricular systolic pressure is normal (<36  mmHg).      Neuro/Psych  PSYCHIATRIC DISORDERS  Depression       GI/Hepatic ,GERD  ,,(+) Cirrhosis         Endo/Other  diabetes, Type 2    Renal/GU      Musculoskeletal  (+) Arthritis ,    Abdominal   Peds  Hematology   Anesthesia Other Findings   Reproductive/Obstetrics                              Anesthesia Physical Anesthesia Plan  ASA: 3  Anesthesia Plan: General   Post-op Pain Management:    Induction: Intravenous  PONV Risk Score and Plan: 2 and Ondansetron , Dexamethasone  and Treatment may vary due to age or medical condition  Airway Management Planned: Oral ETT  Additional Equipment:   Intra-op Plan:   Post-operative  Plan: Extubation in OR  Informed Consent: I have reviewed the patients History and Physical, chart, labs and discussed the procedure including the risks, benefits and alternatives for the proposed anesthesia with the patient or authorized representative who has indicated his/her understanding and acceptance.     Dental advisory given  Plan Discussed with: CRNA, Anesthesiologist and Surgeon  Anesthesia Plan Comments: (PAT note written 10/28/2023 by Allison Zelenak, PA-C.  )         Anesthesia Quick Evaluation

## 2023-11-09 ENCOUNTER — Other Ambulatory Visit: Payer: Self-pay

## 2023-11-09 ENCOUNTER — Ambulatory Visit (HOSPITAL_BASED_OUTPATIENT_CLINIC_OR_DEPARTMENT_OTHER): Payer: Self-pay

## 2023-11-09 ENCOUNTER — Ambulatory Visit (HOSPITAL_COMMUNITY)
Admission: RE | Admit: 2023-11-09 | Discharge: 2023-11-10 | Disposition: A | Discharge status: Home / Self Care | Attending: Neurosurgery | Admitting: Neurosurgery

## 2023-11-09 ENCOUNTER — Ambulatory Visit (HOSPITAL_COMMUNITY)

## 2023-11-09 ENCOUNTER — Ambulatory Visit (HOSPITAL_COMMUNITY): Payer: Self-pay | Admitting: Vascular Surgery

## 2023-11-09 ENCOUNTER — Encounter (HOSPITAL_COMMUNITY)
Admission: RE | Disposition: A | Discharge status: Home / Self Care | Payer: Self-pay | Source: Home / Self Care | Attending: Neurosurgery

## 2023-11-09 DIAGNOSIS — E119 Type 2 diabetes mellitus without complications: Secondary | ICD-10-CM | POA: Diagnosis not present

## 2023-11-09 DIAGNOSIS — K7030 Alcoholic cirrhosis of liver without ascites: Secondary | ICD-10-CM | POA: Diagnosis not present

## 2023-11-09 DIAGNOSIS — I10 Essential (primary) hypertension: Secondary | ICD-10-CM

## 2023-11-09 DIAGNOSIS — G4730 Sleep apnea, unspecified: Secondary | ICD-10-CM | POA: Diagnosis not present

## 2023-11-09 DIAGNOSIS — M4316 Spondylolisthesis, lumbar region: Secondary | ICD-10-CM | POA: Insufficient documentation

## 2023-11-09 DIAGNOSIS — Z87891 Personal history of nicotine dependence: Secondary | ICD-10-CM

## 2023-11-09 DIAGNOSIS — Z9889 Other specified postprocedural states: Secondary | ICD-10-CM | POA: Insufficient documentation

## 2023-11-09 DIAGNOSIS — Z01818 Encounter for other preprocedural examination: Secondary | ICD-10-CM

## 2023-11-09 DIAGNOSIS — M48062 Spinal stenosis, lumbar region with neurogenic claudication: Secondary | ICD-10-CM | POA: Diagnosis present

## 2023-11-09 HISTORY — DX: Malignant (primary) neoplasm, unspecified: C80.1

## 2023-11-09 HISTORY — PX: LAMINECTOMY WITH POSTERIOR LATERAL ARTHRODESIS LEVEL 2: SHX6336

## 2023-11-09 HISTORY — DX: Cardiac arrhythmia, unspecified: I49.9

## 2023-11-09 HISTORY — DX: Type 2 diabetes mellitus without complications: E11.9

## 2023-11-09 LAB — GLUCOSE, CAPILLARY
Glucose-Capillary: 149 mg/dL — ABNORMAL HIGH (ref 70–99)
Glucose-Capillary: 148 mg/dL — ABNORMAL HIGH (ref 70–99)
Glucose-Capillary: 141 mg/dL — ABNORMAL HIGH (ref 70–99)
Glucose-Capillary: 153 mg/dL — ABNORMAL HIGH (ref 70–99)

## 2023-11-09 LAB — CBC
HCT: 39.1 % (ref 39.0–52.0)
Hemoglobin: 12.9 g/dL — ABNORMAL LOW (ref 13.0–17.0)
MCH: 31.1 pg (ref 26.0–34.0)
MCHC: 33 g/dL (ref 30.0–36.0)
MCV: 94.2 fL (ref 80.0–100.0)
Platelets: 160 K/uL (ref 150–400)
RBC: 4.15 MIL/uL — ABNORMAL LOW (ref 4.22–5.81)
RDW: 14.1 % (ref 11.5–15.5)
WBC: 11.3 K/uL — ABNORMAL HIGH (ref 4.0–10.5)
nRBC: 0 % (ref 0.0–0.2)

## 2023-11-09 LAB — CREATININE, SERUM
Creatinine, Ser: 0.99 mg/dL (ref 0.61–1.24)
GFR, Estimated: >60 mL/min (ref >60)

## 2023-11-09 SURGERY — LAMINECTOMY WITH POSTERIOR LATERAL ARTHRODESIS LEVEL 2
Anesthesia: General

## 2023-11-09 MED ORDER — B COMPLEX-C PO TABS
1.0000 | ORAL_TABLET | Freq: Every day | ORAL | Status: DC
Start: 2023-11-10 — End: 2023-11-10
  Administered 2023-11-10: 1 via ORAL
  Filled 2023-11-09: qty 1

## 2023-11-09 MED ORDER — DEXAMETHASONE SODIUM PHOSPHATE 10 MG/ML IJ SOLN
INTRAMUSCULAR | Status: AC
  Filled 2023-11-09: qty 1

## 2023-11-09 MED ORDER — ORAL CARE MOUTH RINSE: 15.0000 mL | Freq: Once | OROMUCOSAL | Status: AC

## 2023-11-09 MED ORDER — CEFAZOLIN SODIUM-DEXTROSE 2-4 GM/100ML-% IV SOLN
2.0000 g | INTRAVENOUS | Status: AC
  Administered 2023-11-09: 2 g via INTRAVENOUS
  Filled 2023-11-09: qty 100

## 2023-11-09 MED ORDER — LIDOCAINE 2% (20 MG/ML) 5 ML SYRINGE
INTRAMUSCULAR | Status: DC | PRN
  Administered 2023-11-09: 60 mg via INTRAVENOUS

## 2023-11-09 MED ORDER — SPIRONOLACTONE 25 MG PO TABS
50.0000 mg | ORAL_TABLET | Freq: Every day | ORAL | Status: DC
  Filled 2023-11-09: qty 2

## 2023-11-09 MED ORDER — THROMBIN 20000 UNITS EX SOLR
CUTANEOUS | Status: AC
  Filled 2023-11-09: qty 20000

## 2023-11-09 MED ORDER — DIAZEPAM 5 MG PO TABS
5.0000 mg | ORAL_TABLET | Freq: Four times a day (QID) | ORAL | Status: DC | PRN
  Administered 2023-11-09 – 2023-11-10 (×3): 5 mg via ORAL
  Filled 2023-11-09 (×3): qty 1

## 2023-11-09 MED ORDER — VITAMIN C 500 MG PO TABS
1000.0000 mg | ORAL_TABLET | Freq: Every day | ORAL | Status: DC
Start: 2023-11-10 — End: 2023-11-10
  Administered 2023-11-10: 1000 mg via ORAL
  Filled 2023-11-09: qty 2

## 2023-11-09 MED ORDER — ROCURONIUM BROMIDE 10 MG/ML (PF) SYRINGE
PREFILLED_SYRINGE | INTRAVENOUS | Status: DC | PRN
  Administered 2023-11-09: 50 mg via INTRAVENOUS
  Administered 2023-11-09: 20 mg via INTRAVENOUS
  Administered 2023-11-09: 10 mg via INTRAVENOUS

## 2023-11-09 MED ORDER — SUGAMMADEX SODIUM 200 MG/2ML IV SOLN
INTRAVENOUS | Status: AC
  Filled 2023-11-09: qty 2

## 2023-11-09 MED ORDER — PHENYLEPHRINE HCL-NACL 20-0.9 MG/250ML-% IV SOLN
INTRAVENOUS | Status: DC | PRN
  Administered 2023-11-09: 20 ug/min via INTRAVENOUS

## 2023-11-09 MED ORDER — PROPOFOL 10 MG/ML IV BOLUS
INTRAVENOUS | Status: AC
  Filled 2023-11-09: qty 20

## 2023-11-09 MED ORDER — FLUTICASONE PROPIONATE 50 MCG/ACT NA SUSP
1.0000 | Freq: Every day | NASAL | Status: DC | PRN
  Filled 2023-11-09: qty 16

## 2023-11-09 MED ORDER — ALBUMIN HUMAN 5 % IV SOLN: INTRAVENOUS | Status: DC | PRN

## 2023-11-09 MED ORDER — MAGNESIUM CITRATE PO SOLN
1.0000 | Freq: Once | ORAL | Status: DC | PRN
  Filled 2023-11-09: qty 296

## 2023-11-09 MED ORDER — OXYCODONE HCL 5 MG PO TABS: 5.0000 mg | ORAL_TABLET | Freq: Once | ORAL | Status: DC | PRN

## 2023-11-09 MED ORDER — LACTATED RINGERS IV SOLN: INTRAVENOUS | Status: DC

## 2023-11-09 MED ORDER — CELECOXIB 200 MG PO CAPS
200.0000 mg | ORAL_CAPSULE | Freq: Two times a day (BID) | ORAL | Status: DC
  Administered 2023-11-09 – 2023-11-10 (×3): 200 mg via ORAL
  Filled 2023-11-09 (×3): qty 1

## 2023-11-09 MED ORDER — ONDANSETRON HCL 4 MG/2ML IJ SOLN
INTRAMUSCULAR | Status: DC | PRN
  Administered 2023-11-09: 4 mg via INTRAVENOUS

## 2023-11-09 MED ORDER — OXYCODONE HCL 5 MG PO TABS
10.0000 mg | ORAL_TABLET | ORAL | Status: DC | PRN
  Administered 2023-11-09 – 2023-11-10 (×4): 10 mg via ORAL
  Filled 2023-11-09 (×5): qty 2

## 2023-11-09 MED ORDER — SODIUM CHLORIDE 0.9% FLUSH: 3.0000 mL | INTRAVENOUS | Status: DC | PRN

## 2023-11-09 MED ORDER — CHLORHEXIDINE GLUCONATE CLOTH 2 % EX PADS: 6.0000 | MEDICATED_PAD | Freq: Once | CUTANEOUS | Status: DC

## 2023-11-09 MED ORDER — SODIUM CHLORIDE 0.9% FLUSH
3.0000 mL | Freq: Two times a day (BID) | INTRAVENOUS | Status: DC
  Administered 2023-11-09 (×2): 3 mL via INTRAVENOUS

## 2023-11-09 MED ORDER — SUGAMMADEX SODIUM 200 MG/2ML IV SOLN
INTRAVENOUS | Status: DC | PRN
  Administered 2023-11-09 (×2): 100 mg via INTRAVENOUS

## 2023-11-09 MED ORDER — LIDOCAINE-EPINEPHRINE 0.5 %-1:200000 IJ SOLN
INTRAMUSCULAR | Status: AC
  Filled 2023-11-09: qty 50

## 2023-11-09 MED ORDER — MENTHOL 3 MG MT LOZG: 1.0000 | LOZENGE | OROMUCOSAL | Status: DC | PRN

## 2023-11-09 MED ORDER — FLUOXETINE HCL 20 MG PO CAPS
20.0000 mg | ORAL_CAPSULE | Freq: Every day | ORAL | Status: DC
  Administered 2023-11-10: 20 mg via ORAL
  Filled 2023-11-09: qty 1

## 2023-11-09 MED ORDER — ONDANSETRON HCL 4 MG/2ML IJ SOLN
INTRAMUSCULAR | Status: AC
  Filled 2023-11-09: qty 2

## 2023-11-09 MED ORDER — GABAPENTIN 300 MG PO CAPS: 300.0000 mg | ORAL_CAPSULE | Freq: Every day | ORAL | Status: DC | PRN

## 2023-11-09 MED ORDER — PHENOL 1.4 % MT LIQD: 1.0000 | OROMUCOSAL | Status: DC | PRN

## 2023-11-09 MED ORDER — DEXAMETHASONE SODIUM PHOSPHATE 10 MG/ML IJ SOLN
INTRAMUSCULAR | Status: DC | PRN
  Administered 2023-11-09: 10 mg via INTRAVENOUS

## 2023-11-09 MED ORDER — OXYCODONE HCL ER 10 MG PO T12A
10.0000 mg | EXTENDED_RELEASE_TABLET | Freq: Two times a day (BID) | ORAL | Status: DC
  Administered 2023-11-09 – 2023-11-10 (×3): 10 mg via ORAL
  Filled 2023-11-09 (×3): qty 1

## 2023-11-09 MED ORDER — INSULIN ASPART 100 UNIT/ML IJ SOLN: 0.0000 [IU] | INTRAMUSCULAR | Status: DC | PRN

## 2023-11-09 MED ORDER — ALLOPURINOL 100 MG PO TABS
100.0000 mg | ORAL_TABLET | Freq: Two times a day (BID) | ORAL | Status: DC
  Administered 2023-11-09: 100 mg via ORAL
  Filled 2023-11-09 (×2): qty 1

## 2023-11-09 MED ORDER — OXYCODONE HCL 5 MG/5ML PO SOLN: 5.0000 mg | Freq: Once | ORAL | Status: DC | PRN

## 2023-11-09 MED ORDER — EPHEDRINE SULFATE-NACL 50-0.9 MG/10ML-% IV SOSY
PREFILLED_SYRINGE | INTRAVENOUS | Status: DC | PRN
  Administered 2023-11-09: 10 mg via INTRAVENOUS
  Administered 2023-11-09: 5 mg via INTRAVENOUS

## 2023-11-09 MED ORDER — FENTANYL CITRATE (PF) 250 MCG/5ML IJ SOLN
INTRAMUSCULAR | Status: AC
  Filled 2023-11-09: qty 5

## 2023-11-09 MED ORDER — OYSTER SHELL CALCIUM/D3 500-5 MG-MCG PO TABS
1.0000 | ORAL_TABLET | Freq: Every day | ORAL | Status: DC
  Administered 2023-11-10: 1 via ORAL
  Filled 2023-11-09: qty 1

## 2023-11-09 MED ORDER — OXYCODONE HCL 5 MG PO TABS: 5.0000 mg | ORAL_TABLET | ORAL | Status: DC | PRN

## 2023-11-09 MED ORDER — SENNOSIDES-DOCUSATE SODIUM 8.6-50 MG PO TABS: 1.0000 | ORAL_TABLET | Freq: Every evening | ORAL | Status: DC | PRN

## 2023-11-09 MED ORDER — POTASSIUM CHLORIDE IN NACL 20-0.9 MEQ/L-% IV SOLN
INTRAVENOUS | Status: DC
  Filled 2023-11-09: qty 1000

## 2023-11-09 MED ORDER — VENLAFAXINE HCL ER 37.5 MG PO CP24
37.5000 mg | ORAL_CAPSULE | Freq: Every day | ORAL | Status: DC
  Administered 2023-11-10: 37.5 mg via ORAL
  Filled 2023-11-09: qty 1

## 2023-11-09 MED ORDER — RISAQUAD PO CAPS
1.0000 | ORAL_CAPSULE | Freq: Every day | ORAL | Status: DC
  Filled 2023-11-09: qty 1

## 2023-11-09 MED ORDER — ONDANSETRON HCL 4 MG/2ML IJ SOLN: 4.0000 mg | Freq: Four times a day (QID) | INTRAMUSCULAR | Status: DC | PRN

## 2023-11-09 MED ORDER — MORPHINE SULFATE (PF) 2 MG/ML IV SOLN: 2.0000 mg | INTRAVENOUS | Status: DC | PRN

## 2023-11-09 MED ORDER — SUCCINYLCHOLINE CHLORIDE 200 MG/10ML IV SOSY
PREFILLED_SYRINGE | INTRAVENOUS | Status: AC
  Filled 2023-11-09: qty 10

## 2023-11-09 MED ORDER — SODIUM CHLORIDE 0.9 % IV SOLN
250.0000 mL | INTRAVENOUS | Status: AC
  Administered 2023-11-09: 250 mL via INTRAVENOUS

## 2023-11-09 MED ORDER — HEPARIN SODIUM (PORCINE) 5000 UNIT/ML IJ SOLN
5000.0000 [IU] | Freq: Three times a day (TID) | INTRAMUSCULAR | Status: DC
  Administered 2023-11-10: 5000 [IU] via SUBCUTANEOUS
  Filled 2023-11-09: qty 1

## 2023-11-09 MED ORDER — SENNA 8.6 MG PO TABS
1.0000 | ORAL_TABLET | Freq: Two times a day (BID) | ORAL | Status: DC
  Administered 2023-11-09 – 2023-11-10 (×2): 8.6 mg via ORAL
  Filled 2023-11-09 (×2): qty 1

## 2023-11-09 MED ORDER — METFORMIN HCL ER 500 MG PO TB24
500.0000 mg | ORAL_TABLET | Freq: Every day | ORAL | Status: DC
  Administered 2023-11-10: 500 mg via ORAL
  Filled 2023-11-09: qty 1

## 2023-11-09 MED ORDER — PROPOFOL 10 MG/ML IV BOLUS
INTRAVENOUS | Status: DC | PRN
  Administered 2023-11-09: 150 mg via INTRAVENOUS

## 2023-11-09 MED ORDER — FUROSEMIDE 20 MG PO TABS
20.0000 mg | ORAL_TABLET | Freq: Every day | ORAL | Status: DC
Start: 2023-11-10 — End: 2023-11-10
  Filled 2023-11-09: qty 1

## 2023-11-09 MED ORDER — HYDROMORPHONE HCL 1 MG/ML IJ SOLN
INTRAMUSCULAR | Status: AC
  Filled 2023-11-09: qty 0.5

## 2023-11-09 MED ORDER — DEXMEDETOMIDINE HCL IN NACL 80 MCG/20ML IV SOLN
INTRAVENOUS | Status: DC | PRN
  Administered 2023-11-09: 4 ug via INTRAVENOUS
  Administered 2023-11-09: 8 ug via INTRAVENOUS
  Administered 2023-11-09: 4 ug via INTRAVENOUS

## 2023-11-09 MED ORDER — PHENYLEPHRINE 80 MCG/ML (10ML) SYRINGE FOR IV PUSH (FOR BLOOD PRESSURE SUPPORT)
PREFILLED_SYRINGE | INTRAVENOUS | Status: DC | PRN
  Administered 2023-11-09 (×3): 80 ug via INTRAVENOUS
  Administered 2023-11-09: 160 ug via INTRAVENOUS
  Administered 2023-11-09 (×2): 80 ug via INTRAVENOUS

## 2023-11-09 MED ORDER — ADULT MULTIVITAMIN W/MINERALS CH
1.0000 | ORAL_TABLET | Freq: Every day | ORAL | Status: DC
  Administered 2023-11-10: 1 via ORAL
  Filled 2023-11-09: qty 1

## 2023-11-09 MED ORDER — LIDOCAINE 2% (20 MG/ML) 5 ML SYRINGE
INTRAMUSCULAR | Status: AC
  Filled 2023-11-09: qty 5

## 2023-11-09 MED ORDER — THROMBIN 20000 UNITS EX SOLR
CUTANEOUS | Status: DC | PRN
  Administered 2023-11-09: 20 mL via TOPICAL

## 2023-11-09 MED ORDER — CARVEDILOL 3.125 MG PO TABS
3.1250 mg | ORAL_TABLET | Freq: Two times a day (BID) | ORAL | Status: DC
  Administered 2023-11-09 – 2023-11-10 (×2): 3.125 mg via ORAL
  Filled 2023-11-09 (×2): qty 1

## 2023-11-09 MED ORDER — CHLORHEXIDINE GLUCONATE 0.12 % MT SOLN
15.0000 mL | Freq: Once | OROMUCOSAL | Status: AC
  Administered 2023-11-09: 15 mL via OROMUCOSAL
  Filled 2023-11-09: qty 15

## 2023-11-09 MED ORDER — VITAMIN D 25 MCG (1000 UNIT) PO TABS
1000.0000 [IU] | ORAL_TABLET | Freq: Every day | ORAL | Status: DC
Start: 2023-11-10 — End: 2023-11-10
  Administered 2023-11-10: 1000 [IU] via ORAL
  Filled 2023-11-09: qty 1

## 2023-11-09 MED ORDER — FENTANYL CITRATE (PF) 250 MCG/5ML IJ SOLN
INTRAMUSCULAR | Status: DC | PRN
  Administered 2023-11-09 (×2): 50 ug via INTRAVENOUS
  Administered 2023-11-09: 100 ug via INTRAVENOUS
  Administered 2023-11-09: 50 ug via INTRAVENOUS

## 2023-11-09 MED ORDER — HYDROMORPHONE HCL 1 MG/ML IJ SOLN: 0.2500 mg | INTRAMUSCULAR | Status: DC | PRN

## 2023-11-09 MED ORDER — BISACODYL 5 MG PO TBEC: 5.0000 mg | DELAYED_RELEASE_TABLET | Freq: Every day | ORAL | Status: DC | PRN

## 2023-11-09 MED ORDER — HYDROMORPHONE HCL 1 MG/ML IJ SOLN
INTRAMUSCULAR | Status: DC | PRN
  Administered 2023-11-09: .5 mg via INTRAVENOUS

## 2023-11-09 MED ORDER — BUPIVACAINE HCL (PF) 0.5 % IJ SOLN
INTRAMUSCULAR | Status: AC
  Filled 2023-11-09: qty 30

## 2023-11-09 MED ORDER — 0.9 % SODIUM CHLORIDE (POUR BTL) OPTIME
TOPICAL | Status: DC | PRN
  Administered 2023-11-09: 1000 mL

## 2023-11-09 MED ORDER — ONDANSETRON HCL 4 MG PO TABS: 4.0000 mg | ORAL_TABLET | Freq: Four times a day (QID) | ORAL | Status: DC | PRN

## 2023-11-09 MED ORDER — BUPIVACAINE HCL (PF) 0.5 % IJ SOLN
INTRAMUSCULAR | Status: DC | PRN
  Administered 2023-11-09: 30 mL

## 2023-11-09 MED ORDER — LIDOCAINE-EPINEPHRINE 0.5 %-1:200000 IJ SOLN
INTRAMUSCULAR | Status: DC | PRN
  Administered 2023-11-09: 8 mL
  Administered 2023-11-09: 10 mL
  Administered 2023-11-09: 5 mL

## 2023-11-09 SURGICAL SUPPLY — 56 items
BAG COUNTER SPONGE SURGICOUNT (BAG) ×1 IMPLANT
BASKET BONE COLLECTION (BASKET) IMPLANT
BENZOIN TINCTURE PRP APPL 2/3 (GAUZE/BANDAGES/DRESSINGS) IMPLANT
BLADE BONE MILL MEDIUM (MISCELLANEOUS) IMPLANT
BLADE CLIPPER SURG (BLADE) IMPLANT
BUR MATCHSTICK NEURO 3.0 LAGG (BURR) ×1 IMPLANT
BUR PRECISION FLUTE 5.0 (BURR) IMPLANT
CANISTER SUCTION 3000ML PPV (SUCTIONS) ×1 IMPLANT
DERMABOND ADVANCED .7 DNX12 (GAUZE/BANDAGES/DRESSINGS) ×1 IMPLANT
DRAPE C-ARM 42X72 X-RAY (DRAPES) ×2 IMPLANT
DRAPE C-ARMOR (DRAPES) IMPLANT
DRAPE HALF SHEET 40X57 (DRAPES) IMPLANT
DRAPE LAPAROTOMY 100X72X124 (DRAPES) ×1 IMPLANT
DRAPE MICROSCOPE SLANT 54X150 (MISCELLANEOUS) ×1 IMPLANT
DRAPE SURG 17X23 STRL (DRAPES) ×1 IMPLANT
DRSG OPSITE POSTOP 4X6 (GAUZE/BANDAGES/DRESSINGS) IMPLANT
DURAPREP 26ML APPLICATOR (WOUND CARE) ×1 IMPLANT
ELECTRODE REM PT RTRN 9FT ADLT (ELECTROSURGICAL) ×1 IMPLANT
GAUZE 4X4 16PLY ~~LOC~~+RFID DBL (SPONGE) IMPLANT
GAUZE SPONGE 4X4 12PLY STRL (GAUZE/BANDAGES/DRESSINGS) IMPLANT
GLOVE BIOGEL PI IND STRL 7.5 (GLOVE) IMPLANT
GLOVE ECLIPSE 6.5 STRL STRAW (GLOVE) ×1 IMPLANT
GLOVE EXAM NITRILE XL STR (GLOVE) IMPLANT
GLOVE SURG SS PI 7.0 STRL IVOR (GLOVE) IMPLANT
GOWN STRL REUS W/ TWL LRG LVL3 (GOWN DISPOSABLE) ×2 IMPLANT
GOWN STRL REUS W/ TWL XL LVL3 (GOWN DISPOSABLE) IMPLANT
GOWN STRL REUS W/TWL 2XL LVL3 (GOWN DISPOSABLE) IMPLANT
GUIDEWIRE NITINOL BEVEL TIP (WIRE) IMPLANT
KIT BASIN OR (CUSTOM PROCEDURE TRAY) ×1 IMPLANT
KIT TURNOVER KIT B (KITS) ×1 IMPLANT
NDL HYPO 25X1 1.5 SAFETY (NEEDLE) ×1 IMPLANT
NDL I-PASS III (NEEDLE) IMPLANT
NDL SPNL 18GX3.5 QUINCKE PK (NEEDLE) IMPLANT
NEEDLE HYPO 25X1 1.5 SAFETY (NEEDLE) ×1 IMPLANT
NEEDLE I-PASS III (NEEDLE) ×2 IMPLANT
NEEDLE SPNL 18GX3.5 QUINCKE PK (NEEDLE) IMPLANT
NS IRRIG 1000ML POUR BTL (IV SOLUTION) ×1 IMPLANT
PACK LAMINECTOMY NEURO (CUSTOM PROCEDURE TRAY) ×1 IMPLANT
PAD ARMBOARD POSITIONER FOAM (MISCELLANEOUS) ×3 IMPLANT
ROD RELINE TI MAS 5.5X70MM LD (Rod) IMPLANT
ROD RELINE TI MAS LD 5.5X65 (Rod) IMPLANT
SCREW LOCK RELINE 5.5 TULIP (Screw) IMPLANT
SCREW RELINE MAS POLY 6.5X40MM (Screw) IMPLANT
SCREW RELINE RED 6.5X50MM POLY (Screw) IMPLANT
SPIKE FLUID TRANSFER (MISCELLANEOUS) ×1 IMPLANT
SPONGE SURGIFOAM ABS GEL 100 (HEMOSTASIS) IMPLANT
SPONGE SURGIFOAM ABS GEL SZ50 (HEMOSTASIS) ×1 IMPLANT
SPONGE T-LAP 4X18 ~~LOC~~+RFID (SPONGE) IMPLANT
STRIP CLOSURE SKIN 1/2X4 (GAUZE/BANDAGES/DRESSINGS) IMPLANT
SUT VIC AB 0 CT1 18XCR BRD8 (SUTURE) ×1 IMPLANT
SUT VIC AB 2-0 CT1 18 (SUTURE) ×1 IMPLANT
SUT VIC AB 3-0 SH 8-18 (SUTURE) ×1 IMPLANT
TOWEL GREEN STERILE (TOWEL DISPOSABLE) ×1 IMPLANT
TOWEL GREEN STERILE FF (TOWEL DISPOSABLE) ×1 IMPLANT
TRAY FOLEY MTR SLVR 14FR STAT (SET/KITS/TRAYS/PACK) IMPLANT
WATER STERILE IRR 1000ML POUR (IV SOLUTION) ×1 IMPLANT

## 2023-11-09 NOTE — Anesthesia Postprocedure Evaluation (Signed)
 Anesthesia Post Note  Patient: Darren Gay  Procedure(s) Performed: POSTERIOR LATERAL ARTHRODESIS LUMBAR FUSION WITH PERCUTANEOUS SCREW FIXATION LUMBAR THREE AND LUMBAR FIVE; DECOMPRESSION LUMBAR THREE-FOUR     Patient location during evaluation: PACU Anesthesia Type: General Level of consciousness: awake and alert Pain management: pain level controlled Vital Signs Assessment: post-procedure vital signs reviewed and stable Respiratory status: spontaneous breathing, nonlabored ventilation, respiratory function stable and patient connected to nasal cannula oxygen Cardiovascular status: blood pressure returned to baseline and stable Postop Assessment: no apparent nausea or vomiting Anesthetic complications: no   No notable events documented.  Last Vitals:  Vitals:   11/09/23 1200 11/09/23 1235  BP: 112/73 128/77  Pulse: 78 75  Resp: 19 18  Temp:  36.5 C  SpO2: 94% 96%    Last Pain:  Vitals:   11/09/23 1147  TempSrc:   PainSc: 3                  Sophie Tamez S

## 2023-11-09 NOTE — H&P (Signed)
 BP 122/78   Pulse 69   Temp 98.4 F (36.9 C) (Oral)   Resp 18   Ht 5' 11 (1.803 m)   Wt 83.9 kg   SpO2 94%   BMI 25.80 kg/m  Darren Gay returns today with an MRI of the lumbar spine.  Darren Gay had a kyphoplasty done at L4, which is collapsed.  He is markedly stenotic at 3 through 4 just through the 3-4 disc and then the upper portion of the vertebral body at L4.  He does have symptoms consistent with neurogenic claudication, and I think he needs to be decompressed.  There has been no further collapse of the L4 vertebral body, which is about 85% collapsed anyway.  However, I am wary of doing a decompression without there being some hardware to fuse that level or at the very least stabilize it as a result of the fracture.  The fracture also is not healed completely, and that may very well be due to the fact that he is still having the pain.  We will go ahead and get this scheduled.  Darren Gay is going on a family trip July 20.  I told him I certainly can do it.  I would want a week in between the operation and the trip, but if he wanted to wait until after the trip, I think that is okay too.  None of this is life-threatening. Allergies  Allergen Reactions   Acetaminophen  Other (See Comments)    Avoid due to cirrhosis of liver   Ciprofloxacin     Hx tendon rupture   Colchicine  Other (See Comments)    makes me fatigue   Past Surgical History:  Procedure Laterality Date   ANKLE SURGERY     tendon repair   BUNIONECTOMY     CARPAL TUNNEL RELEASE Bilateral    COLONOSCOPY     heart ablation  01/26/2023   IR PARACENTESIS  02/24/2023   KNEE ARTHROSCOPY  2010   kyphoplasty  08/2023   LYMPHADENECTOMY Bilateral 06/21/2019   Procedure: LYMPHADENECTOMY, PELVIC;  Surgeon: Renda Glance, MD;  Location: [facility];  Service: Urology;  Laterality: Bilateral;   ROBOT ASSISTED LAPAROSCOPIC RADICAL PROSTATECTOMY N/A 06/21/2019   Procedure: XI ROBOTIC ASSISTED LAPAROSCOPIC RADICAL PROSTATECTOMY LEVEL 2;   Surgeon: Renda Glance, MD;  Location: [facility];  Service: Urology;  Laterality: N/A;   TONSILLECTOMY  1960   VASECTOMY  1991   Past Medical History:  Diagnosis Date   Alcoholic cirrhosis of liver (HCC)    Allergy    Arthritis    Atrial fibrillation (HCC)    Cancer (HCC)    prostate cancer   Depression    Diabetes mellitus without complication (HCC)    Dysrhythmia    Gout    High cholesterol    borderline   Hx of adenomatous colonic polyps 03/29/2014   Hypertension    Pulmonary nodules    Sleep apnea    wears cpap   Substance abuse (HCC)    Social History   Socioeconomic History   Marital status: Married    Spouse name: Dorothe   Number of children: 3   Years of education: 4y Trade   Highest education level: Not on file  Occupational History    Employer: [employer]    Comment: [employer]  Tobacco Use   Smoking status: Former    Current packs/day: 2.00    Average packs/day: 2.0 packs/day for 14.0 years (28.0 ttl pk-yrs)    Types: Cigarettes  Smokeless tobacco: Never  Vaping Use   Vaping status: Never Used  Substance and Sexual Activity   Alcohol use: Not Currently    Alcohol/week: 21.0 standard drinks of alcohol    Types: 21 Cans of beer per week    Comment: 2-3 shots of liq   Drug use: No   Sexual activity: Yes    Birth control/protection: None  Other Topics Concern   Not on file  Social History Narrative   Married to Lamont    2 daughters   pt lives at home with his family.Caffeine Use: 1 cup daily   4 shots of whiskey or more daily times years   Former smoker no current tobacco or drug use   Social Drivers of Corporate investment banker Strain: Low Risk  (05/17/2023)   Received from [facility]   Overall Financial Resource Strain (CARDIA)    Difficulty of Paying Living Expenses: Not very hard  Food Insecurity: Low Risk  (06/24/2023)   Received from [facility]   Hunger Vital Sign    Within the past 12 months, you worried that your food  would run out before you got money to buy more: Never true    Within the past 12 months, the food you bought just didn't last and you didn't have money to get more. : Never true  Transportation Needs: No Transportation Needs (06/24/2023)   Received from [facility]    In the past 12 months, has lack of reliable transportation kept you from medical appointments, meetings, work or from getting things needed for daily living? : No  Physical Activity: Unknown (05/17/2023)   Received from [facility]   Exercise Vital Sign    On average, how many days per week do you engage in moderate to strenuous exercise (like a brisk walk)?: Patient declined    Minutes of Exercise per Session: Not on file  Stress: No Stress Concern Present (05/17/2023)   Received from [facility] - Occupational Stress Questionnaire    Feeling of Stress : Not at all  Recent Concern: Stress - Stress Concern Present (02/20/2023)   Received from [facility] of Occupational Health - Occupational Stress Questionnaire    Feeling of Stress : To some extent  Social Connections: Patient Declined (05/17/2023)   Received from [facility]   Social Network    How would you rate your social network (family, work, friends)?: Patient declined  Intimate Partner Violence: Not At Risk (02/20/2023)   Received from [facility]   HITS    Over the last 12 months how often did your partner physically hurt you?: Never    Over the last 12 months how often did your partner insult you or talk down to you?: Rarely    Over the last 12 months how often did your partner threaten you with physical harm?: Never    Over the last 12 months how often did your partner scream or curse at you?: Never   Family History  Problem Relation Age of Onset   Skin cancer Father    Hypertension Father    Breast cancer Mother    Stomach cancer Mother    Pancreatic cancer Brother     Colon cancer Neg Hx    Esophageal cancer Neg Hx    Rectal cancer Neg Hx    Colon polyps Neg Hx    Physical Exam Constitutional:  Appearance: Normal appearance. He is obese.  HENT:     Head: Normocephalic and atraumatic.     Nose: Nose normal.     Mouth/Throat:     Mouth: Mucous membranes are moist.     Pharynx: Oropharynx is clear.  Eyes:     Extraocular Movements: Extraocular movements intact.     Conjunctiva/sclera: Conjunctivae normal.     Pupils: Pupils are equal, round, and reactive to light.  Cardiovascular:     Rate and Rhythm: Normal rate and regular rhythm.  Pulmonary:     Effort: Pulmonary effort is normal.     Breath sounds: Normal breath sounds.  Abdominal:     General: Abdomen is flat.     Palpations: Abdomen is soft.  Musculoskeletal:        General: Normal range of motion.     Cervical back: Normal range of motion.  Skin:    General: Skin is warm and dry.  Neurological:     General: No focal deficit present.     Mental Status: He is alert and oriented to person, place, and time.     Sensory: No sensory deficit.     Motor: No weakness.     Coordination: Coordination normal.     Gait: Gait normal.     Deep Tendon Reflexes: Reflexes normal.  Psychiatric:        Mood and Affect: Mood normal.        Behavior: Behavior normal.        Thought Content: Thought content normal.        Judgment: Judgment normal.    OR for lumbar fusion and decompression

## 2023-11-09 NOTE — Op Note (Signed)
 11/09/2023  11:29 AM  PATIENT:  Darren Gay  73 y.o. male  PRE-OPERATIVE DIAGNOSIS:  Lumbar stenosis with neurogenic claudication L3/4 Lumbar spondylolisthesis L4/5 POST-OPERATIVE DIAGNOSIS:  Lumbar stenosis with neurogenic claudication  PROCEDURE:  Procedure(s): POSTERIOR LATERAL ARTHRODESIS L3-5  PERCUTANEOUS pedicle SCREW FIXATION LUMBAR THREE AND LUMBAR FIVE;  DECOMPRESSION LUMBAR four laminectomy  SURGEON:  Surgeon(s): Gillie Duncans, MD  ASSISTANTS:none  ANESTHESIA:   general  EBL:  Total I/O In: 1150 [I.V.:900; IV Piggyback:250] Out: 295 [Urine:170; Blood:125]  BLOOD ADMINISTERED:none  CELL SAVER GIVEN:none  COUNT:per nursing  DRAINS: Urinary Catheter (Foley)   SPECIMEN:  No Specimen  DICTATION: Darren Gay is a 73 y.o. male whom was taken to the operating room intubated, and placed under a general anesthetic without difficulty. A foley catheter was placed under sterile conditions. He was positioned prone on a Jackson table with all pressure points properly padded.  His lumbar region was prepped and draped in a sterile manner. I infiltrated 1/2%lidocaine /1:2000,000 strength epinephrine  into each planned percutaneous site . I opened the skin with a 10 blade at each site and with fluoroscopy guidance placed a Jamshidi needle into each pedicle at L3, and L5. I placed a K-wire through the needle into the vertebral body on each side. I then used cannulated 6.5x 40mm pedicle screws with attached towers and placed them at L3 and L5. I used on 50mm screw on the right at L5.  bilaterally. I confirmed good location with fluoroscopy. I placed the rods through the screw heads, confirmed good position with fluoroscopy. I locked the rods into the screw heads. I removed the towers then closed.  I closed the incisions with vicryl suture, and used Dermabond for a sterile dressing.  I opened in the midline to expose the lamina of L4 bilaterally. I used monopolar cautery to expose the  lamina and 3/4 facets on each side. I performed a complete L4 laminectomy with the drill and Kerrison punches. I removed the ligamentum flavum and decompressed the spinal canal at L3/4.  I decorticated the lateral bone at L3,4,and 5. I then placed autograft morsels on the decorticated surfaces to complete the posterolateral arthrodesis.  The locking caps were secured with torque limited screwdrivers. Final films were performed and the final construct appeared to be in good position.  I closed the wound in a layered fashion. I approximated the thoracolumbar fascia, subcutaneous, and subcuticular planes with vicryl sutures. I used dermabond, and an occlusive bandage for a sterile dressing.     PLAN OF CARE: Admit to inpatient   PATIENT DISPOSITION:  PACU - hemodynamically stable.   Delay start of Pharmacological VTE agent (>24hrs) due to surgical blood loss or risk of bleeding:  yes

## 2023-11-09 NOTE — Transfer of Care (Signed)
 Immediate Anesthesia Transfer of Care Note  Patient: Darren Gay  Procedure(s) Performed: POSTERIOR LATERAL ARTHRODESIS LUMBAR FUSION WITH PERCUTANEOUS SCREW FIXATION LUMBAR THREE AND LUMBAR FIVE; DECOMPRESSION LUMBAR THREE-FOUR  Patient Location: PACU  Anesthesia Type:General  Level of Consciousness: awake, alert , oriented, patient cooperative, and responds to stimulation  Airway & Oxygen Therapy: Patient Spontanous Breathing and Patient connected to face mask oxygen  Post-op Assessment: Report given to RN, Post -op Vital signs reviewed and stable, and Patient moving all extremities X 4  Post vital signs: Reviewed and stable  Last Vitals:  Vitals Value Taken Time  BP 135/90 11/09/23 11:17  Temp    Pulse 83 11/09/23 11:23  Resp 14 11/09/23 11:23  SpO2 92 % 11/09/23 11:23  Vitals shown include unfiled device data.  Last Pain:  Vitals:   11/09/23 0646  TempSrc:   PainSc: 0-No pain         Complications: No notable events documented.

## 2023-11-09 NOTE — Anesthesia Procedure Notes (Signed)
 Procedure Name: Intubation Date/Time: 11/09/2023 8:21 AM  Performed by: Maryclare Cornet, MDPre-anesthesia Checklist: Patient identified, Emergency Drugs available, Suction available and Patient being monitored Patient Re-evaluated:Patient Re-evaluated prior to induction Oxygen Delivery Method: Circle system utilized Preoxygenation: Pre-oxygenation with 100% oxygen Induction Type: IV induction Ventilation: Two handed mask ventilation required Laryngoscope Size: Mac and 4 Grade View: Grade I Tube type: Oral Tube size: 7.5 mm Number of attempts: 1 Airway Equipment and Method: Stylet and Oral airway Placement Confirmation: ETT inserted through vocal cords under direct vision, positive ETCO2 and breath sounds checked- equal and bilateral Secured at: 23 cm Tube secured with: Tape Dental Injury: Teeth and Oropharynx as per pre-operative assessment

## 2023-11-10 ENCOUNTER — Encounter (HOSPITAL_COMMUNITY): Payer: Self-pay | Admitting: Neurosurgery

## 2023-11-10 DIAGNOSIS — M48062 Spinal stenosis, lumbar region with neurogenic claudication: Secondary | ICD-10-CM | POA: Diagnosis not present

## 2023-11-10 LAB — GLUCOSE, CAPILLARY: Glucose-Capillary: 149 mg/dL — ABNORMAL HIGH (ref 70–99)

## 2023-11-10 MED ORDER — TIZANIDINE HCL 4 MG PO TABS: 4.0000 mg | ORAL_TABLET | Freq: Four times a day (QID) | ORAL | 0 refills | Status: AC | PRN

## 2023-11-10 MED ORDER — OXYCODONE HCL 10 MG PO TABS: 10.0000 mg | ORAL_TABLET | Freq: Four times a day (QID) | ORAL | 0 refills | Status: AC | PRN

## 2023-11-10 NOTE — Discharge Instructions (Signed)

## 2023-11-10 NOTE — Evaluation (Addendum)
 Occupational Therapy Evaluation Patient Details Name: Darren Gay MRN: [id number] DOB: May 19, 1950 Today's Date: 11/10/2023   History of Present Illness   Pt is a 73 y/o male presenting on 7/30 for posterior lateral arthrodesis L3-5, PLIF L3 and L5. PMH includes: alcoholic cirrhosis of liver, arthritis, afib, prostate cancer, DM2, gout, HTN.     Clinical Impressions Patient admitted for above and presents with problem list below.  PTA pt was independent using walking stick as needed. Patient was educated on back precautions, ADL compensatory techniques, AE/DME, mobility progression, safety and recommendations.  Today, pt demonstrated ability to complete bed mobility with modified independence, transfers using RW with modified independence, functional mobility using RW with modified independence, and ADLs with modified independence.  Intermittent cueing initially to avoid twisting but overall good adherence to back precautions.  At discharge, pt will have support from spouse as needed.  Based on performance today, no further OT needs identified.  OT will sign off.       If plan is discharge home, recommend the following:   Assistance with cooking/housework;Assist for transportation;Help with stairs or ramp for entrance     Functional Status Assessment   Patient has not had a recent decline in their functional status     Equipment Recommendations   None recommended by OT     Recommendations for Other Services         Precautions/Restrictions   Precautions Precautions: Fall;Back Precaution Booklet Issued: Yes (comment) Recall of Precautions/Restrictions: Intact Precaution/Restrictions Comments: reviewed with pt, intermittent cueing to adhere functionally Required Braces or Orthoses:  (no brace needed order) Restrictions Weight Bearing Restrictions Per Provider Order: No     Mobility Bed Mobility Overal bed mobility: Modified Independent             General  bed mobility comments: educated on log roll technique, good carryover    Transfers Overall transfer level: Modified independent Equipment used: Rolling walker (2 wheels)               General transfer comment: good posture and technique, cueing for hand placement off commode      Balance Overall balance assessment: Mild deficits observed, not formally tested                                         ADL either performed or assessed with clinical judgement   ADL Overall ADL's : Modified independent                                       General ADL Comments: educated on compensatory techniques for ADLs to adhere to back precautions.  intermittent cueing to adhere to back precautions (twisting)     Vision   Vision Assessment?: No apparent visual deficits     Perception         Praxis         Pertinent Vitals/Pain Pain Assessment Pain Assessment: Faces Faces Pain Scale: Hurts a little bit Pain Location: back- incisonal Pain Descriptors / Indicators: Discomfort, Operative site guarding Pain Intervention(s): Limited activity within patient's tolerance, Monitored during session, Repositioned     Extremity/Trunk Assessment Upper Extremity Assessment Upper Extremity Assessment: Overall WFL for tasks assessed   Lower Extremity Assessment Lower Extremity Assessment: Defer to PT evaluation   Cervical / Trunk Assessment  Cervical / Trunk Assessment: Back Surgery   Communication Communication Communication: No apparent difficulties   Cognition Arousal: Alert Behavior During Therapy: WFL for tasks assessed/performed Cognition: No apparent impairments                               Following commands: Intact       Cueing  General Comments   Cueing Techniques: Verbal cues  short distance mobility without RW, no LOB   Exercises     Shoulder Instructions      Home Living Family/patient expects to be discharged  to:: Private residence Living Arrangements: Spouse/significant other Available Help at Discharge: Family;Available 24 hours/day Type of Home: House Home Access: Stairs to enter [employer] of Steps: 5 Entrance Stairs-Rails: Can reach both Home Layout: One level     Bathroom Shower/Tub: Producer, television/film/video: Handicapped height Bathroom Accessibility: Yes How Accessible: Accessible via walker Home Equipment: Rolling Walker (2 wheels);Shower seat - built in;Other (comment);Adaptive equipment;Hand held shower head;Grab bars - tub/shower (walking stick) Adaptive Equipment: Reacher        Prior Functioning/Environment Prior Level of Function : Independent/Modified Independent                    OT Problem List: Decreased activity tolerance;Impaired balance (sitting and/or standing);Pain;Decreased knowledge of precautions;Decreased knowledge of use of DME or AE   OT Treatment/Interventions:        OT Goals(Current goals can be found in the care plan section)   Acute Rehab OT Goals Patient Stated Goal: home OT Goal Formulation: With patient Time For Goal Achievement: 11/24/23 Potential to Achieve Goals: Good   OT Frequency:       Co-evaluation              AM-PAC OT 6 Clicks Daily Activity     Outcome Measure Help from another person eating meals?: None Help from another person taking care of personal grooming?: None Help from another person toileting, which includes using toliet, bedpan, or urinal?: None Help from another person bathing (including washing, rinsing, drying)?: None Help from another person to put on and taking off regular upper body clothing?: None Help from another person to put on and taking off regular lower body clothing?: None 6 Click Score: 24   End of Session Equipment Utilized During Treatment: Rolling walker (2 wheels) Nurse Communication: Mobility status;Precautions  Activity Tolerance: Patient tolerated  treatment well Patient left: with call bell/phone within reach;in bed  OT Visit Diagnosis: Other abnormalities of gait and mobility (R26.89);Pain Pain - part of body:  (back- incisional)                Time: 9093-9073 OT Time Calculation (min): 20 min Charges:  OT General Charges $OT Visit: 1 Visit OT Evaluation $OT Eval Low Complexity: 1 Low  Etta NOVAK, OT Acute Rehabilitation Services Office [phone] Secure Chat Preferred    Etta GORMAN Hope 11/10/2023, 9:59 AM

## 2024-01-02 ENCOUNTER — Other Ambulatory Visit: Payer: Self-pay | Admitting: Nurse Practitioner

## 2024-01-02 DIAGNOSIS — F1090 Alcohol use, unspecified, uncomplicated: Secondary | ICD-10-CM

## 2024-01-02 DIAGNOSIS — R188 Other ascites: Secondary | ICD-10-CM

## 2024-01-02 DIAGNOSIS — K760 Fatty (change of) liver, not elsewhere classified: Secondary | ICD-10-CM

## 2024-01-11 ENCOUNTER — Ambulatory Visit
Admission: RE | Admit: 2024-01-11 | Discharge: 2024-01-11 | Disposition: A | Source: Ambulatory Visit | Attending: Nurse Practitioner

## 2024-01-11 DIAGNOSIS — K760 Fatty (change of) liver, not elsewhere classified: Secondary | ICD-10-CM

## 2024-01-11 DIAGNOSIS — F1090 Alcohol use, unspecified, uncomplicated: Secondary | ICD-10-CM

## 2024-01-11 DIAGNOSIS — R188 Other ascites: Secondary | ICD-10-CM
